# Patient Record
Sex: Male | Born: 1935 | Race: White | Hispanic: No | Marital: Married | Smoking: Never smoker
Health system: Southern US, Community
[De-identification: ages and names within clinical notes are randomized; demographics above are authoritative.]

## PROBLEM LIST (undated history)

## (undated) DIAGNOSIS — H53001 Unspecified amblyopia, right eye: Secondary | ICD-10-CM

## (undated) DIAGNOSIS — E785 Hyperlipidemia, unspecified: Secondary | ICD-10-CM

## (undated) DIAGNOSIS — H50111 Monocular exotropia, right eye: Secondary | ICD-10-CM

## (undated) DIAGNOSIS — D497 Neoplasm of unspecified behavior of endocrine glands and other parts of nervous system: Secondary | ICD-10-CM

## (undated) DIAGNOSIS — E039 Hypothyroidism, unspecified: Secondary | ICD-10-CM

## (undated) DIAGNOSIS — F410 Panic disorder [episodic paroxysmal anxiety] without agoraphobia: Secondary | ICD-10-CM

## (undated) DIAGNOSIS — H5010 Unspecified exotropia: Secondary | ICD-10-CM

## (undated) DIAGNOSIS — C801 Malignant (primary) neoplasm, unspecified: Secondary | ICD-10-CM

## (undated) HISTORY — PX: PROSTATECTOMY: SHX69

## (undated) HISTORY — PX: EYE SURGERY: SHX253

## (undated) HISTORY — PX: TONSILLECTOMY: SUR1361

## (undated) HISTORY — PX: COLONOSCOPY: SHX174

## (undated) HISTORY — PX: TRANSPHENOIDAL / TRANSNASAL HYPOPHYSECTOMY / RESECTION PITUITARY TUMOR: SUR1382

## (undated) HISTORY — PX: WISDOM TOOTH EXTRACTION: SHX21

---

## 1999-05-16 ENCOUNTER — Other Ambulatory Visit: Admission: RE | Admit: 1999-05-16 | Discharge: 1999-05-16 | Payer: Self-pay | Admitting: Urology

## 2002-02-11 ENCOUNTER — Encounter: Admission: RE | Admit: 2002-02-11 | Discharge: 2002-02-11 | Payer: Self-pay | Admitting: Internal Medicine

## 2002-02-11 ENCOUNTER — Encounter: Payer: Self-pay | Admitting: Internal Medicine

## 2005-11-05 ENCOUNTER — Encounter: Admission: RE | Admit: 2005-11-05 | Discharge: 2005-11-05 | Payer: Self-pay | Admitting: Otolaryngology

## 2009-04-08 ENCOUNTER — Inpatient Hospital Stay (HOSPITAL_COMMUNITY)
Admission: RE | Admit: 2009-04-08 | Discharge: 2009-04-15 | Payer: Self-pay | Admitting: Physical Medicine & Rehabilitation

## 2009-04-08 ENCOUNTER — Ambulatory Visit: Payer: Self-pay | Admitting: Physical Medicine & Rehabilitation

## 2009-04-09 ENCOUNTER — Ambulatory Visit: Payer: Self-pay | Admitting: Physical Medicine & Rehabilitation

## 2009-04-12 ENCOUNTER — Ambulatory Visit: Payer: Self-pay | Admitting: Psychology

## 2009-04-20 ENCOUNTER — Encounter
Admission: RE | Admit: 2009-04-20 | Discharge: 2009-07-19 | Payer: Self-pay | Admitting: Physical Medicine & Rehabilitation

## 2009-05-17 ENCOUNTER — Inpatient Hospital Stay (HOSPITAL_COMMUNITY): Admission: EM | Admit: 2009-05-17 | Discharge: 2009-05-20 | Payer: Self-pay | Admitting: Emergency Medicine

## 2011-02-25 LAB — BASIC METABOLIC PANEL
BUN: 12 mg/dL (ref 6–23)
CO2: 26 mEq/L (ref 19–32)
Chloride: 111 mEq/L (ref 96–112)
Glucose, Bld: 93 mg/dL (ref 70–99)
Potassium: 3.3 mEq/L — ABNORMAL LOW (ref 3.5–5.1)
Sodium: 143 mEq/L (ref 135–145)

## 2011-02-25 LAB — CBC
HCT: 37.5 % — ABNORMAL LOW (ref 39.0–52.0)
Hemoglobin: 12.9 g/dL — ABNORMAL LOW (ref 13.0–17.0)
MCHC: 34.5 g/dL (ref 30.0–36.0)
MCV: 86.8 fL (ref 78.0–100.0)
Platelets: 225 10*3/uL (ref 150–400)
RDW: 15.9 % — ABNORMAL HIGH (ref 11.5–15.5)

## 2011-02-26 LAB — BASIC METABOLIC PANEL
Calcium: 8.9 mg/dL (ref 8.4–10.5)
GFR calc Af Amer: >60 mL/min (ref >60)
GFR calc non Af Amer: 54 mL/min — ABNORMAL LOW (ref >60)
Glucose, Bld: 144 mg/dL — ABNORMAL HIGH (ref 70–99)
Sodium: 151 mEq/L — ABNORMAL HIGH (ref 135–145)

## 2011-02-26 LAB — URINALYSIS, ROUTINE W REFLEX MICROSCOPIC
Bilirubin Urine: NEGATIVE
Hgb urine dipstick: NEGATIVE
Ketones, ur: NEGATIVE mg/dL
Nitrite: NEGATIVE
Protein, ur: NEGATIVE mg/dL
Specific Gravity, Urine: 1.01 (ref 1.005–1.030)
Urobilinogen, UA: 0.2 mg/dL (ref 0.0–1.0)

## 2011-02-26 LAB — COMPREHENSIVE METABOLIC PANEL
AST: 26 U/L (ref 0–37)
Albumin: 4.5 g/dL (ref 3.5–5.2)
Alkaline Phosphatase: 94 U/L (ref 39–117)
BUN: 22 mg/dL (ref 6–23)
CO2: 26 mEq/L (ref 19–32)
Chloride: 102 mEq/L (ref 96–112)
GFR calc Af Amer: 54 mL/min — ABNORMAL LOW (ref >60)
GFR calc non Af Amer: 45 mL/min — ABNORMAL LOW (ref >60)
Potassium: 3.5 mEq/L (ref 3.5–5.1)
Total Bilirubin: 1.2 mg/dL (ref 0.3–1.2)

## 2011-02-26 LAB — CBC
HCT: 51.4 % (ref 39.0–52.0)
Hemoglobin: 15.6 g/dL (ref 13.0–17.0)
Platelets: 322 10*3/uL (ref 150–400)
RBC: 5.97 MIL/uL — ABNORMAL HIGH (ref 4.22–5.81)
RDW: 16 % — ABNORMAL HIGH (ref 11.5–15.5)
WBC: 13.4 10*3/uL — ABNORMAL HIGH (ref 4.0–10.5)

## 2011-02-26 LAB — APTT: aPTT: 35 seconds (ref 24–37)

## 2011-02-26 LAB — LIPID PANEL
Cholesterol: 146 mg/dL (ref 0–200)
LDL Cholesterol: 71 mg/dL (ref 0–99)
VLDL: 14 mg/dL (ref 0–40)

## 2011-02-26 LAB — URINE CULTURE

## 2011-02-26 LAB — DIFFERENTIAL
Basophils Absolute: 0.1 10*3/uL (ref 0.0–0.1)
Basophils Relative: 1 % (ref 0–1)
Eosinophils Relative: 0 % (ref 0–5)
Monocytes Absolute: 0.4 10*3/uL (ref 0.1–1.0)

## 2011-02-26 LAB — PROTIME-INR: INR: 1.3 (ref 0.00–1.49)

## 2011-02-26 LAB — HEMOCCULT GUIAC POC 1CARD (OFFICE): Fecal Occult Bld: POSITIVE

## 2011-02-26 LAB — CLOSTRIDIUM DIFFICILE EIA

## 2011-02-26 LAB — STOOL CULTURE

## 2011-02-27 LAB — COMPREHENSIVE METABOLIC PANEL
ALT: 249 U/L — ABNORMAL HIGH (ref 0–53)
AST: 115 U/L — ABNORMAL HIGH (ref 0–37)
CO2: 26 mEq/L (ref 19–32)
Chloride: 111 mEq/L (ref 96–112)
GFR calc Af Amer: >60 mL/min (ref >60)
GFR calc non Af Amer: 58 mL/min — ABNORMAL LOW (ref >60)
Potassium: 4.5 mEq/L (ref 3.5–5.1)
Sodium: 140 mEq/L (ref 135–145)
Total Bilirubin: 1.6 mg/dL — ABNORMAL HIGH (ref 0.3–1.2)

## 2011-02-27 LAB — BASIC METABOLIC PANEL
BUN: 15 mg/dL (ref 6–23)
GFR calc non Af Amer: >60 mL/min (ref >60)
Glucose, Bld: 78 mg/dL (ref 70–99)
Potassium: 4.1 mEq/L (ref 3.5–5.1)

## 2011-02-27 LAB — CBC
MCV: 85.3 fL (ref 78.0–100.0)
RBC: 5.35 MIL/uL (ref 4.22–5.81)
WBC: 9.7 10*3/uL (ref 4.0–10.5)

## 2011-02-27 LAB — DIFFERENTIAL
Basophils Absolute: 0 10*3/uL (ref 0.0–0.1)
Eosinophils Absolute: 0.2 10*3/uL (ref 0.0–0.7)
Eosinophils Relative: 2 % (ref 0–5)
Lymphs Abs: 1.2 10*3/uL (ref 0.7–4.0)

## 2011-02-27 LAB — SODIUM, URINE, RANDOM: Sodium, Ur: 50 mEq/L

## 2011-04-03 NOTE — Discharge Summary (Signed)
NAMEDEO, MEHRINGER NO.:  [id number]   MEDICAL RECORD NO.:  [id number]          PATIENT TYPE:  INP   LOCATION:  6525                         FACILITY:  [facility]   PHYSICIAN:  Candyce Churn, M.D.DATE OF BIRTH:  1930/04/15   DATE OF ADMISSION:  05/17/2009  DATE OF DISCHARGE:  05/20/2009                               DISCHARGE SUMMARY   DISCHARGE DIAGNOSES:  1. Clostridium difficile colitis.  2. Dehydration secondary to Clostridium difficile colitis.  3. Acute renal failure, resolved.  4. Hypotension, resolved.  5. Hypopituitarism, status post pituitary adenoma resection x2.  6. History of hypertension.  7. History of hyperlipidemia.  8. History of diabetes insipidus.  9. Glaucoma.  10.Question of mild alcohol withdrawal.   DISCHARGE MEDICATIONS:  1. Alphagan 0.2% ophthalmic solution 1 drop at each eye b.i.d.  2. Aspirin 81 mg daily.  3. Ativan 0.5 mg p.o. b.i.d. anxiety.  4. Synthroid 100 mcg daily.  5. Crestor 10 mg daily.  6. Diovan 80 mg daily, hold for now.  7. Depo-Testosterone 200 mg IM q.2 weeks.  8. Hydrocortisone 20 mg in the morning and 10 mg in the evening.  9. DDAVP 0.01% nasal spray 1 spray per nostril b.i.d.  10.Flagyl 500 mg p.o. q.8 h. x7 days.  11.Protonix 40 mg daily for 2 weeks, then discontinue.   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  He had a very complicated postoperative course, required an  ICU stay and mechanical ventilation.  Apparently had some possible  seizure activity following surgery, initially was transferred to [facility] for rehab and then home.  For about 2 weeks, he has been  having diarrhea, which he had not reported to his physicians and on the  day prior to admission, developed nausea and vomiting and chills,  and  presented to the emergency room at [facility] where he was  found to be dehydrated and was treated with intravenous fluids.  He was  found to have a C. diff toxin, positive diarrhea, and treated with IV  fluids and IV Flagyl with resolution of diarrhea while hospitalized  during this admission.  He did have some confusion and agitation, which  was felt to possibly be secondary to alcohol withdrawal.  He admits 2  ounces of alcohol daily for many years.  I have never noted to be  extremely heavy drinker in terms of my associate with him as his  physician over the years, but definitely a regular drinker.  He is now  doing well off the Ativan protocol, and is oriented x3, the vital signs  are stable, and he is eating well and well hydrated.   He is tolerating p.o. Flagyl well.   DISCHARGE LABORATORIES:  Labs from May 20, 2009, revealed sodium 143,  potassium 3.3, chloride 111, bicarb 26, glucose 93, BUN 12, and  creatinine 1.2.  White count 10,900, hemoglobin 12.9, and platelet count  of 25,000.  White count on admission was 13,400, hemoglobin 17.5, and  platelet count 322,000.  C. diff toxin on stool from May 17, 2009, was  positive.  Other laboratories include negative stool for occult blood on  May 18, 2009.  Urine culture with no growth on May 17, 2009.  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  Stool culture so far negative for enteric pathogens.  Lipid profile from May 18, 2009, revealed cholesterol 146,  triglycerides 68, HDL 61, LDL 71, and total cholesterol ratio 2.4.   Again, the patient was admitted, hydrated, and treated with intravenous  Flagyl with excellent resolution of diarrhea.  He is eating and drinking  well at discharge.  Blood pressure is slightly low at the time of  discharge and we will keep him off blood pressure medication in the form  of Diovan and continue to encourage p.o. fluids.  We  will plan to see  him back in followup in the office in 4-5 days.      Candyce Churn, M.D.  Electronically Signed     Candyce Churn, M.D.  Electronically Signed    RNG/MEDQ  D:  05/20/2009  T:  05/20/2009  Job:  [id number]

## 2011-04-03 NOTE — Discharge Summary (Signed)
Alexander Yoder, Alexander Yoder NO.:  [id number]   MEDICAL RECORD NO.:  [id number]          PATIENT TYPE:  IPS   LOCATION:  4030                         FACILITY:  [facility]   PHYSICIAN:  Erick Colace, M.D.DATE OF BIRTH:  04-29-1930   DATE OF ADMISSION:  04/08/2009  DATE OF DISCHARGE:                               DISCHARGE SUMMARY   DISCHARGE DIAGNOSES:  1. Recurrent pituitary adenoma with resection, status post      transsphenoidal resection, Mar 28, 2009.  2. Postoperative delirium, resolving.  3. Diabetes insipidus.  4. Empiric intravenous vancomycin and meropenem for suspect pneumonia.  5. Drug rash secondary to Ancef, resolved.  6. Hypothyroidism.  7. Hyperlipidemia.  8. Hypertension.  9. Glaucoma.   This is a 77 year old white male with history of prostate cancer with  resection in 2002 as well as pituitary adenoma with resection in 1997  who presented with radiographic evidence of progression in size of  pituitary tumor to 2.0-2.8 cm extending into the cavernous sinus with  deviation to the right.  Seen by Dr. Zachery Conch at [facility]  Underwent elective transnasal transsphenoidal resection of pituitary  tumor on Mar 28, 2009 at [facility].  Pathology report  consistent with pituitary adenoma.  Developed mild rash postoperatively,  felt to be secondary to Ancef and discontinued.  Noted serial serum  sodium indicated that he was in diabetes insipidus crisis with Endocrine  consulted, placed on hydrocortisone.  Bouts of restlessness, agitation  with VDRF and intubated on Apr 01, 2009.  Placed on broad-spectrum  antibiotics with blood cultures, urine, cerebrospinal fluid culture with  no growth.  He was extubated on Apr 03, 2009.  Infectious Disease  consulted, advised empiric vancomycin and meropenem through Apr 15, 2009.  Slow taper of hydrocortisone.  Venous Doppler studies of lower  extremity on Apr 01, 2009 was negative.  He was  ambulating handheld  assistance with loss of balance.  He was seen by Inpatient Rehab  Services, felt to be a good candidate for comprehensive rehab program.   PAST MEDICAL HISTORY:  See discharge diagnoses.  Remote tobacco.  Occasional alcohol.   ALLERGIES:  ANCEF and PENICILLIN.   SOCIAL HISTORY:  He is married, lives in [place].  He is employed as a  Clinical research associate.  Three-level home, bedroom downstairs, six steps to entry.  Wife  can assist as needed.  He has a daughter in [place].   Functional history prior to admission was independent.  Functional  status upon admission to rehab services was contact, guard, sit to  stand, sit to supine supervision, ambulating 300 feet with handheld  assistance with scissoring gait.   Medications prior to admission were eye drops, Crestor 10 mg daily,  aspirin 81 mg daily, vitamin B12 daily, Depo-Testosterone 200 mg  intramuscularly every 2 weeks, Synthroid 100 mcg daily, vitamin D daily.   PHYSICAL EXAMINATION:  VITAL SIGNS:  Blood pressure 164/75, pulse 90,  temperature 98.8, respirations 20.  GENERAL:  This was an alert male in no acute distress.  Needed some cues  for year and date.  He  did have fair awareness of his situation, but  needed some cues.  Deep tendon reflexes were 2+.  He moves all  extremities.  LUNGS:  Clear to auscultation.  CARDIAC: Regular rate and rhythm.  ABDOMEN:  Soft, nontender.  Good bowel sounds.   REHABILITATION HOSPITAL COURSE:  The patient was admitted to Inpatient  Rehab Services with therapies initiated on a 3-hour daily basis  consisting of physical therapy, occupational therapy, speech therapy,  and rehabilitation nursing.  The following issues were addressed during  the patient's rehabilitation stay.  Pertaining to Mr. Visconti recurrent  pituitary adenoma with resection on Mar 28, 2009, he continued to  improve nicely.  He would follow up with Dr. Zachery Conch at [facility].  Postoperative delirium  had greatly improved, although he still  was needing supervision.  His awareness again had greatly improved with  minimal cues for complex problem solving.  He was advised no driving.  He remained on empiric intravenous antibiotics until Apr 15, 2009.  These were discontinued with PICC line also discontinued.  He remained  afebrile.  No chest pain or shortness of breath throughout his rehab  course.  He continued on hormone supplement for hypothyroidism without  issue.  Crestor were for his hyperlipidemia.  His blood pressure was  well controlled on Diovan.  He continued on his eye drops for glaucoma.  Noted diabetes insipidus as he was weaned from his hydrocortisone.  He  would follow up with Dr. Uvaldo Bristle at [facility] at [phone].  He remained on his hydrocortisone at 20 mg every a.m. and 10 mg every  p.m.  He would be tapered as per Dr. Uvaldo Bristle.  The patient received weekly  collaborative interdisciplinary team conferences to discuss estimated  length of stay, family teaching, and any barriers to discharge.  He was  supervision for bathing, modified independent upper body dressing,  supervision, lower body dressing, modified independent toilet transfers,  modified independent for overall transfers, ambulating greater than 1000  feet with Freida Busman assistive device, needing some cues for safety  considerations, modified independent for steps, intermittent cues for  thought and organization, minimal cues for mild complex problem solving.  He did not meet Dr. Eula Flax during his rehabilitation stay of  Neuropsychology who felt that post he had some mild postoperative  cognitive compromise, felt to be somewhat to due to delirium which had  greatly improved, demonstrated problems with some distractibility.  The  patient's insight and judgment, and reasoning abilities were impaired  secondary to disruption of attention and memory.  He will continue to be  followed as an  outpatient.   Latest labs showed a sodium 146, potassium 4.1, BUN 15, creatinine 1.14.  Hemoglobin 15.8, hematocrit 45.6, platelet 315, WBC of 9.7.  The patient  was discharged to home.   DISCHARGE MEDICATIONS:  1. Hydrocortisone 20 mg a.m. and 10 mg in the p.m.  2. Synthroid 100 mcg daily.  3. Multivitamin daily.  4. Alphagan ophthalmic solution one drop both eyes twice daily.  5. Crestor 10 mg at bedtime.  6. Xalatan ophthalmic solution one drop both eyes at bedtime.  7. Diovan as prior to admission.  8. Depo-Testosterone 200 mg injection every 2 weeks.   The patient's intravenous vancomycin and meropenem were discontinued Apr 15, 2009 as he completed them.  Coverage of PICC line was discontinued.  Diet was regular.   SPECIAL INSTRUCTIONS:  No driving.  No smoking.  Follow up with Dr.  Zachery Conch  at [facility], [phone]; Dr. Uvaldo Bristle, [phone];  Endocrine Services for tapering of hydrocortisone; Dr. Johnella Moloney,  medical management.      Mariam Dollar, P.A.      Erick Colace, M.D.  Electronically Signed    DA/MEDQ  D:  04/14/2009  T:  04/14/2009  Job:  [id number]   cc:   Lowella Curb, M.D.  Jennelle Human, M.D.

## 2011-04-03 NOTE — H&P (Signed)
NAMEHIRAN, LEARD               ACCOUNT NO.:  [id number]   MEDICAL RECORD NO.:  [id number]          PATIENT TYPE:  INP   LOCATION:  1823                         FACILITY:  [facility]   PHYSICIAN:  Michiel Cowboy, MDDATE OF BIRTH:  [birth year]   DATE OF ADMISSION:  05/17/2009  DATE OF DISCHARGE:                              HISTORY & PHYSICAL   PRIMARY CARE [facility]:  Dr. Marden Noble.   CHIEF COMPLAINT:  Nausea, vomiting and diarrhea.   The patient is a 77 year old gentleman with a history of prostate cancer  and a pituitary mass for which he was hospitalized in [place], status post  removal.  There, he had a very complicated course.  He had an allergy to  penicillin which made him extremely sick.  He needed to be in phenobarb  coma for possible seizure like activity, actually not sure if this was  related to penicillin, they thought that perhaps it was.  After his  discharge, he went to the rehab facility here, but has been having  diarrhea for about 2 weeks now.  Although he had doing more or less well  for the past few days, though he also had nausea and vomiting and  overall could hardly keep anything down, some chills, but no fevers.  The diarrhea has persisted at which point Dr. Marden Noble was contacted  and given the patient was not doing any better, recommended for him to  come to emergency department which he did.  In the ED, the patient was  found to be dehydrated, given IV fluids and [facility] was called  for admission.   Otherwise on review of systems there is no chest pain, no shortness of  breath, no bright red blood per rectum.  Otherwise, just is nausea,  vomiting and diarrhea as stated above.  No particular fevers.  The  patient reports losing some weight since his admission.   PAST MEDICAL HISTORY:  1. Prostate cancer.  2. Hypothyroidism.  3. Hypertension.  4. Hyperlipidemia.  5. Status post pituitary tumor removal.  6. History of diabetes  insipidus.  7. Glaucoma.   SOCIAL HISTORY:  The patient does not smoke.  He drinks only occasional  alcohol, but does not abuse alcohol.  He does not abuse drugs.   FAMILY HISTORY:  Noncontributory.   ALLERGIES:  1. PENICILLIN.  2. CEPHALOSPORINS.   MEDICATIONS:  The patient does not remember his meds, but per his  discharge summary from the end of May he is supposed to be taking;  1. Synthroid 100 mcg daily.  2. Multivitamin.  3. Alphagan opthalmic solution 1 drop to both eyes twice daily.  4. Crestor 10 mg p.o. daily.  5. Xalatan opthalmic eye drops to both eyes at bedtime.  6. Diovan, dose unknown.  7. Depo-Testosterone 200 mg injection every 2 weeks.  8  Hydrocortisone 20 in the morning and 10 mg at night.  I am not sure  if he still taking that actually and he cannot quite remember his  medicines now.   PHYSICAL EXAMINATION:  VITAL SIGNS:  Temperature 97.6, blood pressure  98/75, pulse 136, but not down to on 106, respirations 20.  Saturating  100% on room air.  GENERAL:  The patient appears to be in no acute distress.  HEENT:  Head nontraumatic.  Moist mucous membranes.  LUNGS:  Clear to auscultation bilaterally.  HEART:  Regular rate and rhythm.  No murmurs, rubs or gallops.  ABDOMEN:  Soft, nontender, nondistended.  LOWER EXTREMITIES:  Without clubbing, cyanosis or edema.  NEUROLOGIC:  The patient appears to be intact.  SKIN:  Somewhat dry.  Dry mucous membranes.  Otherwise unremarkable.   LABORATORY DATA:  White blood cell count 13.4, hemoglobin 17.5.  Sodium  139, potassium 3.5, creatinine 1.52, lipase 48.  UA unremarkable.  CT scan of the abdomen showing cholelithiasis and  lymphadenopathy, but otherwise unremarkable.  KUB unremarkable, except  for lots of liquid stool in his colon.  Chest x-ray unremarkable.   EKG shows sinus tachycardia, but no evidence of ischemial infarction.  There is some small Q-waves in lead V5-V6, significance of which is  unclear, as  well as lead II, III and aVF.   ASSESSMENT/PLAN:  This is a 77 year old gentleman with past medical  history significant for pituitary adenoma, status post removal and  diabetes insipidus, who presents with nausea, vomiting and diarrhea.   1. Diarrhea, worrisome for Clostridium difficile given hospital      exposure.  We will cover for right now with empiric Flagyl and send      for Clostridium difficile and stool studies.  2. Low blood pressure.  We will hold blood pressure medications.      Given that the patient possibly was supposed to on hydrocortisone      at home, we will give IV stress dose steroids.  3. Hypothyroidism.  Check TSH and continue Synthroid at 100.  4. Nausea and vomiting, etiology not quite clear, but for right now we      will treat symptomatically with Zofran.  Possibly related to      gastroenteritis.  5. Dehydration.  Give IV fluids.  Given that the patient is slightly      hypotensive and tachycardiac, we will put on      step-down.  6. Renal insufficiency/failure, likely secondary to dehydration.  Give      IV fluids and follow.  7. Prophylaxis.  Protonix plus sequential compression devices.      Michiel Cowboy, MD  Electronically Signed     AVD/MEDQ  D:  05/17/2009  T:  05/17/2009  Job:  [id number]   cc:   Candyce Churn, M.D.

## 2011-04-03 NOTE — H&P (Signed)
NAMESUHAN, PACI NO.:  [id number]   MEDICAL RECORD NO.:  [id number]          PATIENT TYPE:  IPS   LOCATION:  4030                         FACILITY:  [facility]   PHYSICIAN:  Ranelle Oyster, M.D.DATE OF BIRTH:  [birth year]   DATE OF ADMISSION:  04/08/2009  DATE OF DISCHARGE:                              HISTORY & PHYSICAL   CHIEF COMPLAINT:  Decreased balance.   PRIMARY CARE PHYSICIAN:  Alexander Churn, MD   NEUROSURGEON:  Dr. Zachery Yoder at [facility].   ENDOCRINOLOGIST:  Dr. Derek Yoder at [facility].   HISTORY OF PRESENT ILLNESS:  This is a 77 year old white male with  history of prostate cancer resection in 2002, as well as pituitary  adenoma and transnasal-transsphenoidal pituitary resection in 1997.  He  presented with radiographic evidence of progression in size of the  pituitary tumor to 2 x 2.8 cm extending into the cavernous sinus with  deviation to the right.  The patient was seen by Dr. Zachery Yoder and  underwent elective transnasal-transsphenoidal resection of the pituitary  tumor on Mar 28, 2009, at [facility].  Path report consistent with  pituitary adenoma.  The patient developed mild rash postoperatively  secondary to Ancef, which was stopped.   Serial sodium labs indicated that the patient was in diabetes insipidus.  Endocrine was consulted placed the patient on hydrocortisone and DDAVP.  The patient had bouts of restlessness and agitation with respiratory  failure developing.  He was intubated on Apr 01, 2009, and placed on  broad-spectrum antibiotics.  Blood cultures, urine cultures, and CSF  cultures were all negative.  He was extubated on Apr 03, 2009.  ID was  consulted and advised empiric coverage with vancomycin and meropenem  through Apr 15, 2009.  He has had slow wean of his hydrocortisone and  serum sodium have been monitored.  DDAVP was stopped.  Dopplers were  negative for DVT.  We were consulted for potential rehab  admission and  after reviewing his information, we felt that he could be a potential  candidate and he was brought here today.   REVIEW OF SYSTEMS:  Notable for decreased hearing, incontinence,  weakness, anxiety, urinary frequency.  Pertinent positives are listed  above and full reviews in the written H&P.   PAST MEDICAL HISTORY:  Positive for:  1. Prostate cancer with radical prostatectomy in 2002.  2. Pituitary tumor with resection in 1997.  3. Hypothyroidism.  4. Glaucoma.  5. Sleep apnea.  6. Decreased hearing.  7. Basal cell skin cancer in 2010.  8. Remote tobacco use, 2-3 ounces of alcohol daily also noted.   FAMILY HISTORY:  Positive for diabetes and prostate cancer in father and  brother.   SOCIAL HISTORY:  The patient is married, lives in [place], and employed  as a Clinical research associate.  He has 3-level home with bedroom downstairs and six steps  to enter.  Wife can assist and daughter lives in [place].   ALLERGIES:  ANCEF and PENICILLIN.   HOME MEDICATIONS:  Alphagan drops, Crestor, Xalatan, aspirin, vitamin  B12, Depo-Testosterone, Synthroid, and vitamin D.   LABORATORY  DATA:  Hemoglobin 16.5, white count 13,000, platelets 338.  Sodium 138, potassium 3.2, BUN 15, creatinine 1.1.   PHYSICAL EXAMINATION:  VITAL SIGNS:  Blood pressure 164/75, pulse is 90,  respiratory rate 20, temperature 98.8.  GENERAL:  The patient is pleasant, alert and oriented x3.  HEENT:  Pupils equal, round, and reactive to light.  Ear, nose, and  throat exam is essentially unremarkable.  NECK:  Supple without JVD or lymphadenopathy.  CHEST: Clear to auscultation bilaterally without wheezes, rales, or  rhonchi.  HEART:  Regular rate and rhythm without murmur, rubs, or gallop.  ABDOMEN:  Soft, nontender.  Bowel sounds are positive.  SKIN:  Mild-to-moderate macular papular rash over the face particularly  at the philtrum of the lip.  NEUROLOGIC:  Cranial nerves II through XII are grossly intact.   Reflexes  are 1+ and sensation is grossly normal in all 4 limbs.  Judgment was  fair.  He had some difficulties with processing and attention, some of  this was hearing, of course.  His orientation was excellent.  Language  skills were intact, although it appeared at times he had some difficulty  with word retrieval.  Memory essentially was fair to poor.  Mood was a  bit anxious.  Strength is near 5/5 in all 4 extremities with decreased  fine motor movement, however.  I did not test the gait today during this  examination as the patient was in bed.   POSTADMISSION PHYSICIAN EVALUATION:  1. Functional deficits secondary to recurrent pituitary adenoma status      post resection, postoperative day #11.  2. The patient is admitted to receive collaborative interdisciplinary      care between physiatrist, rehab nursing staff, and therapy team.  3. The patient's level of medical complexity and substantial therapy      needs in context of that medical necessity cannot be provided at a      lesser intensity of care.  4. The patient has experienced substantial functional loss from his      baseline.  Upon functional assessment within the last 24 hours, the      patient has been contact guard assist for sit to stand, transfers      has been supervision for sit to supine, and gait has been 3 feet      with handheld assistance, scissoring gait.  Cognition has been      supervision to min assist at times.  The patient was independent      prior to arrival.  Judging by the patient's diagnosis, physical      exam, and functional history, he has the potential for long-term      progress, which will result in measurable gains while in inpatient      rehab.  These gains will be of substantial and practical use upon      discharge to home in facilitating mobility and self-care.  5. Physiatrist will provide 24-hour management of medical needs as      well as oversight of the therapy plan/treatment and  provide      guidance as appropriate regarding interaction of the two.  Medical      problem list and plan are listed below.  6. 24-hour rehab nursing will assist in management of the patient's      bowel and bladder function, as well as safety awareness, nutrition,      skin care, and medication administration as well as integration of  therapy concepts and techniques.  7. PT will assess and treat for balance, lower extremity      strengthening, neuromuscular reeducation, cognitive perceptual      training, adaptive equipment, and techniques with goals supervision      to modified independent.  8. OT will assess and treat for upper extremity use and ADLs as well      as adaptive equipment, neuromuscular reeducation, and cognitive      perceptual training with family education, safety awareness, also      major targets.  Goals are supervision to modified independent.  9. Speech language pathology will assess and treat for cognitive      deficits with goal of modified independent.  10.Case management and social worker will assess and treat for      psychosocial issues and discharge planning.  11.Team conferences will be held weekly to assess progress towards      goals and to determine barriers of discharge.  12.The patient has demonstrated sufficient medical stability and      exercise capacity to tolerate at least 3 hours of therapy per day      at least 5 days per week.  13.Estimated length of stay is 5-7 days.  Prognosis is good.   MEDICAL PROBLEM LIST:   1. Diabetes insipidus:  Sodium is normalizing with last reading at      138.  We will continue weaning hydrocortisone as able.  2. ID:  IV vancomycin and meropenem until Apr 15, 2009.  All cultures      have been negative.  No active signs of infection on exam today.      We will follow serially.  3. Facial rash:  This is resolving after discontinuing Ancef.  4. Hypothyroidism:  Synthroid.  5. Lipids:  Crestor.  6. Blood  pressure:  Maintain Diovan 100 mg daily.  Blood pressure is      borderline today.  We will follow on a serial basis and adjust      medication as appropriate.  7. Glaucoma:  Continue Xalatan and Alphagan eyedrops.  8. Hypokalemia:  We will check admission labs on Monday.  9. The bladder:  We will discontinue Foley catheter and begin voiding      trial.  The patient may have substantial urine output due to his      diabetes insipidus, however, the diabetes insipidus does appear to      be improving from a laboratory standpoint.      Ranelle Oyster, M.D.  Electronically Signed     ZTS/MEDQ  D:  04/08/2009  T:  04/09/2009  Job:  [id number]   cc:   Alexander Yoder, M.D.  Velna Ochs

## 2011-11-29 DIAGNOSIS — J3489 Other specified disorders of nose and nasal sinuses: Secondary | ICD-10-CM | POA: Diagnosis not present

## 2011-11-29 DIAGNOSIS — J069 Acute upper respiratory infection, unspecified: Secondary | ICD-10-CM | POA: Diagnosis not present

## 2011-12-02 ENCOUNTER — Emergency Department (HOSPITAL_COMMUNITY)
Admission: EM | Admit: 2011-12-02 | Discharge: 2011-12-03 | Disposition: A | Discharge status: Home / Self Care | Payer: Medicare Other | Attending: Emergency Medicine | Admitting: Emergency Medicine

## 2011-12-02 ENCOUNTER — Encounter (HOSPITAL_COMMUNITY): Payer: Self-pay | Admitting: *Deleted

## 2011-12-02 DIAGNOSIS — R112 Nausea with vomiting, unspecified: Secondary | ICD-10-CM | POA: Insufficient documentation

## 2011-12-02 DIAGNOSIS — R42 Dizziness and giddiness: Secondary | ICD-10-CM | POA: Diagnosis not present

## 2011-12-02 DIAGNOSIS — K047 Periapical abscess without sinus: Secondary | ICD-10-CM

## 2011-12-02 DIAGNOSIS — R404 Transient alteration of awareness: Secondary | ICD-10-CM | POA: Diagnosis not present

## 2011-12-02 DIAGNOSIS — K044 Acute apical periodontitis of pulpal origin: Secondary | ICD-10-CM | POA: Diagnosis not present

## 2011-12-02 HISTORY — DX: Neoplasm of unspecified behavior of endocrine glands and other parts of nervous system: D49.7

## 2011-12-02 HISTORY — DX: Panic disorder (episodic paroxysmal anxiety): F41.0

## 2011-12-02 NOTE — ED Notes (Signed)
XBJ:[id number]<NF> Expected date:<BR> Expected time:10:31 PM<BR> Means of arrival:Ambulance<BR> Comments:<BR> N/V, dehydration

## 2011-12-02 NOTE — ED Notes (Signed)
Pt. Brought in by EMS from home. Pt. CO left-sided dental pain x4-5 days. Reports seeing PCP on Thursday because the pt. Thought it was a "sinus infection"; patient was told it was not a sinus infection.

## 2011-12-02 NOTE — ED Notes (Signed)
Pt. Currently denies pain at this time. Reports left-sided dental pain, pta x 4-5 days. Now complains of nausea.

## 2011-12-03 DIAGNOSIS — K044 Acute apical periodontitis of pulpal origin: Secondary | ICD-10-CM | POA: Diagnosis not present

## 2011-12-03 DIAGNOSIS — R112 Nausea with vomiting, unspecified: Secondary | ICD-10-CM | POA: Diagnosis not present

## 2011-12-03 MED ORDER — HYDROCODONE-ACETAMINOPHEN 5-500 MG PO TABS: 1.0000 | ORAL_TABLET | Freq: Four times a day (QID) | ORAL | Status: AC | PRN

## 2011-12-03 MED ORDER — HYDROCODONE-ACETAMINOPHEN 5-325 MG PO TABS
2.0000 | ORAL_TABLET | Freq: Once | ORAL | Status: AC
  Administered 2011-12-03: 2 via ORAL
  Filled 2011-12-03: qty 2

## 2011-12-03 MED ORDER — ONDANSETRON 4 MG PO TBDP: 4.0000 mg | ORAL_TABLET | Freq: Three times a day (TID) | ORAL | Status: AC | PRN

## 2011-12-03 MED ORDER — ONDANSETRON 8 MG PO TBDP
8.0000 mg | ORAL_TABLET | Freq: Once | ORAL | Status: AC
  Administered 2011-12-03: 8 mg via ORAL
  Filled 2011-12-03: qty 1

## 2011-12-03 MED ORDER — CLINDAMYCIN HCL 150 MG PO CAPS: 300.0000 mg | ORAL_CAPSULE | Freq: Three times a day (TID) | ORAL | Status: AC

## 2011-12-03 NOTE — ED Provider Notes (Signed)
History     CSN: [id number]  Arrival date & time 12/02/11  2232   First MD Initiated Contact with Patient 12/02/11 2351      Chief Complaint  Patient presents with  . Dental Pain    (Consider location/radiation/quality/duration/timing/severity/associated sxs/prior treatment) HPI Comments: Patient presents with worsening lower jaw pain. He was recently seen by his primary doctor and given Afrin for upper respiratory allergy symptoms with nasal congestion and drainage. Over the last 24 hours he has developed gradual onset, gradually worsening left lower jaw pain. This is associated with mild swelling of the jaw and nausea. It is worse with palpation. He has taken over-the-counter medications without relief prior to arrival. His spouse gave him a dose of Klonopin because he was appearing anxious and this made him nauseated, dizzy and the patient family reports that he had a possible syncopal episode while sitting down. This was brief, he is returned to his baseline and denies chest pain, shortness of breath, palpitations. He does have a dentist that he can followup with in the morning  Patient is a 78 y.o. male presenting with tooth pain. The history is provided by the patient and a relative.  Dental Pain   Past Medical History  Diagnosis Date  . Anxiety attack   . Glaucoma   . Pituitary tumor     History reviewed. No pertinent past surgical history.  History reviewed. No pertinent family history.  History  Substance Use Topics  . Smoking status: Never Smoker   . Smokeless tobacco: Not on file  . Alcohol Use: No      Review of Systems  All other systems reviewed and are negative.    Allergies  Penicillins  Home Medications   Current Outpatient Rx  Name Route Sig Dispense Refill  . ACETAMINOPHEN 500 MG PO TABS Oral Take 500 mg by mouth every 6 (six) hours as needed.    Marland Kitchen BIMATOPROST 0.01 % OP SOLN  1 drop at bedtime.    Marland Kitchen BRIMONIDINE TARTRATE 0.15 % OP SOLN  1 drop  3 (three) times daily.    Marland Kitchen CETIRIZINE HCL 10 MG PO TABS Oral Take 10 mg by mouth daily.    Marland Kitchen CLONAZEPAM 1 MG PO TABS Oral Take 1 mg by mouth at bedtime as needed. Anxiety    . DESMOPRESSIN ACETATE 0.2 MG PO TABS Oral Take 0.2 mg by mouth 2 (two) times daily.    Marland Kitchen FLUTICASONE PROPIONATE 50 MCG/ACT NA SUSP Nasal Place 2 sprays into the nose daily.    Marland Kitchen HYDROCORTISONE 10 MG PO TABS Oral Take 10 mg by mouth 2 (two) times daily.    Marland Kitchen ROSUVASTATIN CALCIUM 5 MG PO TABS Oral Take 5 mg by mouth daily.    Marland Kitchen CLINDAMYCIN HCL 150 MG PO CAPS Oral Take 2 capsules (300 mg total) by mouth 3 (three) times daily. May dispense as 150mg  capsules 60 capsule 0  . HYDROCODONE-ACETAMINOPHEN 5-500 MG PO TABS Oral Take 1 tablet by mouth every 6 (six) hours as needed for pain. 15 tablet 0  . ONDANSETRON 4 MG PO TBDP Oral Take 1 tablet (4 mg total) by mouth every 8 (eight) hours as needed for nausea. 10 tablet 0    BP 105/75  Pulse 79  Temp(Src) 98.7 F (37.1 C) (Oral)  Resp 18  SpO2 95%  Physical Exam  Nursing note and vitals reviewed. Constitutional: He appears well-developed and well-nourished. No distress.  HENT:  Head: Normocephalic and atraumatic.  Mouth/Throat: No oropharyngeal  exudate.       No nasal discharge or turbinate swelling, oropharynx is clear, tender to percussion over the left second molar, there is gingival tenderness as well but no fluctuance, induration or swelling.  Eyes: Conjunctivae and EOM are normal. Pupils are equal, round, and reactive to light. Right eye exhibits no discharge. Left eye exhibits no discharge. No scleral icterus.  Neck: Normal range of motion. Neck supple. No JVD present. No thyromegaly present.  Cardiovascular: Normal rate, regular rhythm, normal heart sounds and intact distal pulses.  Exam reveals no gallop and no friction rub.   No murmur heard. Pulmonary/Chest: Effort normal and breath sounds normal. No respiratory distress. He has no wheezes. He has no rales.    Abdominal: Soft. Bowel sounds are normal. He exhibits no distension and no mass. There is no tenderness.  Musculoskeletal: Normal range of motion. He exhibits no edema and no tenderness.  Lymphadenopathy:    He has no cervical adenopathy.  Neurological: He is alert. Coordination normal.  Skin: Skin is warm and dry. No rash noted. No erythema.  Psychiatric: He has a normal mood and affect. His behavior is normal.    ED Course  Procedures (including critical care time)  Labs Reviewed - No data to display No results found.   1. Dental infection   2. Nausea and vomiting       MDM  Overall patient appears well, has vital signs that are reassuring and normal and no longer feels dizzy, lightheaded. Will give Zofran ODT, hydrocodone for pain, I have offered him intravenous clindamycin to get him started but he has declined and wants to fill his prescription at the pharmacy. His family members are with him and will make sure that he follows up in the morning with dentistry. I have given him indications for return and he has expressed his understanding.        Vida Roller, MD 12/03/11 0040

## 2011-12-18 DIAGNOSIS — R279 Unspecified lack of coordination: Secondary | ICD-10-CM | POA: Diagnosis not present

## 2011-12-18 DIAGNOSIS — R32 Unspecified urinary incontinence: Secondary | ICD-10-CM | POA: Diagnosis not present

## 2011-12-18 DIAGNOSIS — M6281 Muscle weakness (generalized): Secondary | ICD-10-CM | POA: Diagnosis not present

## 2011-12-19 DIAGNOSIS — Z79899 Other long term (current) drug therapy: Secondary | ICD-10-CM | POA: Diagnosis not present

## 2011-12-19 DIAGNOSIS — E291 Testicular hypofunction: Secondary | ICD-10-CM | POA: Diagnosis not present

## 2011-12-19 DIAGNOSIS — E782 Mixed hyperlipidemia: Secondary | ICD-10-CM | POA: Diagnosis not present

## 2011-12-19 DIAGNOSIS — I10 Essential (primary) hypertension: Secondary | ICD-10-CM | POA: Diagnosis not present

## 2011-12-19 DIAGNOSIS — E559 Vitamin D deficiency, unspecified: Secondary | ICD-10-CM | POA: Diagnosis not present

## 2011-12-19 DIAGNOSIS — E538 Deficiency of other specified B group vitamins: Secondary | ICD-10-CM | POA: Diagnosis not present

## 2011-12-19 DIAGNOSIS — E232 Diabetes insipidus: Secondary | ICD-10-CM | POA: Diagnosis not present

## 2011-12-19 DIAGNOSIS — E039 Hypothyroidism, unspecified: Secondary | ICD-10-CM | POA: Diagnosis not present

## 2011-12-26 DIAGNOSIS — R279 Unspecified lack of coordination: Secondary | ICD-10-CM | POA: Diagnosis not present

## 2011-12-26 DIAGNOSIS — C61 Malignant neoplasm of prostate: Secondary | ICD-10-CM | POA: Diagnosis not present

## 2011-12-26 DIAGNOSIS — R32 Unspecified urinary incontinence: Secondary | ICD-10-CM | POA: Diagnosis not present

## 2011-12-26 DIAGNOSIS — M6281 Muscle weakness (generalized): Secondary | ICD-10-CM | POA: Diagnosis not present

## 2012-01-09 DIAGNOSIS — A088 Other specified intestinal infections: Secondary | ICD-10-CM | POA: Diagnosis not present

## 2012-01-11 DIAGNOSIS — J209 Acute bronchitis, unspecified: Secondary | ICD-10-CM | POA: Diagnosis not present

## 2012-02-20 DIAGNOSIS — R32 Unspecified urinary incontinence: Secondary | ICD-10-CM | POA: Diagnosis not present

## 2012-02-20 DIAGNOSIS — C61 Malignant neoplasm of prostate: Secondary | ICD-10-CM | POA: Diagnosis not present

## 2012-02-20 DIAGNOSIS — R279 Unspecified lack of coordination: Secondary | ICD-10-CM | POA: Diagnosis not present

## 2012-02-20 DIAGNOSIS — M6281 Muscle weakness (generalized): Secondary | ICD-10-CM | POA: Diagnosis not present

## 2012-03-13 DIAGNOSIS — J329 Chronic sinusitis, unspecified: Secondary | ICD-10-CM | POA: Diagnosis not present

## 2012-03-13 DIAGNOSIS — J309 Allergic rhinitis, unspecified: Secondary | ICD-10-CM | POA: Diagnosis not present

## 2012-03-24 DIAGNOSIS — R32 Unspecified urinary incontinence: Secondary | ICD-10-CM | POA: Diagnosis not present

## 2012-03-24 DIAGNOSIS — C61 Malignant neoplasm of prostate: Secondary | ICD-10-CM | POA: Diagnosis not present

## 2012-03-24 DIAGNOSIS — M6281 Muscle weakness (generalized): Secondary | ICD-10-CM | POA: Diagnosis not present

## 2012-03-24 DIAGNOSIS — R279 Unspecified lack of coordination: Secondary | ICD-10-CM | POA: Diagnosis not present

## 2012-03-25 DIAGNOSIS — J309 Allergic rhinitis, unspecified: Secondary | ICD-10-CM | POA: Diagnosis not present

## 2012-04-02 DIAGNOSIS — N529 Male erectile dysfunction, unspecified: Secondary | ICD-10-CM | POA: Diagnosis not present

## 2012-04-02 DIAGNOSIS — E236 Other disorders of pituitary gland: Secondary | ICD-10-CM | POA: Diagnosis not present

## 2012-04-02 DIAGNOSIS — C61 Malignant neoplasm of prostate: Secondary | ICD-10-CM | POA: Diagnosis not present

## 2012-04-02 DIAGNOSIS — R32 Unspecified urinary incontinence: Secondary | ICD-10-CM | POA: Diagnosis not present

## 2012-04-28 DIAGNOSIS — M6281 Muscle weakness (generalized): Secondary | ICD-10-CM | POA: Diagnosis not present

## 2012-04-28 DIAGNOSIS — R32 Unspecified urinary incontinence: Secondary | ICD-10-CM | POA: Diagnosis not present

## 2012-04-28 DIAGNOSIS — R279 Unspecified lack of coordination: Secondary | ICD-10-CM | POA: Diagnosis not present

## 2012-04-28 DIAGNOSIS — C61 Malignant neoplasm of prostate: Secondary | ICD-10-CM | POA: Diagnosis not present

## 2012-06-16 DIAGNOSIS — H409 Unspecified glaucoma: Secondary | ICD-10-CM | POA: Diagnosis not present

## 2012-06-16 DIAGNOSIS — H4011X0 Primary open-angle glaucoma, stage unspecified: Secondary | ICD-10-CM | POA: Diagnosis not present

## 2012-06-23 DIAGNOSIS — R32 Unspecified urinary incontinence: Secondary | ICD-10-CM | POA: Diagnosis not present

## 2012-06-23 DIAGNOSIS — M6281 Muscle weakness (generalized): Secondary | ICD-10-CM | POA: Diagnosis not present

## 2012-06-23 DIAGNOSIS — R279 Unspecified lack of coordination: Secondary | ICD-10-CM | POA: Diagnosis not present

## 2012-07-02 DIAGNOSIS — E039 Hypothyroidism, unspecified: Secondary | ICD-10-CM | POA: Diagnosis not present

## 2012-07-02 DIAGNOSIS — E782 Mixed hyperlipidemia: Secondary | ICD-10-CM | POA: Diagnosis not present

## 2012-07-02 DIAGNOSIS — Z1331 Encounter for screening for depression: Secondary | ICD-10-CM | POA: Diagnosis not present

## 2012-07-02 DIAGNOSIS — E291 Testicular hypofunction: Secondary | ICD-10-CM | POA: Diagnosis not present

## 2012-07-02 DIAGNOSIS — Z0000 Encounter for general adult medical examination without abnormal findings: Secondary | ICD-10-CM | POA: Diagnosis not present

## 2012-07-02 DIAGNOSIS — I10 Essential (primary) hypertension: Secondary | ICD-10-CM | POA: Diagnosis not present

## 2012-07-02 DIAGNOSIS — E559 Vitamin D deficiency, unspecified: Secondary | ICD-10-CM | POA: Diagnosis not present

## 2012-07-02 DIAGNOSIS — J309 Allergic rhinitis, unspecified: Secondary | ICD-10-CM | POA: Diagnosis not present

## 2012-08-18 DIAGNOSIS — R109 Unspecified abdominal pain: Secondary | ICD-10-CM | POA: Diagnosis not present

## 2012-09-19 DIAGNOSIS — Z23 Encounter for immunization: Secondary | ICD-10-CM | POA: Diagnosis not present

## 2012-09-22 DIAGNOSIS — L570 Actinic keratosis: Secondary | ICD-10-CM | POA: Diagnosis not present

## 2012-09-22 DIAGNOSIS — Z808 Family history of malignant neoplasm of other organs or systems: Secondary | ICD-10-CM | POA: Diagnosis not present

## 2012-09-22 DIAGNOSIS — L919 Hypertrophic disorder of the skin, unspecified: Secondary | ICD-10-CM | POA: Diagnosis not present

## 2012-09-22 DIAGNOSIS — L820 Inflamed seborrheic keratosis: Secondary | ICD-10-CM | POA: Diagnosis not present

## 2012-09-29 DIAGNOSIS — H409 Unspecified glaucoma: Secondary | ICD-10-CM | POA: Diagnosis not present

## 2012-09-29 DIAGNOSIS — H4011X0 Primary open-angle glaucoma, stage unspecified: Secondary | ICD-10-CM | POA: Diagnosis not present

## 2012-10-01 DIAGNOSIS — C61 Malignant neoplasm of prostate: Secondary | ICD-10-CM | POA: Diagnosis not present

## 2012-10-08 DIAGNOSIS — C61 Malignant neoplasm of prostate: Secondary | ICD-10-CM | POA: Diagnosis not present

## 2012-10-08 DIAGNOSIS — N529 Male erectile dysfunction, unspecified: Secondary | ICD-10-CM | POA: Diagnosis not present

## 2012-10-08 DIAGNOSIS — R32 Unspecified urinary incontinence: Secondary | ICD-10-CM | POA: Diagnosis not present

## 2013-02-02 DIAGNOSIS — H409 Unspecified glaucoma: Secondary | ICD-10-CM | POA: Diagnosis not present

## 2013-02-02 DIAGNOSIS — H4011X0 Primary open-angle glaucoma, stage unspecified: Secondary | ICD-10-CM | POA: Diagnosis not present

## 2013-06-08 DIAGNOSIS — H4011X0 Primary open-angle glaucoma, stage unspecified: Secondary | ICD-10-CM | POA: Diagnosis not present

## 2013-06-08 DIAGNOSIS — H409 Unspecified glaucoma: Secondary | ICD-10-CM | POA: Diagnosis not present

## 2013-07-14 DIAGNOSIS — D352 Benign neoplasm of pituitary gland: Secondary | ICD-10-CM | POA: Diagnosis not present

## 2013-07-14 DIAGNOSIS — G9389 Other specified disorders of brain: Secondary | ICD-10-CM | POA: Diagnosis not present

## 2013-07-14 DIAGNOSIS — G479 Sleep disorder, unspecified: Secondary | ICD-10-CM | POA: Diagnosis not present

## 2013-07-22 DIAGNOSIS — H905 Unspecified sensorineural hearing loss: Secondary | ICD-10-CM | POA: Diagnosis not present

## 2013-07-27 DIAGNOSIS — E232 Diabetes insipidus: Secondary | ICD-10-CM | POA: Diagnosis not present

## 2013-07-27 DIAGNOSIS — I10 Essential (primary) hypertension: Secondary | ICD-10-CM | POA: Diagnosis not present

## 2013-07-27 DIAGNOSIS — E291 Testicular hypofunction: Secondary | ICD-10-CM | POA: Diagnosis not present

## 2013-07-27 DIAGNOSIS — H4050X0 Glaucoma secondary to other eye disorders, unspecified eye, stage unspecified: Secondary | ICD-10-CM | POA: Diagnosis not present

## 2013-07-27 DIAGNOSIS — G4701 Insomnia due to medical condition: Secondary | ICD-10-CM | POA: Diagnosis not present

## 2013-07-27 DIAGNOSIS — H9190 Unspecified hearing loss, unspecified ear: Secondary | ICD-10-CM | POA: Diagnosis not present

## 2013-07-27 DIAGNOSIS — E230 Hypopituitarism: Secondary | ICD-10-CM | POA: Diagnosis not present

## 2013-09-19 DIAGNOSIS — Z23 Encounter for immunization: Secondary | ICD-10-CM | POA: Diagnosis not present

## 2013-10-05 DIAGNOSIS — L259 Unspecified contact dermatitis, unspecified cause: Secondary | ICD-10-CM | POA: Diagnosis not present

## 2013-10-05 DIAGNOSIS — C44621 Squamous cell carcinoma of skin of unspecified upper limb, including shoulder: Secondary | ICD-10-CM | POA: Diagnosis not present

## 2013-10-05 DIAGNOSIS — B351 Tinea unguium: Secondary | ICD-10-CM | POA: Diagnosis not present

## 2013-10-05 DIAGNOSIS — D0460 Carcinoma in situ of skin of unspecified upper limb, including shoulder: Secondary | ICD-10-CM | POA: Diagnosis not present

## 2013-10-05 DIAGNOSIS — D1801 Hemangioma of skin and subcutaneous tissue: Secondary | ICD-10-CM | POA: Diagnosis not present

## 2013-10-07 DIAGNOSIS — Z0000 Encounter for general adult medical examination without abnormal findings: Secondary | ICD-10-CM | POA: Diagnosis not present

## 2013-10-12 DIAGNOSIS — C61 Malignant neoplasm of prostate: Secondary | ICD-10-CM | POA: Diagnosis not present

## 2013-10-12 DIAGNOSIS — H409 Unspecified glaucoma: Secondary | ICD-10-CM | POA: Diagnosis not present

## 2013-10-12 DIAGNOSIS — H4011X0 Primary open-angle glaucoma, stage unspecified: Secondary | ICD-10-CM | POA: Diagnosis not present

## 2013-10-14 DIAGNOSIS — I10 Essential (primary) hypertension: Secondary | ICD-10-CM | POA: Diagnosis not present

## 2013-10-14 DIAGNOSIS — D352 Benign neoplasm of pituitary gland: Secondary | ICD-10-CM | POA: Diagnosis not present

## 2013-10-14 DIAGNOSIS — Z0000 Encounter for general adult medical examination without abnormal findings: Secondary | ICD-10-CM | POA: Diagnosis not present

## 2013-10-14 DIAGNOSIS — H9190 Unspecified hearing loss, unspecified ear: Secondary | ICD-10-CM | POA: Diagnosis not present

## 2013-10-14 DIAGNOSIS — G4701 Insomnia due to medical condition: Secondary | ICD-10-CM | POA: Diagnosis not present

## 2013-10-14 DIAGNOSIS — Z79899 Other long term (current) drug therapy: Secondary | ICD-10-CM | POA: Diagnosis not present

## 2013-10-14 DIAGNOSIS — Z1331 Encounter for screening for depression: Secondary | ICD-10-CM | POA: Diagnosis not present

## 2013-10-14 DIAGNOSIS — E559 Vitamin D deficiency, unspecified: Secondary | ICD-10-CM | POA: Diagnosis not present

## 2013-10-14 DIAGNOSIS — E538 Deficiency of other specified B group vitamins: Secondary | ICD-10-CM | POA: Diagnosis not present

## 2013-10-14 DIAGNOSIS — E232 Diabetes insipidus: Secondary | ICD-10-CM | POA: Diagnosis not present

## 2013-10-14 DIAGNOSIS — E230 Hypopituitarism: Secondary | ICD-10-CM | POA: Diagnosis not present

## 2013-10-24 DIAGNOSIS — K521 Toxic gastroenteritis and colitis: Secondary | ICD-10-CM | POA: Diagnosis not present

## 2013-10-24 DIAGNOSIS — R112 Nausea with vomiting, unspecified: Secondary | ICD-10-CM | POA: Diagnosis not present

## 2013-10-24 DIAGNOSIS — J209 Acute bronchitis, unspecified: Secondary | ICD-10-CM | POA: Diagnosis not present

## 2013-10-26 DIAGNOSIS — J069 Acute upper respiratory infection, unspecified: Secondary | ICD-10-CM | POA: Diagnosis not present

## 2013-11-16 DIAGNOSIS — E230 Hypopituitarism: Secondary | ICD-10-CM | POA: Diagnosis not present

## 2013-11-21 DIAGNOSIS — J069 Acute upper respiratory infection, unspecified: Secondary | ICD-10-CM | POA: Diagnosis not present

## 2013-11-21 DIAGNOSIS — R110 Nausea: Secondary | ICD-10-CM | POA: Diagnosis not present

## 2013-11-25 DIAGNOSIS — J069 Acute upper respiratory infection, unspecified: Secondary | ICD-10-CM | POA: Diagnosis not present

## 2013-11-25 DIAGNOSIS — R059 Cough, unspecified: Secondary | ICD-10-CM | POA: Diagnosis not present

## 2013-11-25 DIAGNOSIS — R05 Cough: Secondary | ICD-10-CM | POA: Diagnosis not present

## 2013-12-09 DIAGNOSIS — N529 Male erectile dysfunction, unspecified: Secondary | ICD-10-CM | POA: Diagnosis not present

## 2013-12-09 DIAGNOSIS — C61 Malignant neoplasm of prostate: Secondary | ICD-10-CM | POA: Diagnosis not present

## 2013-12-09 DIAGNOSIS — R32 Unspecified urinary incontinence: Secondary | ICD-10-CM | POA: Diagnosis not present

## 2013-12-21 DIAGNOSIS — E232 Diabetes insipidus: Secondary | ICD-10-CM | POA: Diagnosis not present

## 2013-12-21 DIAGNOSIS — E230 Hypopituitarism: Secondary | ICD-10-CM | POA: Diagnosis not present

## 2013-12-21 DIAGNOSIS — D352 Benign neoplasm of pituitary gland: Secondary | ICD-10-CM | POA: Diagnosis not present

## 2013-12-21 DIAGNOSIS — D353 Benign neoplasm of craniopharyngeal duct: Secondary | ICD-10-CM | POA: Diagnosis not present

## 2014-02-15 DIAGNOSIS — H4011X0 Primary open-angle glaucoma, stage unspecified: Secondary | ICD-10-CM | POA: Diagnosis not present

## 2014-02-15 DIAGNOSIS — H409 Unspecified glaucoma: Secondary | ICD-10-CM | POA: Diagnosis not present

## 2014-04-19 DIAGNOSIS — D352 Benign neoplasm of pituitary gland: Secondary | ICD-10-CM | POA: Diagnosis not present

## 2014-04-19 DIAGNOSIS — E230 Hypopituitarism: Secondary | ICD-10-CM | POA: Diagnosis not present

## 2014-04-19 DIAGNOSIS — D353 Benign neoplasm of craniopharyngeal duct: Secondary | ICD-10-CM | POA: Diagnosis not present

## 2014-04-19 DIAGNOSIS — E162 Hypoglycemia, unspecified: Secondary | ICD-10-CM | POA: Diagnosis not present

## 2014-04-19 DIAGNOSIS — E232 Diabetes insipidus: Secondary | ICD-10-CM | POA: Diagnosis not present

## 2014-04-19 DIAGNOSIS — D751 Secondary polycythemia: Secondary | ICD-10-CM | POA: Diagnosis not present

## 2014-04-26 DIAGNOSIS — J029 Acute pharyngitis, unspecified: Secondary | ICD-10-CM | POA: Diagnosis not present

## 2014-04-26 DIAGNOSIS — R509 Fever, unspecified: Secondary | ICD-10-CM | POA: Diagnosis not present

## 2014-04-26 DIAGNOSIS — R110 Nausea: Secondary | ICD-10-CM | POA: Diagnosis not present

## 2014-06-09 DIAGNOSIS — E232 Diabetes insipidus: Secondary | ICD-10-CM | POA: Diagnosis not present

## 2014-06-09 DIAGNOSIS — E230 Hypopituitarism: Secondary | ICD-10-CM | POA: Diagnosis not present

## 2014-06-21 DIAGNOSIS — H409 Unspecified glaucoma: Secondary | ICD-10-CM | POA: Diagnosis not present

## 2014-06-21 DIAGNOSIS — H4011X0 Primary open-angle glaucoma, stage unspecified: Secondary | ICD-10-CM | POA: Diagnosis not present

## 2014-07-28 DIAGNOSIS — E039 Hypothyroidism, unspecified: Secondary | ICD-10-CM | POA: Diagnosis not present

## 2014-07-28 DIAGNOSIS — E291 Testicular hypofunction: Secondary | ICD-10-CM | POA: Diagnosis not present

## 2014-08-24 DIAGNOSIS — E230 Hypopituitarism: Secondary | ICD-10-CM | POA: Diagnosis not present

## 2014-08-24 DIAGNOSIS — D751 Secondary polycythemia: Secondary | ICD-10-CM | POA: Diagnosis not present

## 2014-08-24 DIAGNOSIS — Z23 Encounter for immunization: Secondary | ICD-10-CM | POA: Diagnosis not present

## 2014-08-24 DIAGNOSIS — M545 Low back pain: Secondary | ICD-10-CM | POA: Diagnosis not present

## 2014-08-24 DIAGNOSIS — E232 Diabetes insipidus: Secondary | ICD-10-CM | POA: Diagnosis not present

## 2014-08-25 ENCOUNTER — Other Ambulatory Visit: Payer: Self-pay | Admitting: Internal Medicine

## 2014-08-25 ENCOUNTER — Ambulatory Visit
Admission: RE | Admit: 2014-08-25 | Discharge: 2014-08-25 | Disposition: A | Discharge status: Home / Self Care | Payer: Medicare Other | Source: Ambulatory Visit | Attending: Internal Medicine | Admitting: Internal Medicine

## 2014-08-25 DIAGNOSIS — M545 Low back pain: Secondary | ICD-10-CM

## 2014-08-25 DIAGNOSIS — S298XXA Other specified injuries of thorax, initial encounter: Secondary | ICD-10-CM | POA: Diagnosis not present

## 2014-08-26 DIAGNOSIS — S335XXA Sprain of ligaments of lumbar spine, initial encounter: Secondary | ICD-10-CM | POA: Diagnosis not present

## 2014-10-25 DIAGNOSIS — H4011X3 Primary open-angle glaucoma, severe stage: Secondary | ICD-10-CM | POA: Diagnosis not present

## 2014-12-07 DIAGNOSIS — D352 Benign neoplasm of pituitary gland: Secondary | ICD-10-CM | POA: Diagnosis not present

## 2014-12-14 DIAGNOSIS — D352 Benign neoplasm of pituitary gland: Secondary | ICD-10-CM | POA: Diagnosis not present

## 2014-12-15 DIAGNOSIS — F411 Generalized anxiety disorder: Secondary | ICD-10-CM | POA: Diagnosis not present

## 2014-12-15 DIAGNOSIS — I10 Essential (primary) hypertension: Secondary | ICD-10-CM | POA: Diagnosis not present

## 2014-12-15 DIAGNOSIS — F430 Acute stress reaction: Secondary | ICD-10-CM | POA: Diagnosis not present

## 2015-01-11 DIAGNOSIS — H9190 Unspecified hearing loss, unspecified ear: Secondary | ICD-10-CM | POA: Diagnosis not present

## 2015-01-11 DIAGNOSIS — F439 Reaction to severe stress, unspecified: Secondary | ICD-10-CM | POA: Diagnosis not present

## 2015-02-03 DIAGNOSIS — D225 Melanocytic nevi of trunk: Secondary | ICD-10-CM | POA: Diagnosis not present

## 2015-02-03 DIAGNOSIS — Q828 Other specified congenital malformations of skin: Secondary | ICD-10-CM | POA: Diagnosis not present

## 2015-02-03 DIAGNOSIS — D1801 Hemangioma of skin and subcutaneous tissue: Secondary | ICD-10-CM | POA: Diagnosis not present

## 2015-02-03 DIAGNOSIS — L570 Actinic keratosis: Secondary | ICD-10-CM | POA: Diagnosis not present

## 2015-02-03 DIAGNOSIS — L565 Disseminated superficial actinic porokeratosis (DSAP): Secondary | ICD-10-CM | POA: Diagnosis not present

## 2015-02-03 DIAGNOSIS — L821 Other seborrheic keratosis: Secondary | ICD-10-CM | POA: Diagnosis not present

## 2015-02-03 DIAGNOSIS — D485 Neoplasm of uncertain behavior of skin: Secondary | ICD-10-CM | POA: Diagnosis not present

## 2015-03-01 DIAGNOSIS — E230 Hypopituitarism: Secondary | ICD-10-CM | POA: Diagnosis not present

## 2015-03-01 DIAGNOSIS — D751 Secondary polycythemia: Secondary | ICD-10-CM | POA: Diagnosis not present

## 2015-03-01 DIAGNOSIS — E232 Diabetes insipidus: Secondary | ICD-10-CM | POA: Diagnosis not present

## 2015-05-02 DIAGNOSIS — H4011X3 Primary open-angle glaucoma, severe stage: Secondary | ICD-10-CM | POA: Diagnosis not present

## 2015-05-10 DIAGNOSIS — L565 Disseminated superficial actinic porokeratosis (DSAP): Secondary | ICD-10-CM | POA: Diagnosis not present

## 2015-05-11 DIAGNOSIS — Z1389 Encounter for screening for other disorder: Secondary | ICD-10-CM | POA: Diagnosis not present

## 2015-05-11 DIAGNOSIS — F439 Reaction to severe stress, unspecified: Secondary | ICD-10-CM | POA: Diagnosis not present

## 2015-05-11 DIAGNOSIS — E232 Diabetes insipidus: Secondary | ICD-10-CM | POA: Diagnosis not present

## 2015-05-11 DIAGNOSIS — E230 Hypopituitarism: Secondary | ICD-10-CM | POA: Diagnosis not present

## 2015-05-11 DIAGNOSIS — H9190 Unspecified hearing loss, unspecified ear: Secondary | ICD-10-CM | POA: Diagnosis not present

## 2015-05-11 DIAGNOSIS — F411 Generalized anxiety disorder: Secondary | ICD-10-CM | POA: Diagnosis not present

## 2015-05-11 DIAGNOSIS — Z0001 Encounter for general adult medical examination with abnormal findings: Secondary | ICD-10-CM | POA: Diagnosis not present

## 2015-05-11 DIAGNOSIS — E559 Vitamin D deficiency, unspecified: Secondary | ICD-10-CM | POA: Diagnosis not present

## 2015-05-11 DIAGNOSIS — S335XXA Sprain of ligaments of lumbar spine, initial encounter: Secondary | ICD-10-CM | POA: Diagnosis not present

## 2015-05-11 DIAGNOSIS — I739 Peripheral vascular disease, unspecified: Secondary | ICD-10-CM | POA: Diagnosis not present

## 2015-05-11 DIAGNOSIS — E780 Pure hypercholesterolemia: Secondary | ICD-10-CM | POA: Diagnosis not present

## 2015-08-29 DIAGNOSIS — H401133 Primary open-angle glaucoma, bilateral, severe stage: Secondary | ICD-10-CM | POA: Diagnosis not present

## 2015-08-29 DIAGNOSIS — H01002 Unspecified blepharitis right lower eyelid: Secondary | ICD-10-CM | POA: Diagnosis not present

## 2015-08-29 DIAGNOSIS — H01001 Unspecified blepharitis right upper eyelid: Secondary | ICD-10-CM | POA: Diagnosis not present

## 2015-09-06 DIAGNOSIS — D751 Secondary polycythemia: Secondary | ICD-10-CM | POA: Diagnosis not present

## 2015-09-06 DIAGNOSIS — Z23 Encounter for immunization: Secondary | ICD-10-CM | POA: Diagnosis not present

## 2015-09-06 DIAGNOSIS — E232 Diabetes insipidus: Secondary | ICD-10-CM | POA: Diagnosis not present

## 2015-09-06 DIAGNOSIS — E230 Hypopituitarism: Secondary | ICD-10-CM | POA: Diagnosis not present

## 2015-09-14 DIAGNOSIS — E232 Diabetes insipidus: Secondary | ICD-10-CM | POA: Diagnosis not present

## 2015-09-14 DIAGNOSIS — D751 Secondary polycythemia: Secondary | ICD-10-CM | POA: Diagnosis not present

## 2015-09-14 DIAGNOSIS — E230 Hypopituitarism: Secondary | ICD-10-CM | POA: Diagnosis not present

## 2015-10-09 DIAGNOSIS — B9789 Other viral agents as the cause of diseases classified elsewhere: Secondary | ICD-10-CM | POA: Diagnosis not present

## 2016-01-02 DIAGNOSIS — H401133 Primary open-angle glaucoma, bilateral, severe stage: Secondary | ICD-10-CM | POA: Diagnosis not present

## 2016-01-02 DIAGNOSIS — H01002 Unspecified blepharitis right lower eyelid: Secondary | ICD-10-CM | POA: Diagnosis not present

## 2016-01-02 DIAGNOSIS — H01001 Unspecified blepharitis right upper eyelid: Secondary | ICD-10-CM | POA: Diagnosis not present

## 2016-01-30 DIAGNOSIS — H01001 Unspecified blepharitis right upper eyelid: Secondary | ICD-10-CM | POA: Diagnosis not present

## 2016-01-30 DIAGNOSIS — H01002 Unspecified blepharitis right lower eyelid: Secondary | ICD-10-CM | POA: Diagnosis not present

## 2016-01-30 DIAGNOSIS — H401133 Primary open-angle glaucoma, bilateral, severe stage: Secondary | ICD-10-CM | POA: Diagnosis not present

## 2016-02-01 DIAGNOSIS — D485 Neoplasm of uncertain behavior of skin: Secondary | ICD-10-CM | POA: Diagnosis not present

## 2016-02-01 DIAGNOSIS — L565 Disseminated superficial actinic porokeratosis (DSAP): Secondary | ICD-10-CM | POA: Diagnosis not present

## 2016-02-01 DIAGNOSIS — L821 Other seborrheic keratosis: Secondary | ICD-10-CM | POA: Diagnosis not present

## 2016-02-01 DIAGNOSIS — C44619 Basal cell carcinoma of skin of left upper limb, including shoulder: Secondary | ICD-10-CM | POA: Diagnosis not present

## 2016-02-01 DIAGNOSIS — L570 Actinic keratosis: Secondary | ICD-10-CM | POA: Diagnosis not present

## 2016-02-01 DIAGNOSIS — D1801 Hemangioma of skin and subcutaneous tissue: Secondary | ICD-10-CM | POA: Diagnosis not present

## 2016-03-12 DIAGNOSIS — E232 Diabetes insipidus: Secondary | ICD-10-CM | POA: Diagnosis not present

## 2016-03-12 DIAGNOSIS — Z125 Encounter for screening for malignant neoplasm of prostate: Secondary | ICD-10-CM | POA: Diagnosis not present

## 2016-03-12 DIAGNOSIS — Z8546 Personal history of malignant neoplasm of prostate: Secondary | ICD-10-CM | POA: Diagnosis not present

## 2016-03-12 DIAGNOSIS — E230 Hypopituitarism: Secondary | ICD-10-CM | POA: Diagnosis not present

## 2016-03-12 DIAGNOSIS — D751 Secondary polycythemia: Secondary | ICD-10-CM | POA: Diagnosis not present

## 2016-03-12 DIAGNOSIS — D497 Neoplasm of unspecified behavior of endocrine glands and other parts of nervous system: Secondary | ICD-10-CM | POA: Diagnosis not present

## 2016-03-12 DIAGNOSIS — E785 Hyperlipidemia, unspecified: Secondary | ICD-10-CM | POA: Diagnosis not present

## 2016-03-16 ENCOUNTER — Other Ambulatory Visit: Payer: Self-pay | Admitting: Internal Medicine

## 2016-03-16 DIAGNOSIS — D497 Neoplasm of unspecified behavior of endocrine glands and other parts of nervous system: Secondary | ICD-10-CM

## 2016-04-09 DIAGNOSIS — J019 Acute sinusitis, unspecified: Secondary | ICD-10-CM | POA: Diagnosis not present

## 2016-04-10 ENCOUNTER — Other Ambulatory Visit: Payer: Medicare Other

## 2016-04-17 ENCOUNTER — Ambulatory Visit
Admission: RE | Admit: 2016-04-17 | Discharge: 2016-04-17 | Disposition: A | Discharge status: Home / Self Care | Payer: Medicare Other | Source: Ambulatory Visit | Attending: Internal Medicine | Admitting: Internal Medicine

## 2016-04-17 DIAGNOSIS — C751 Malignant neoplasm of pituitary gland: Secondary | ICD-10-CM | POA: Diagnosis not present

## 2016-04-17 DIAGNOSIS — D497 Neoplasm of unspecified behavior of endocrine glands and other parts of nervous system: Secondary | ICD-10-CM

## 2016-04-17 MED ORDER — GADOBENATE DIMEGLUMINE 529 MG/ML IV SOLN
9.0000 mL | Freq: Once | INTRAVENOUS | Status: AC | PRN
  Administered 2016-04-17: 9 mL via INTRAVENOUS

## 2016-04-18 ENCOUNTER — Other Ambulatory Visit: Payer: Medicare Other

## 2016-04-25 DIAGNOSIS — E230 Hypopituitarism: Secondary | ICD-10-CM | POA: Diagnosis not present

## 2016-04-25 DIAGNOSIS — D751 Secondary polycythemia: Secondary | ICD-10-CM | POA: Diagnosis not present

## 2016-05-09 DIAGNOSIS — D751 Secondary polycythemia: Secondary | ICD-10-CM | POA: Diagnosis not present

## 2016-05-09 DIAGNOSIS — E230 Hypopituitarism: Secondary | ICD-10-CM | POA: Diagnosis not present

## 2016-05-14 DIAGNOSIS — Z23 Encounter for immunization: Secondary | ICD-10-CM | POA: Diagnosis not present

## 2016-05-14 DIAGNOSIS — Z1389 Encounter for screening for other disorder: Secondary | ICD-10-CM | POA: Diagnosis not present

## 2016-05-14 DIAGNOSIS — Z0000 Encounter for general adult medical examination without abnormal findings: Secondary | ICD-10-CM | POA: Diagnosis not present

## 2016-05-14 DIAGNOSIS — H9190 Unspecified hearing loss, unspecified ear: Secondary | ICD-10-CM | POA: Diagnosis not present

## 2016-05-14 DIAGNOSIS — E230 Hypopituitarism: Secondary | ICD-10-CM | POA: Diagnosis not present

## 2016-05-14 DIAGNOSIS — Z6828 Body mass index (BMI) 28.0-28.9, adult: Secondary | ICD-10-CM | POA: Diagnosis not present

## 2016-05-14 DIAGNOSIS — Z79899 Other long term (current) drug therapy: Secondary | ICD-10-CM | POA: Diagnosis not present

## 2016-05-14 DIAGNOSIS — D81818 Other biotin-dependent carboxylase deficiency: Secondary | ICD-10-CM | POA: Diagnosis not present

## 2016-05-14 DIAGNOSIS — E232 Diabetes insipidus: Secondary | ICD-10-CM | POA: Diagnosis not present

## 2016-05-14 DIAGNOSIS — E559 Vitamin D deficiency, unspecified: Secondary | ICD-10-CM | POA: Diagnosis not present

## 2016-05-14 DIAGNOSIS — E785 Hyperlipidemia, unspecified: Secondary | ICD-10-CM | POA: Diagnosis not present

## 2016-05-14 DIAGNOSIS — I739 Peripheral vascular disease, unspecified: Secondary | ICD-10-CM | POA: Diagnosis not present

## 2016-05-14 DIAGNOSIS — Z125 Encounter for screening for malignant neoplasm of prostate: Secondary | ICD-10-CM | POA: Diagnosis not present

## 2016-05-14 DIAGNOSIS — D751 Secondary polycythemia: Secondary | ICD-10-CM | POA: Diagnosis not present

## 2016-05-14 DIAGNOSIS — S335XXA Sprain of ligaments of lumbar spine, initial encounter: Secondary | ICD-10-CM | POA: Diagnosis not present

## 2016-05-14 DIAGNOSIS — F411 Generalized anxiety disorder: Secondary | ICD-10-CM | POA: Diagnosis not present

## 2016-05-15 DIAGNOSIS — D352 Benign neoplasm of pituitary gland: Secondary | ICD-10-CM | POA: Diagnosis not present

## 2016-05-29 ENCOUNTER — Other Ambulatory Visit: Payer: Self-pay | Admitting: Nurse Practitioner

## 2016-05-29 ENCOUNTER — Ambulatory Visit
Admission: RE | Admit: 2016-05-29 | Discharge: 2016-05-29 | Disposition: A | Discharge status: Home / Self Care | Payer: Medicare Other | Source: Ambulatory Visit | Attending: Nurse Practitioner | Admitting: Nurse Practitioner

## 2016-05-29 DIAGNOSIS — M545 Low back pain: Secondary | ICD-10-CM | POA: Diagnosis not present

## 2016-05-29 DIAGNOSIS — M79605 Pain in left leg: Secondary | ICD-10-CM

## 2016-05-29 DIAGNOSIS — M79604 Pain in right leg: Secondary | ICD-10-CM

## 2016-06-11 DIAGNOSIS — D751 Secondary polycythemia: Secondary | ICD-10-CM | POA: Diagnosis not present

## 2016-06-11 DIAGNOSIS — Z79899 Other long term (current) drug therapy: Secondary | ICD-10-CM | POA: Diagnosis not present

## 2016-06-11 DIAGNOSIS — E230 Hypopituitarism: Secondary | ICD-10-CM | POA: Diagnosis not present

## 2016-06-14 DIAGNOSIS — H01002 Unspecified blepharitis right lower eyelid: Secondary | ICD-10-CM | POA: Diagnosis not present

## 2016-06-14 DIAGNOSIS — H401133 Primary open-angle glaucoma, bilateral, severe stage: Secondary | ICD-10-CM | POA: Diagnosis not present

## 2016-06-14 DIAGNOSIS — H01001 Unspecified blepharitis right upper eyelid: Secondary | ICD-10-CM | POA: Diagnosis not present

## 2016-06-14 DIAGNOSIS — H524 Presbyopia: Secondary | ICD-10-CM | POA: Diagnosis not present

## 2016-07-18 DIAGNOSIS — D751 Secondary polycythemia: Secondary | ICD-10-CM | POA: Diagnosis not present

## 2016-07-18 DIAGNOSIS — E230 Hypopituitarism: Secondary | ICD-10-CM | POA: Diagnosis not present

## 2016-08-13 DIAGNOSIS — S39012D Strain of muscle, fascia and tendon of lower back, subsequent encounter: Secondary | ICD-10-CM | POA: Diagnosis not present

## 2016-08-13 DIAGNOSIS — Z23 Encounter for immunization: Secondary | ICD-10-CM | POA: Diagnosis not present

## 2016-09-18 DIAGNOSIS — D751 Secondary polycythemia: Secondary | ICD-10-CM | POA: Diagnosis not present

## 2016-09-18 DIAGNOSIS — E785 Hyperlipidemia, unspecified: Secondary | ICD-10-CM | POA: Diagnosis not present

## 2016-09-18 DIAGNOSIS — D497 Neoplasm of unspecified behavior of endocrine glands and other parts of nervous system: Secondary | ICD-10-CM | POA: Diagnosis not present

## 2016-09-18 DIAGNOSIS — E232 Diabetes insipidus: Secondary | ICD-10-CM | POA: Diagnosis not present

## 2016-09-18 DIAGNOSIS — E230 Hypopituitarism: Secondary | ICD-10-CM | POA: Diagnosis not present

## 2016-09-18 DIAGNOSIS — Z8546 Personal history of malignant neoplasm of prostate: Secondary | ICD-10-CM | POA: Diagnosis not present

## 2017-01-10 DIAGNOSIS — J029 Acute pharyngitis, unspecified: Secondary | ICD-10-CM | POA: Diagnosis not present

## 2017-01-16 DIAGNOSIS — H01001 Unspecified blepharitis right upper eyelid: Secondary | ICD-10-CM | POA: Diagnosis not present

## 2017-01-16 DIAGNOSIS — H524 Presbyopia: Secondary | ICD-10-CM | POA: Diagnosis not present

## 2017-01-16 DIAGNOSIS — H01002 Unspecified blepharitis right lower eyelid: Secondary | ICD-10-CM | POA: Diagnosis not present

## 2017-01-16 DIAGNOSIS — H401133 Primary open-angle glaucoma, bilateral, severe stage: Secondary | ICD-10-CM | POA: Diagnosis not present

## 2017-01-30 DIAGNOSIS — L570 Actinic keratosis: Secondary | ICD-10-CM | POA: Diagnosis not present

## 2017-01-30 DIAGNOSIS — D1801 Hemangioma of skin and subcutaneous tissue: Secondary | ICD-10-CM | POA: Diagnosis not present

## 2017-01-30 DIAGNOSIS — L821 Other seborrheic keratosis: Secondary | ICD-10-CM | POA: Diagnosis not present

## 2017-01-30 DIAGNOSIS — L565 Disseminated superficial actinic porokeratosis (DSAP): Secondary | ICD-10-CM | POA: Diagnosis not present

## 2017-02-25 DIAGNOSIS — Z87891 Personal history of nicotine dependence: Secondary | ICD-10-CM | POA: Diagnosis not present

## 2017-02-25 DIAGNOSIS — J209 Acute bronchitis, unspecified: Secondary | ICD-10-CM | POA: Diagnosis not present

## 2017-02-25 DIAGNOSIS — J0100 Acute maxillary sinusitis, unspecified: Secondary | ICD-10-CM | POA: Diagnosis not present

## 2017-03-25 DIAGNOSIS — E230 Hypopituitarism: Secondary | ICD-10-CM | POA: Diagnosis not present

## 2017-03-25 DIAGNOSIS — D497 Neoplasm of unspecified behavior of endocrine glands and other parts of nervous system: Secondary | ICD-10-CM | POA: Diagnosis not present

## 2017-03-25 DIAGNOSIS — Z5181 Encounter for therapeutic drug level monitoring: Secondary | ICD-10-CM | POA: Diagnosis not present

## 2017-03-25 DIAGNOSIS — Z8546 Personal history of malignant neoplasm of prostate: Secondary | ICD-10-CM | POA: Diagnosis not present

## 2017-03-25 DIAGNOSIS — E232 Diabetes insipidus: Secondary | ICD-10-CM | POA: Diagnosis not present

## 2017-03-25 DIAGNOSIS — D751 Secondary polycythemia: Secondary | ICD-10-CM | POA: Diagnosis not present

## 2017-03-25 DIAGNOSIS — E785 Hyperlipidemia, unspecified: Secondary | ICD-10-CM | POA: Diagnosis not present

## 2017-08-08 DIAGNOSIS — H01001 Unspecified blepharitis right upper eyelid: Secondary | ICD-10-CM | POA: Diagnosis not present

## 2017-08-08 DIAGNOSIS — H01002 Unspecified blepharitis right lower eyelid: Secondary | ICD-10-CM | POA: Diagnosis not present

## 2017-08-08 DIAGNOSIS — H401133 Primary open-angle glaucoma, bilateral, severe stage: Secondary | ICD-10-CM | POA: Diagnosis not present

## 2017-08-08 DIAGNOSIS — H524 Presbyopia: Secondary | ICD-10-CM | POA: Diagnosis not present

## 2017-09-04 ENCOUNTER — Other Ambulatory Visit: Payer: Self-pay | Admitting: Internal Medicine

## 2017-09-04 ENCOUNTER — Other Ambulatory Visit (HOSPITAL_COMMUNITY): Payer: Self-pay | Admitting: Internal Medicine

## 2017-09-04 DIAGNOSIS — G459 Transient cerebral ischemic attack, unspecified: Secondary | ICD-10-CM | POA: Diagnosis not present

## 2017-09-04 DIAGNOSIS — E785 Hyperlipidemia, unspecified: Secondary | ICD-10-CM

## 2017-09-04 DIAGNOSIS — Z23 Encounter for immunization: Secondary | ICD-10-CM | POA: Diagnosis not present

## 2017-09-04 DIAGNOSIS — H539 Unspecified visual disturbance: Secondary | ICD-10-CM | POA: Diagnosis not present

## 2017-09-04 DIAGNOSIS — R471 Dysarthria and anarthria: Secondary | ICD-10-CM | POA: Diagnosis not present

## 2017-09-05 ENCOUNTER — Ambulatory Visit
Admission: RE | Admit: 2017-09-05 | Discharge: 2017-09-05 | Disposition: A | Discharge status: Home / Self Care | Payer: Medicare Other | Source: Ambulatory Visit | Attending: Internal Medicine | Admitting: Internal Medicine

## 2017-09-05 DIAGNOSIS — G459 Transient cerebral ischemic attack, unspecified: Secondary | ICD-10-CM

## 2017-09-05 DIAGNOSIS — I639 Cerebral infarction, unspecified: Secondary | ICD-10-CM | POA: Diagnosis not present

## 2017-09-09 ENCOUNTER — Ambulatory Visit
Admission: RE | Admit: 2017-09-09 | Discharge: 2017-09-09 | Disposition: A | Discharge status: Home / Self Care | Payer: Medicare Other | Source: Ambulatory Visit | Attending: Internal Medicine | Admitting: Internal Medicine

## 2017-09-09 DIAGNOSIS — I6523 Occlusion and stenosis of bilateral carotid arteries: Secondary | ICD-10-CM | POA: Diagnosis not present

## 2017-09-09 DIAGNOSIS — E785 Hyperlipidemia, unspecified: Secondary | ICD-10-CM

## 2017-09-10 ENCOUNTER — Ambulatory Visit (HOSPITAL_COMMUNITY)
Admission: RE | Admit: 2017-09-10 | Discharge: 2017-09-10 | Disposition: A | Discharge status: Home / Self Care | Payer: Medicare Other | Source: Ambulatory Visit | Attending: Internal Medicine | Admitting: Internal Medicine

## 2017-09-10 DIAGNOSIS — E785 Hyperlipidemia, unspecified: Secondary | ICD-10-CM | POA: Insufficient documentation

## 2017-09-10 DIAGNOSIS — G459 Transient cerebral ischemic attack, unspecified: Secondary | ICD-10-CM | POA: Diagnosis not present

## 2017-09-10 DIAGNOSIS — I081 Rheumatic disorders of both mitral and tricuspid valves: Secondary | ICD-10-CM | POA: Diagnosis not present

## 2017-09-10 NOTE — Progress Notes (Signed)
  Echocardiogram 2D Echocardiogram has been performed.  Emillee Talsma L Androw 09/10/2017, 1:43 PM

## 2017-09-18 DIAGNOSIS — E785 Hyperlipidemia, unspecified: Secondary | ICD-10-CM | POA: Diagnosis not present

## 2017-09-18 DIAGNOSIS — G459 Transient cerebral ischemic attack, unspecified: Secondary | ICD-10-CM | POA: Diagnosis not present

## 2017-09-18 DIAGNOSIS — Z23 Encounter for immunization: Secondary | ICD-10-CM | POA: Diagnosis not present

## 2017-10-01 DIAGNOSIS — Z5181 Encounter for therapeutic drug level monitoring: Secondary | ICD-10-CM | POA: Diagnosis not present

## 2017-10-01 DIAGNOSIS — Z8546 Personal history of malignant neoplasm of prostate: Secondary | ICD-10-CM | POA: Diagnosis not present

## 2017-10-01 DIAGNOSIS — E232 Diabetes insipidus: Secondary | ICD-10-CM | POA: Diagnosis not present

## 2017-10-01 DIAGNOSIS — D497 Neoplasm of unspecified behavior of endocrine glands and other parts of nervous system: Secondary | ICD-10-CM | POA: Diagnosis not present

## 2017-10-01 DIAGNOSIS — E785 Hyperlipidemia, unspecified: Secondary | ICD-10-CM | POA: Diagnosis not present

## 2017-10-01 DIAGNOSIS — D751 Secondary polycythemia: Secondary | ICD-10-CM | POA: Diagnosis not present

## 2017-10-01 DIAGNOSIS — E230 Hypopituitarism: Secondary | ICD-10-CM | POA: Diagnosis not present

## 2017-11-03 ENCOUNTER — Other Ambulatory Visit: Payer: Self-pay

## 2017-11-03 ENCOUNTER — Encounter (HOSPITAL_COMMUNITY): Payer: Self-pay | Admitting: *Deleted

## 2017-11-03 ENCOUNTER — Ambulatory Visit (HOSPITAL_COMMUNITY)
Admission: EM | Admit: 2017-11-03 | Discharge: 2017-11-03 | Disposition: A | Discharge status: Home / Self Care | Payer: Medicare Other | Attending: Internal Medicine | Admitting: Internal Medicine

## 2017-11-03 DIAGNOSIS — L03317 Cellulitis of buttock: Secondary | ICD-10-CM | POA: Diagnosis not present

## 2017-11-03 HISTORY — DX: Malignant (primary) neoplasm, unspecified: C80.1

## 2017-11-03 MED ORDER — ONDANSETRON 4 MG PO TBDP
ORAL_TABLET | ORAL | Status: AC
  Filled 2017-11-03: qty 1

## 2017-11-03 MED ORDER — HYDROCODONE-ACETAMINOPHEN 5-325 MG PO TABS: 0.5000 | ORAL_TABLET | ORAL | 0 refills | Status: DC | PRN

## 2017-11-03 MED ORDER — ONDANSETRON 4 MG PO TBDP
4.0000 mg | ORAL_TABLET | Freq: Once | ORAL | Status: AC
  Administered 2017-11-03: 4 mg via ORAL

## 2017-11-03 MED ORDER — CLINDAMYCIN HCL 150 MG PO CAPS: 300.0000 mg | ORAL_CAPSULE | Freq: Three times a day (TID) | ORAL | 0 refills | Status: AC

## 2017-11-03 NOTE — ED Triage Notes (Signed)
Pt states has had a "?keloid" to upper buttock x 2-3 years that had started as a "pimple" that dermatologist treated.  Started 1-2 days ago with warmth, significant pain to area.  Unsure if fevers.

## 2017-11-03 NOTE — ED Provider Notes (Signed)
[facility]    CSN: [id number] Arrival date & time: 11/03/17  1842     History   Chief Complaint Chief Complaint  Patient presents with  . Wound Check    HPI Alexander Yoder is a 83 y.o. male.   83 year old male, presenting today for wound check.  Patient states that he has had pain and redness to the right side of his gluteal cleft for the past 2-3 days.  Patient states that he has a keloid scar to the affected area.  Believes that he may have scratched it in the shower a few days ago.  Since that time, he has had pain and redness to the affected area.  He denies any fever or chills.  Denies any drainage from the area.   The history is provided by the patient.  Wound Check  This is a new problem. The current episode started 2 days ago. The problem occurs constantly. The problem has not changed since onset.Pertinent negatives include no chest pain, no abdominal pain, no headaches and no shortness of breath. Nothing aggravates the symptoms. Nothing relieves the symptoms. He has tried nothing for the symptoms. The treatment provided no relief.    Past Medical History:  Diagnosis Date  . Anxiety attack   . Cancer [facility])    prostate  . Glaucoma   . Pituitary tumor     There are no active problems to display for this patient.   Past Surgical History:  Procedure Laterality Date  . PROSTATECTOMY    . TRANSPHENOIDAL / TRANSNASAL HYPOPHYSECTOMY / RESECTION PITUITARY TUMOR     x2       Home Medications    Prior to Admission medications   Medication Sig Start Date End Date Taking? Authorizing Provider  bimatoprost (LUMIGAN) 0.01 % SOLN 1 drop at bedtime.   Yes [provider]  brimonidine (ALPHAGAN) 0.15 % ophthalmic solution 1 drop 3 (three) times daily.   Yes [provider]  clonazePAM (KLONOPIN) 1 MG tablet Take 1 mg by mouth at bedtime as needed. Anxiety   Yes [provider]  desmopressin (DDAVP) 0.2 MG tablet Take 0.2 mg by  mouth 2 (two) times daily.   Yes [provider]  hydrocortisone (CORTEF) 10 MG tablet Take 10 mg by mouth 2 (two) times daily.   Yes [provider]  rosuvastatin (CRESTOR) 5 MG tablet Take 5 mg by mouth daily.   Yes [provider]  acetaminophen (TYLENOL) 500 MG tablet Take 500 mg by mouth every 6 (six) hours as needed.    [provider]  cetirizine (ZYRTEC) 10 MG tablet Take 10 mg by mouth daily.    [provider]  clindamycin (CLEOCIN) 150 MG capsule Take 2 capsules (300 mg total) by mouth 3 (three) times daily for 10 days. 11/03/17 11/13/17  Blue, Olivia C, PA-C  fluticasone (FLONASE) 50 MCG/ACT nasal spray Place 2 sprays into the nose daily.    [provider]  HYDROcodone-acetaminophen (NORCO/VICODIN) 5-325 MG tablet Take 0.5-1 tablets by mouth every 4 (four) hours as needed. 11/03/17   Blue, Belenda Cruise, PA-C    Family History No family history on file.  Social History Social History   Tobacco Use  . Smoking status: Never Smoker  Substance Use Topics  . Alcohol use: No  . Drug use: Not on file     Allergies   Peanut-containing drug products and Penicillins   Review of Systems Review of Systems  Constitutional: Negative for  chills and fever.  HENT: Negative for ear pain and sore throat.   Eyes: Negative for pain and visual disturbance.  Respiratory: Negative for cough and shortness of breath.   Cardiovascular: Negative for chest pain and palpitations.  Gastrointestinal: Negative for abdominal pain and vomiting.  Genitourinary: Negative for dysuria and hematuria.  Musculoskeletal: Negative for arthralgias and back pain.  Skin: Positive for wound (right sided gluteal cleft). Negative for color change and rash.  Neurological: Negative for seizures, syncope and headaches.  All other systems reviewed and are negative.    Physical Exam Triage Vital Signs ED Triage Vitals  Enc Vitals Group     BP 11/03/17 1852  136/81     Pulse Rate 11/03/17 1852 89     Resp 11/03/17 1852 18     Temp 11/03/17 1852 98 F (36.7 C)     Temp src --      SpO2 11/03/17 1852 99 %     Weight --      Height --      Head Circumference --      Peak Flow --      Pain Score 11/03/17 1853 9     Pain Loc --      Pain Edu? --      Excl. in [place]? --    No data found.  Updated Vital Signs BP 136/81   Pulse 89   Temp 98 F (36.7 C)   Resp 18   SpO2 99%   Visual Acuity Right Eye Distance:   Left Eye Distance:   Bilateral Distance:    Right Eye Near:   Left Eye Near:    Bilateral Near:     Physical Exam  Constitutional: He appears well-developed and well-nourished.  HENT:  Head: Normocephalic and atraumatic.  Eyes: Conjunctivae are normal.  Neck: Neck supple.  Cardiovascular: Normal rate and regular rhythm.  No murmur heard. Pulmonary/Chest: Effort normal and breath sounds normal. No respiratory distress.  Abdominal: Soft. There is no tenderness.  Musculoskeletal: He exhibits no edema.  Neurological: He is alert.  Skin: Skin is warm and dry.     Erythema noted to the right side of the gluteal cleft with surrounding induration.  No area of central fluctuance.  No drainage or red streaking noted.  Psychiatric: He has a normal mood and affect.  Nursing note and vitals reviewed.    UC Treatments / Results  Labs (all labs ordered are listed, but only abnormal results are displayed) Labs Reviewed - No data to display  EKG  EKG Interpretation None       Radiology No results found.  Procedures Procedures (including critical care time)  Medications Ordered in UC Medications  ondansetron (ZOFRAN-ODT) disintegrating tablet 4 mg (not administered)     Initial Impression / Assessment and Plan / UC Course  I have reviewed the triage vital signs and the nursing notes.  Pertinent labs & imaging results that were available during my care of the patient were reviewed by me and considered in my  medical decision making (see chart for details).     Cellulitis without evidence of abscess - will treat with clinda and warm compresses recommended follow-up in 2-3 days for recheck  Final Clinical Impressions(s) / UC Diagnoses   Final diagnoses:  Cellulitis of right buttock    ED Discharge Orders        Ordered    clindamycin (CLEOCIN) 150 MG capsule  3 times daily     11/03/17  1907    HYDROcodone-acetaminophen (NORCO/VICODIN) 5-325 MG tablet  Every 4 hours PRN     11/03/17 1907       Controlled Substance Prescriptions [place] Controlled Substance Registry consulted? Yes, I have consulted the  Controlled Substances Registry for this patient, and feel the risk/benefit ratio today is favorable for proceeding with this prescription for a controlled substance.   Phebe Colla, [place] 11/03/17 1911

## 2017-11-05 DIAGNOSIS — L0232 Furuncle of buttock: Secondary | ICD-10-CM | POA: Diagnosis not present

## 2017-11-06 DIAGNOSIS — Z79899 Other long term (current) drug therapy: Secondary | ICD-10-CM | POA: Diagnosis not present

## 2017-11-06 DIAGNOSIS — D751 Secondary polycythemia: Secondary | ICD-10-CM | POA: Diagnosis not present

## 2017-11-06 DIAGNOSIS — E230 Hypopituitarism: Secondary | ICD-10-CM | POA: Diagnosis not present

## 2017-12-24 DIAGNOSIS — Z8546 Personal history of malignant neoplasm of prostate: Secondary | ICD-10-CM | POA: Diagnosis not present

## 2017-12-24 DIAGNOSIS — E230 Hypopituitarism: Secondary | ICD-10-CM | POA: Diagnosis not present

## 2017-12-24 DIAGNOSIS — R471 Dysarthria and anarthria: Secondary | ICD-10-CM | POA: Diagnosis not present

## 2017-12-24 DIAGNOSIS — I10 Essential (primary) hypertension: Secondary | ICD-10-CM | POA: Diagnosis not present

## 2017-12-24 DIAGNOSIS — R32 Unspecified urinary incontinence: Secondary | ICD-10-CM | POA: Diagnosis not present

## 2017-12-24 DIAGNOSIS — D751 Secondary polycythemia: Secondary | ICD-10-CM | POA: Diagnosis not present

## 2017-12-24 DIAGNOSIS — E7800 Pure hypercholesterolemia, unspecified: Secondary | ICD-10-CM | POA: Diagnosis not present

## 2017-12-24 DIAGNOSIS — G459 Transient cerebral ischemic attack, unspecified: Secondary | ICD-10-CM | POA: Diagnosis not present

## 2017-12-24 DIAGNOSIS — E559 Vitamin D deficiency, unspecified: Secondary | ICD-10-CM | POA: Diagnosis not present

## 2017-12-24 DIAGNOSIS — Z79899 Other long term (current) drug therapy: Secondary | ICD-10-CM | POA: Diagnosis not present

## 2017-12-24 DIAGNOSIS — Z1389 Encounter for screening for other disorder: Secondary | ICD-10-CM | POA: Diagnosis not present

## 2017-12-24 DIAGNOSIS — H9193 Unspecified hearing loss, bilateral: Secondary | ICD-10-CM | POA: Diagnosis not present

## 2018-02-03 DIAGNOSIS — D1801 Hemangioma of skin and subcutaneous tissue: Secondary | ICD-10-CM | POA: Diagnosis not present

## 2018-02-03 DIAGNOSIS — L565 Disseminated superficial actinic porokeratosis (DSAP): Secondary | ICD-10-CM | POA: Diagnosis not present

## 2018-02-03 DIAGNOSIS — L400 Psoriasis vulgaris: Secondary | ICD-10-CM | POA: Diagnosis not present

## 2018-02-03 DIAGNOSIS — D225 Melanocytic nevi of trunk: Secondary | ICD-10-CM | POA: Diagnosis not present

## 2018-02-03 DIAGNOSIS — D692 Other nonthrombocytopenic purpura: Secondary | ICD-10-CM | POA: Diagnosis not present

## 2018-02-03 DIAGNOSIS — L570 Actinic keratosis: Secondary | ICD-10-CM | POA: Diagnosis not present

## 2018-02-03 DIAGNOSIS — L821 Other seborrheic keratosis: Secondary | ICD-10-CM | POA: Diagnosis not present

## 2018-02-13 DIAGNOSIS — H401133 Primary open-angle glaucoma, bilateral, severe stage: Secondary | ICD-10-CM | POA: Diagnosis not present

## 2018-02-13 DIAGNOSIS — H01002 Unspecified blepharitis right lower eyelid: Secondary | ICD-10-CM | POA: Diagnosis not present

## 2018-02-13 DIAGNOSIS — H524 Presbyopia: Secondary | ICD-10-CM | POA: Diagnosis not present

## 2018-02-13 DIAGNOSIS — H10413 Chronic giant papillary conjunctivitis, bilateral: Secondary | ICD-10-CM | POA: Diagnosis not present

## 2018-02-13 DIAGNOSIS — H01001 Unspecified blepharitis right upper eyelid: Secondary | ICD-10-CM | POA: Diagnosis not present

## 2018-04-08 DIAGNOSIS — E232 Diabetes insipidus: Secondary | ICD-10-CM | POA: Diagnosis not present

## 2018-04-08 DIAGNOSIS — E785 Hyperlipidemia, unspecified: Secondary | ICD-10-CM | POA: Diagnosis not present

## 2018-04-08 DIAGNOSIS — D497 Neoplasm of unspecified behavior of endocrine glands and other parts of nervous system: Secondary | ICD-10-CM | POA: Diagnosis not present

## 2018-04-08 DIAGNOSIS — Z8546 Personal history of malignant neoplasm of prostate: Secondary | ICD-10-CM | POA: Diagnosis not present

## 2018-04-08 DIAGNOSIS — D751 Secondary polycythemia: Secondary | ICD-10-CM | POA: Diagnosis not present

## 2018-04-08 DIAGNOSIS — E230 Hypopituitarism: Secondary | ICD-10-CM | POA: Diagnosis not present

## 2018-04-08 DIAGNOSIS — Z5181 Encounter for therapeutic drug level monitoring: Secondary | ICD-10-CM | POA: Diagnosis not present

## 2018-04-10 ENCOUNTER — Other Ambulatory Visit: Payer: Self-pay | Admitting: Internal Medicine

## 2018-04-10 DIAGNOSIS — D497 Neoplasm of unspecified behavior of endocrine glands and other parts of nervous system: Secondary | ICD-10-CM

## 2018-04-21 ENCOUNTER — Other Ambulatory Visit: Payer: Medicare Other

## 2018-04-22 ENCOUNTER — Ambulatory Visit
Admission: RE | Admit: 2018-04-22 | Discharge: 2018-04-22 | Disposition: A | Discharge status: Home / Self Care | Payer: Medicare Other | Source: Ambulatory Visit | Attending: Internal Medicine | Admitting: Internal Medicine

## 2018-04-22 DIAGNOSIS — E893 Postprocedural hypopituitarism: Secondary | ICD-10-CM | POA: Diagnosis not present

## 2018-04-22 DIAGNOSIS — D497 Neoplasm of unspecified behavior of endocrine glands and other parts of nervous system: Secondary | ICD-10-CM

## 2018-04-22 MED ORDER — GADOBENATE DIMEGLUMINE 529 MG/ML IV SOLN
9.0000 mL | Freq: Once | INTRAVENOUS | Status: AC | PRN
  Administered 2018-04-22: 9 mL via INTRAVENOUS

## 2018-08-12 DIAGNOSIS — E785 Hyperlipidemia, unspecified: Secondary | ICD-10-CM | POA: Diagnosis not present

## 2018-08-12 DIAGNOSIS — Z79899 Other long term (current) drug therapy: Secondary | ICD-10-CM | POA: Diagnosis not present

## 2018-08-12 DIAGNOSIS — G459 Transient cerebral ischemic attack, unspecified: Secondary | ICD-10-CM | POA: Diagnosis not present

## 2018-08-12 DIAGNOSIS — Z23 Encounter for immunization: Secondary | ICD-10-CM | POA: Diagnosis not present

## 2018-08-12 DIAGNOSIS — D751 Secondary polycythemia: Secondary | ICD-10-CM | POA: Diagnosis not present

## 2018-08-12 DIAGNOSIS — R32 Unspecified urinary incontinence: Secondary | ICD-10-CM | POA: Diagnosis not present

## 2018-08-12 DIAGNOSIS — Z0000 Encounter for general adult medical examination without abnormal findings: Secondary | ICD-10-CM | POA: Diagnosis not present

## 2018-08-12 DIAGNOSIS — E559 Vitamin D deficiency, unspecified: Secondary | ICD-10-CM | POA: Diagnosis not present

## 2018-08-12 DIAGNOSIS — D497 Neoplasm of unspecified behavior of endocrine glands and other parts of nervous system: Secondary | ICD-10-CM | POA: Diagnosis not present

## 2018-08-12 DIAGNOSIS — Z8546 Personal history of malignant neoplasm of prostate: Secondary | ICD-10-CM | POA: Diagnosis not present

## 2018-08-12 DIAGNOSIS — Z1389 Encounter for screening for other disorder: Secondary | ICD-10-CM | POA: Diagnosis not present

## 2018-08-12 DIAGNOSIS — H9193 Unspecified hearing loss, bilateral: Secondary | ICD-10-CM | POA: Diagnosis not present

## 2018-08-12 DIAGNOSIS — I10 Essential (primary) hypertension: Secondary | ICD-10-CM | POA: Diagnosis not present

## 2018-08-12 DIAGNOSIS — E232 Diabetes insipidus: Secondary | ICD-10-CM | POA: Diagnosis not present

## 2018-08-12 DIAGNOSIS — E230 Hypopituitarism: Secondary | ICD-10-CM | POA: Diagnosis not present

## 2018-08-13 DIAGNOSIS — Z23 Encounter for immunization: Secondary | ICD-10-CM | POA: Diagnosis not present

## 2018-08-20 DIAGNOSIS — Z8546 Personal history of malignant neoplasm of prostate: Secondary | ICD-10-CM | POA: Diagnosis not present

## 2018-08-20 DIAGNOSIS — E230 Hypopituitarism: Secondary | ICD-10-CM | POA: Diagnosis not present

## 2018-08-20 DIAGNOSIS — E559 Vitamin D deficiency, unspecified: Secondary | ICD-10-CM | POA: Diagnosis not present

## 2018-08-20 DIAGNOSIS — E7800 Pure hypercholesterolemia, unspecified: Secondary | ICD-10-CM | POA: Diagnosis not present

## 2018-09-04 DIAGNOSIS — H10413 Chronic giant papillary conjunctivitis, bilateral: Secondary | ICD-10-CM | POA: Diagnosis not present

## 2018-09-04 DIAGNOSIS — H01001 Unspecified blepharitis right upper eyelid: Secondary | ICD-10-CM | POA: Diagnosis not present

## 2018-09-04 DIAGNOSIS — H01002 Unspecified blepharitis right lower eyelid: Secondary | ICD-10-CM | POA: Diagnosis not present

## 2018-09-04 DIAGNOSIS — H401133 Primary open-angle glaucoma, bilateral, severe stage: Secondary | ICD-10-CM | POA: Diagnosis not present

## 2018-09-24 DIAGNOSIS — J0100 Acute maxillary sinusitis, unspecified: Secondary | ICD-10-CM | POA: Diagnosis not present

## 2018-09-24 DIAGNOSIS — J029 Acute pharyngitis, unspecified: Secondary | ICD-10-CM | POA: Diagnosis not present

## 2018-10-14 DIAGNOSIS — Z8546 Personal history of malignant neoplasm of prostate: Secondary | ICD-10-CM | POA: Diagnosis not present

## 2018-10-14 DIAGNOSIS — E230 Hypopituitarism: Secondary | ICD-10-CM | POA: Diagnosis not present

## 2018-10-14 DIAGNOSIS — Z5181 Encounter for therapeutic drug level monitoring: Secondary | ICD-10-CM | POA: Diagnosis not present

## 2018-10-14 DIAGNOSIS — D497 Neoplasm of unspecified behavior of endocrine glands and other parts of nervous system: Secondary | ICD-10-CM | POA: Diagnosis not present

## 2018-10-14 DIAGNOSIS — E785 Hyperlipidemia, unspecified: Secondary | ICD-10-CM | POA: Diagnosis not present

## 2018-10-14 DIAGNOSIS — D751 Secondary polycythemia: Secondary | ICD-10-CM | POA: Diagnosis not present

## 2018-10-14 DIAGNOSIS — E232 Diabetes insipidus: Secondary | ICD-10-CM | POA: Diagnosis not present

## 2018-12-01 DIAGNOSIS — D1801 Hemangioma of skin and subcutaneous tissue: Secondary | ICD-10-CM | POA: Diagnosis not present

## 2018-12-01 DIAGNOSIS — L281 Prurigo nodularis: Secondary | ICD-10-CM | POA: Diagnosis not present

## 2018-12-01 DIAGNOSIS — L821 Other seborrheic keratosis: Secondary | ICD-10-CM | POA: Diagnosis not present

## 2019-05-28 DIAGNOSIS — H01001 Unspecified blepharitis right upper eyelid: Secondary | ICD-10-CM | POA: Diagnosis not present

## 2019-05-28 DIAGNOSIS — H401133 Primary open-angle glaucoma, bilateral, severe stage: Secondary | ICD-10-CM | POA: Diagnosis not present

## 2019-05-28 DIAGNOSIS — H01002 Unspecified blepharitis right lower eyelid: Secondary | ICD-10-CM | POA: Diagnosis not present

## 2019-07-30 DIAGNOSIS — H5010 Unspecified exotropia: Secondary | ICD-10-CM | POA: Diagnosis not present

## 2019-07-30 DIAGNOSIS — H01002 Unspecified blepharitis right lower eyelid: Secondary | ICD-10-CM | POA: Diagnosis not present

## 2019-07-30 DIAGNOSIS — H524 Presbyopia: Secondary | ICD-10-CM | POA: Diagnosis not present

## 2019-07-30 DIAGNOSIS — H401133 Primary open-angle glaucoma, bilateral, severe stage: Secondary | ICD-10-CM | POA: Diagnosis not present

## 2019-07-30 DIAGNOSIS — H01001 Unspecified blepharitis right upper eyelid: Secondary | ICD-10-CM | POA: Diagnosis not present

## 2019-08-07 DIAGNOSIS — Z23 Encounter for immunization: Secondary | ICD-10-CM | POA: Diagnosis not present

## 2019-09-01 DIAGNOSIS — Z8546 Personal history of malignant neoplasm of prostate: Secondary | ICD-10-CM | POA: Diagnosis not present

## 2019-09-01 DIAGNOSIS — D751 Secondary polycythemia: Secondary | ICD-10-CM | POA: Diagnosis not present

## 2019-09-01 DIAGNOSIS — E785 Hyperlipidemia, unspecified: Secondary | ICD-10-CM | POA: Diagnosis not present

## 2019-09-01 DIAGNOSIS — G459 Transient cerebral ischemic attack, unspecified: Secondary | ICD-10-CM | POA: Diagnosis not present

## 2019-09-01 DIAGNOSIS — E559 Vitamin D deficiency, unspecified: Secondary | ICD-10-CM | POA: Diagnosis not present

## 2019-09-01 DIAGNOSIS — Z1389 Encounter for screening for other disorder: Secondary | ICD-10-CM | POA: Diagnosis not present

## 2019-09-01 DIAGNOSIS — D497 Neoplasm of unspecified behavior of endocrine glands and other parts of nervous system: Secondary | ICD-10-CM | POA: Diagnosis not present

## 2019-09-01 DIAGNOSIS — E230 Hypopituitarism: Secondary | ICD-10-CM | POA: Diagnosis not present

## 2019-09-01 DIAGNOSIS — R32 Unspecified urinary incontinence: Secondary | ICD-10-CM | POA: Diagnosis not present

## 2019-09-01 DIAGNOSIS — E232 Diabetes insipidus: Secondary | ICD-10-CM | POA: Diagnosis not present

## 2019-09-01 DIAGNOSIS — H9193 Unspecified hearing loss, bilateral: Secondary | ICD-10-CM | POA: Diagnosis not present

## 2019-09-01 DIAGNOSIS — I10 Essential (primary) hypertension: Secondary | ICD-10-CM | POA: Diagnosis not present

## 2019-09-01 DIAGNOSIS — Z23 Encounter for immunization: Secondary | ICD-10-CM | POA: Diagnosis not present

## 2019-09-01 DIAGNOSIS — Z79899 Other long term (current) drug therapy: Secondary | ICD-10-CM | POA: Diagnosis not present

## 2019-09-01 DIAGNOSIS — Z0000 Encounter for general adult medical examination without abnormal findings: Secondary | ICD-10-CM | POA: Diagnosis not present

## 2019-09-09 DIAGNOSIS — H50111 Monocular exotropia, right eye: Secondary | ICD-10-CM | POA: Diagnosis not present

## 2019-09-09 DIAGNOSIS — H4742 Disorders of optic chiasm in (due to) neoplasm: Secondary | ICD-10-CM | POA: Diagnosis not present

## 2019-09-09 DIAGNOSIS — H47233 Glaucomatous optic atrophy, bilateral: Secondary | ICD-10-CM | POA: Diagnosis not present

## 2019-09-09 DIAGNOSIS — Z961 Presence of intraocular lens: Secondary | ICD-10-CM | POA: Diagnosis not present

## 2019-09-16 DIAGNOSIS — H4742 Disorders of optic chiasm in (due to) neoplasm: Secondary | ICD-10-CM | POA: Diagnosis not present

## 2019-09-16 DIAGNOSIS — Z961 Presence of intraocular lens: Secondary | ICD-10-CM | POA: Diagnosis not present

## 2019-09-16 DIAGNOSIS — H47233 Glaucomatous optic atrophy, bilateral: Secondary | ICD-10-CM | POA: Diagnosis not present

## 2019-09-16 DIAGNOSIS — H50111 Monocular exotropia, right eye: Secondary | ICD-10-CM | POA: Diagnosis not present

## 2019-09-21 ENCOUNTER — Other Ambulatory Visit (HOSPITAL_COMMUNITY)
Admission: RE | Admit: 2019-09-21 | Discharge: 2019-09-21 | Disposition: A | Discharge status: Home / Self Care | Payer: Medicare Other | Source: Ambulatory Visit | Attending: Ophthalmology | Admitting: Ophthalmology

## 2019-09-21 DIAGNOSIS — Z01812 Encounter for preprocedural laboratory examination: Secondary | ICD-10-CM | POA: Diagnosis not present

## 2019-09-21 DIAGNOSIS — Z20828 Contact with and (suspected) exposure to other viral communicable diseases: Secondary | ICD-10-CM | POA: Insufficient documentation

## 2019-09-22 ENCOUNTER — Ambulatory Visit: Payer: Self-pay | Admitting: Ophthalmology

## 2019-09-22 LAB — NOVEL CORONAVIRUS, NAA (HOSP ORDER, SEND-OUT TO REF LAB; TAT 18-24 HRS): SARS-CoV-2, NAA: NOT DETECTED

## 2019-09-22 NOTE — H&P (Deleted)
  The note originally documented on this encounter has been moved the the encounter in which it belongs.  

## 2019-09-22 NOTE — H&P (Signed)
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 Anxiety attack   . Cancer [facility])    prostate  . Glaucoma   . Pituitary tumor     Pertinent ocular history:  Cataract surgery OU; pituitary tumor with prior treatment and significant visual field defects as well as visual deficits to right eye  Pertinent family history: No family history on file.  General:  Healthy appearing patient in no distress.    Eyes:    Acuity OD 20/100  OS 20/25  cc   External: Within normal limits     Anterior segment: PP OU, otherwise WNL  Motility:   55pd RXT  Impression:85yo male with right exotropia desiring repair to prevent visual confusion and possibly help with peripheral fusion  Plan: RMRs, RLRc  M. Lenox Ahr, MD

## 2019-09-23 ENCOUNTER — Encounter (HOSPITAL_COMMUNITY): Payer: Self-pay | Admitting: *Deleted

## 2019-09-23 ENCOUNTER — Other Ambulatory Visit: Payer: Self-pay

## 2019-09-23 NOTE — Anesthesia Preprocedure Evaluation (Addendum)
Anesthesia Evaluation  Patient identified by MRN, date of birth, ID band Patient awake    Reviewed: Allergy & Precautions, NPO status , Patient's Chart, lab work & pertinent test results  Airway Mallampati: I       Dental no notable dental hx. (+) Teeth Intact   Pulmonary neg pulmonary ROS,    Pulmonary exam normal breath sounds clear to auscultation       Cardiovascular negative cardio ROS Normal cardiovascular exam Rhythm:Regular Rate:Normal     Neuro/Psych Anxiety negative neurological ROS     GI/Hepatic negative GI ROS, Neg liver ROS,   Endo/Other  Hypothyroidism   Renal/GU negative Renal ROS  negative genitourinary   Musculoskeletal negative musculoskeletal ROS (+)   Abdominal Normal abdominal exam  (+)   Peds  Hematology negative hematology ROS (+)   Anesthesia Other Findings   Reproductive/Obstetrics                            Anesthesia Physical Anesthesia Plan  ASA: II  Anesthesia Plan: General   Post-op Pain Management:    Induction: Intravenous  PONV Risk Score and Plan: 2 and Ondansetron  Airway Management Planned: Oral ETT and LMA  Additional Equipment:   Intra-op Plan:   Post-operative Plan: Extubation in OR  Informed Consent: I have reviewed the patients History and Physical, chart, labs and discussed the procedure including the risks, benefits and alternatives for the proposed anesthesia with the patient or authorized representative who has indicated his/her understanding and acceptance.     Dental advisory given  Plan Discussed with: CRNA  Anesthesia Plan Comments:        Anesthesia Quick Evaluation

## 2019-09-23 NOTE — Progress Notes (Signed)
Patient denies shortness of breath, fever, cough and chest pain.  PCP - Dr Marcellus Scott  Cardiologist - denies  Chest x-ray - denies EKG - denies Stress Test -  ECHO - 09/10/17 Cardiac Cath - denies  Aspirin Instructions: Follow your surgeon's instructions on when to stop aspirin prior to surgery,  If no instructions were given by your surgeon then you will need to call the office for those instructions.  Anesthesia review: No  STOP now  taking any Aspirin (unless otherwise instructed by your surgeon), Aleve, Naproxen, Ibuprofen, Motrin, Advil, Goody's, BC's, all herbal medications, fish oil, and all vitamins.   Coronavirus Screening Covid test on 09/22/19 was negative.  Patient verbalized understanding of instructions that were given via phone.

## 2019-09-24 ENCOUNTER — Other Ambulatory Visit: Payer: Self-pay

## 2019-09-24 ENCOUNTER — Encounter (HOSPITAL_COMMUNITY): Payer: Self-pay

## 2019-09-24 ENCOUNTER — Ambulatory Visit (HOSPITAL_COMMUNITY): Payer: Medicare Other | Admitting: Certified Registered"

## 2019-09-24 ENCOUNTER — Encounter (HOSPITAL_COMMUNITY)
Admission: RE | Disposition: A | Discharge status: Home / Self Care | Payer: Self-pay | Source: Home / Self Care | Attending: Ophthalmology

## 2019-09-24 ENCOUNTER — Ambulatory Visit (HOSPITAL_COMMUNITY)
Admission: RE | Admit: 2019-09-24 | Discharge: 2019-09-24 | Disposition: A | Discharge status: Home / Self Care | Payer: Medicare Other | Attending: Ophthalmology | Admitting: Ophthalmology

## 2019-09-24 DIAGNOSIS — H5010 Unspecified exotropia: Secondary | ICD-10-CM | POA: Insufficient documentation

## 2019-09-24 DIAGNOSIS — H53001 Unspecified amblyopia, right eye: Secondary | ICD-10-CM | POA: Diagnosis not present

## 2019-09-24 DIAGNOSIS — H5340 Unspecified visual field defects: Secondary | ICD-10-CM | POA: Diagnosis not present

## 2019-09-24 DIAGNOSIS — Z880 Allergy status to penicillin: Secondary | ICD-10-CM | POA: Insufficient documentation

## 2019-09-24 DIAGNOSIS — H409 Unspecified glaucoma: Secondary | ICD-10-CM | POA: Insufficient documentation

## 2019-09-24 DIAGNOSIS — H524 Presbyopia: Secondary | ICD-10-CM | POA: Diagnosis not present

## 2019-09-24 DIAGNOSIS — H50111 Monocular exotropia, right eye: Secondary | ICD-10-CM | POA: Diagnosis not present

## 2019-09-24 DIAGNOSIS — E039 Hypothyroidism, unspecified: Secondary | ICD-10-CM | POA: Insufficient documentation

## 2019-09-24 DIAGNOSIS — H4742 Disorders of optic chiasm in (due to) neoplasm: Secondary | ICD-10-CM | POA: Diagnosis not present

## 2019-09-24 DIAGNOSIS — Z961 Presence of intraocular lens: Secondary | ICD-10-CM | POA: Diagnosis not present

## 2019-09-24 DIAGNOSIS — H47233 Glaucomatous optic atrophy, bilateral: Secondary | ICD-10-CM | POA: Diagnosis not present

## 2019-09-24 HISTORY — DX: Unspecified amblyopia, right eye: H53.001

## 2019-09-24 HISTORY — DX: Hyperlipidemia, unspecified: E78.5

## 2019-09-24 HISTORY — PX: STRABISMUS SURGERY: SHX218

## 2019-09-24 HISTORY — DX: Hypothyroidism, unspecified: E03.9

## 2019-09-24 HISTORY — DX: Unspecified exotropia: H50.10

## 2019-09-24 HISTORY — DX: Monocular exotropia, right eye: H50.111

## 2019-09-24 LAB — CBC
HCT: 49.7 % (ref 39.0–52.0)
Hemoglobin: 16.3 g/dL (ref 13.0–17.0)
MCH: 28 pg (ref 26.0–34.0)
MCHC: 32.8 g/dL (ref 30.0–36.0)
MCV: 85.4 fL (ref 80.0–100.0)
Platelets: 247 10*3/uL (ref 150–400)
RBC: 5.82 MIL/uL — ABNORMAL HIGH (ref 4.22–5.81)
RDW: 14.1 % (ref 11.5–15.5)
WBC: 6.6 10*3/uL (ref 4.0–10.5)
nRBC: 0 % (ref 0.0–0.2)

## 2019-09-24 SURGERY — REPAIR STRABISMUS
Anesthesia: General | Site: Eye | Laterality: Right

## 2019-09-24 MED ORDER — PHENYLEPHRINE HCL 2.5 % OP SOLN
1.0000 [drp] | Freq: Once | OPHTHALMIC | Status: AC
  Administered 2019-09-24: 07:00:00 1 [drp] via OPHTHALMIC

## 2019-09-24 MED ORDER — LIDOCAINE 2% (20 MG/ML) 5 ML SYRINGE
INTRAMUSCULAR | Status: AC
  Filled 2019-09-24: qty 5

## 2019-09-24 MED ORDER — DEXAMETHASONE SODIUM PHOSPHATE 10 MG/ML IJ SOLN
INTRAMUSCULAR | Status: AC
  Filled 2019-09-24: qty 1

## 2019-09-24 MED ORDER — BSS IO SOLN
INTRAOCULAR | Status: DC | PRN
  Administered 2019-09-24: 15 mL

## 2019-09-24 MED ORDER — NEOMYCIN-POLYMYXIN-DEXAMETH 0.1 % OP OINT: 1.0000 "application " | TOPICAL_OINTMENT | Freq: Four times a day (QID) | OPHTHALMIC | 0 refills | Status: DC

## 2019-09-24 MED ORDER — PROPOFOL 10 MG/ML IV BOLUS
INTRAVENOUS | Status: DC | PRN
  Administered 2019-09-24: 150 mg via INTRAVENOUS

## 2019-09-24 MED ORDER — PHENYLEPHRINE HCL 2.5 % OP SOLN
OPHTHALMIC | Status: AC
  Filled 2019-09-24: qty 2

## 2019-09-24 MED ORDER — PHENYLEPHRINE HCL 2.5 % OP SOLN
OPHTHALMIC | Status: AC
  Administered 2019-09-24: 07:00:00 1 [drp] via OPHTHALMIC
  Filled 2019-09-24: qty 2

## 2019-09-24 MED ORDER — NEOMYCIN-POLYMYXIN-DEXAMETH 3.5-10000-0.1 OP OINT
TOPICAL_OINTMENT | OPHTHALMIC | Status: DC | PRN
  Administered 2019-09-24: 1 via OPHTHALMIC

## 2019-09-24 MED ORDER — PROPOFOL 10 MG/ML IV BOLUS
INTRAVENOUS | Status: AC
  Filled 2019-09-24: qty 40

## 2019-09-24 MED ORDER — LACTATED RINGERS IV SOLN
INTRAVENOUS | Status: DC | PRN
  Administered 2019-09-24 (×2): via INTRAVENOUS

## 2019-09-24 MED ORDER — FENTANYL CITRATE (PF) 100 MCG/2ML IJ SOLN
INTRAMUSCULAR | Status: AC
  Filled 2019-09-24: qty 2

## 2019-09-24 MED ORDER — BSS IO SOLN
INTRAOCULAR | Status: AC
  Filled 2019-09-24: qty 15

## 2019-09-24 MED ORDER — ONDANSETRON HCL 4 MG/2ML IJ SOLN
INTRAMUSCULAR | Status: AC
  Filled 2019-09-24: qty 2

## 2019-09-24 MED ORDER — 0.9 % SODIUM CHLORIDE (POUR BTL) OPTIME
TOPICAL | Status: DC | PRN
  Administered 2019-09-24: 08:00:00 1000 mL

## 2019-09-24 MED ORDER — STERILE WATER FOR IRRIGATION IR SOLN
Status: DC | PRN
  Administered 2019-09-24: 1000 mL

## 2019-09-24 MED ORDER — DEXAMETHASONE SODIUM PHOSPHATE 10 MG/ML IJ SOLN
INTRAMUSCULAR | Status: DC | PRN
  Administered 2019-09-24: 10 mg via INTRAVENOUS

## 2019-09-24 MED ORDER — LIDOCAINE 2% (20 MG/ML) 5 ML SYRINGE
INTRAMUSCULAR | Status: DC | PRN
  Administered 2019-09-24: 100 mg via INTRAVENOUS

## 2019-09-24 MED ORDER — NEOMYCIN-POLYMYXIN-DEXAMETH 3.5-10000-0.1 OP OINT
TOPICAL_OINTMENT | OPHTHALMIC | Status: AC
  Filled 2019-09-24: qty 3.5

## 2019-09-24 MED ORDER — ONDANSETRON HCL 4 MG/2ML IJ SOLN
INTRAMUSCULAR | Status: DC | PRN
  Administered 2019-09-24: 4 mg via INTRAVENOUS

## 2019-09-24 MED ORDER — FENTANYL CITRATE (PF) 250 MCG/5ML IJ SOLN
INTRAMUSCULAR | Status: DC | PRN
  Administered 2019-09-24 (×4): 25 ug via INTRAVENOUS

## 2019-09-24 MED ORDER — BUPIVACAINE HCL (PF) 0.5 % IJ SOLN
INTRAMUSCULAR | Status: DC | PRN
  Administered 2019-09-24: 2.5 mL

## 2019-09-24 MED ORDER — BUPIVACAINE HCL (PF) 0.5 % IJ SOLN
INTRAMUSCULAR | Status: AC
  Filled 2019-09-24: qty 30

## 2019-09-24 SURGICAL SUPPLY — 24 items
APPLICATOR COTTON TIP 6 STRL (MISCELLANEOUS) ×2 IMPLANT
APPLICATOR COTTON TIP 6IN STRL (MISCELLANEOUS) ×6
APPLICATOR DR MATTHEWS STRL (MISCELLANEOUS) ×3 IMPLANT
BNDG EYE OVAL (GAUZE/BANDAGES/DRESSINGS) ×2 IMPLANT
CLOSURE WOUND 1/4X4 (GAUZE/BANDAGES/DRESSINGS)
CORD BIPOLAR FORCEPS 12FT (ELECTRODE) ×3 IMPLANT
COVER BACK TABLE 60X90IN (DRAPES) ×3 IMPLANT
COVER MAYO STAND STRL (DRAPES) ×3 IMPLANT
COVER SURGICAL LIGHT HANDLE (MISCELLANEOUS) ×2 IMPLANT
DRAPE EENT ADH APERT 15X15 STR (DRAPES) ×3 IMPLANT
DRAPE ORTHO SPLIT 77X108 STRL (DRAPES) ×2
DRAPE SURG ORHT 6 SPLT 77X108 (DRAPES) ×1 IMPLANT
GLOVE BIO SURGEON STRL SZ7 (GLOVE) ×3 IMPLANT
GLOVE SURG SS PI 6.0 STRL IVOR (GLOVE) ×2 IMPLANT
GOWN STRL REUS W/ TWL LRG LVL3 (GOWN DISPOSABLE) ×2 IMPLANT
GOWN STRL REUS W/TWL LRG LVL3 (GOWN DISPOSABLE) ×4
NS IRRIG 1000ML POUR BTL (IV SOLUTION) ×3 IMPLANT
SPEAR EYE SURG WECK-CEL (MISCELLANEOUS) ×3 IMPLANT
STRIP CLOSURE SKIN 1/4X4 (GAUZE/BANDAGES/DRESSINGS) IMPLANT
SUT CHROMIC 7 0 TG140 8 (SUTURE) ×3 IMPLANT
SUT VICRYL 6 0 S 28 (SUTURE) ×4 IMPLANT
SYR 10ML LL (SYRINGE) ×3 IMPLANT
TOWEL GREEN STERILE FF (TOWEL DISPOSABLE) ×3 IMPLANT
TOWEL OR NON WOVEN STRL DISP B (DISPOSABLE) ×3 IMPLANT

## 2019-09-24 NOTE — Anesthesia Postprocedure Evaluation (Signed)
Anesthesia Post Note  Patient: Alexander Yoder  Procedure(s) Performed: REPAIR STRABISMUS RIGHT EYE (Right Eye)     Patient location during evaluation: Phase II Anesthesia Type: General Level of consciousness: awake Pain management: pain level controlled Vital Signs Assessment: post-procedure vital signs reviewed and stable Respiratory status: spontaneous breathing Cardiovascular status: stable Postop Assessment: no apparent nausea or vomiting Anesthetic complications: no    Last Vitals:  Vitals:   09/24/19 0845 09/24/19 0858  BP: 127/68 122/69  Pulse:  60  Resp:  10  Temp: 36.7 C   SpO2:  98%    Last Pain:  Vitals:   09/24/19 0845  TempSrc:   PainSc: Asleep   Pain Goal: Patients Stated Pain Goal: 4 (09/24/19 0621)                 Huston Foley

## 2019-09-24 NOTE — Transfer of Care (Signed)
Immediate Anesthesia Transfer of Care Note  Patient: Alexander Yoder  Procedure(s) Performed: REPAIR STRABISMUS RIGHT EYE (Right Eye)  Patient Location: PACU  Anesthesia Type:General  Level of Consciousness: drowsy  Airway & Oxygen Therapy: Patient Spontanous Breathing and Patient connected to face mask oxygen  Post-op Assessment: Report given to RN and Post -op Vital signs reviewed and stable  Post vital signs: Reviewed and stable  Last Vitals:  Vitals Value Taken Time  BP 127/68 09/24/19 0839  Temp    Pulse 64 09/24/19 0838  Resp 12 09/24/19 0838  SpO2 99 % 09/24/19 0838  Vitals shown include unvalidated device data.  Last Pain:  Vitals:   09/24/19 0621  TempSrc:   PainSc: 0-No pain      Patients Stated Pain Goal: 4 ([id number])  Complications: No apparent anesthesia complications

## 2019-09-24 NOTE — H&P (Signed)
Interval History and Physical Examination:  Alexander Yoder  09/24/2019  Date of Initial H&P: 09/16/19   The patient has been reexamined and the H&P has been reviewed. The patient has no new complaints. The indications for today's procedure remain valid.  There is no change in the plan of care. There are no medical contraindications for proceeding with today's surgery and we will go forward as planned.  Thomes Cake, MD

## 2019-09-24 NOTE — Op Note (Signed)
09/24/2019  8:34 AM  PATIENT:  Alexander Yoder  85 y.o. 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   ANESTHESIA: General LMA and local subTenons bupivicaine  COMPLICATIONS: None immediate  DESCRIPTION OF PROCEDURE: The patient was taken to the operating room where He was identified by me. General anesthesia was induced without difficulty after placement of appropriate monitors. The patient was prepped and draped in the usual sterile ophthalmic fashion. Maxitrol ointment was placed on the eye for corneal protection.  A lid speculum was placed in the right eye. Forced ductions were unremarkable. Through an incision immediately inferior to the lateral rectus through conjunctiva and Tenon's fascia, the right lateral rectus muscle was engaged on a series of muscle hooks and cleared of its fascial attachments. The tendon was secured with a double-armed 6-0 Vicryl suture with a double locking bite at each border of the muscle, 1 mm from the insertion. The muscle was disinserted, and was reattached to sclera at a measured distance of 10 millimeters posterior to the original insertion, using direct scleral passes in crossed swords fashion.  The suture ends were tied securely after the position of the muscle had been checked and found to be accurate.   Through an incision just inferior to the medial rectus, through conjunctiva and Tenon's fascia, scar tissue was cleared and the medial rectus muscle was found at its expected location 5.69mm posterior to the medial limbus. The muscle was engaged on a series of muscle hooks and cleared of scar and  fascial attachments. The muscle was secured with a double-armed 6-0 Vicryl suture with a double locking bite at each border of the muscle 90mm posterior to the insertion; sutures were brought forward and passed superior to the muscle, through the lateral portion of insertion and exiting the medial portion of the insertion, and back through the medial aspect of the muscle 35mm posterior to the insertion with special care taken to prevent severing of the posterior suture loop. Gentle traction was placed on the sutures with concurrent posterior motion to the muscle, resulting in a muscle loop posterior to the suture and an 70mm shortening of the medial rectus muscle. The suture ends were securely tied and cut.  1.87mL of bupivacaine 0.5% was diffused into the sub-Tenons space inferotemporally for perioperative anesthesia. Conjunctiva was closed with buried interrupted 7-0 Chromic sutures. Maxitrol ointment was placed on the eye again. The patient was awakened without difficulty and taken to the recovery room in stable condition, having suffered no intraoperative or immediate postoperative complications.  Maryelizabeth Kaufmann.D.

## 2019-09-24 NOTE — Anesthesia Procedure Notes (Addendum)
Procedure Name: LMA Insertion Date/Time: 09/24/2019 7:37 AM Performed by: Cleda Daub, CRNA Pre-anesthesia Checklist: Patient identified, Suction available, Emergency Drugs available and Patient being monitored Patient Re-evaluated:Patient Re-evaluated prior to induction Oxygen Delivery Method: Circle system utilized Preoxygenation: Pre-oxygenation with 100% oxygen Induction Type: IV induction Ventilation: Mask ventilation without difficulty LMA: LMA inserted LMA Size: 5.0 Placement Confirmation: breath sounds checked- equal and bilateral and positive ETCO2 Tube secured with: Tape Dental Injury: Teeth and Oropharynx as per pre-operative assessment

## 2019-09-24 NOTE — Progress Notes (Signed)
Both eyes blodshot with r>l / minimal discomfort   / alert /oriented x 4  / ready for d/c / tol po flds w/ out issue

## 2019-09-24 NOTE — Discharge Instructions (Signed)
Diet: Clear liquids, advance to soft foods then regular diet as tolerated.  Pain control: Use these medicines only if they are not already part of your medication regimen prescribed by your doctor.  1)  Ibuprofen 600 mg by mouth every 6-8 hours as needed for pain  2)  Acetaminophen 325 one or two by mouth every 4-6 hours as needed for pain that is not resolved by ibuprofen; may alternate with ibuprofen every 2-3 hours for best results  This regimen has been clinically studied and proven as effective against pain as morphine.  Eye medications:  Antibiotic eye drops or ointment, one drop or application in the operated eye(s) 4 times a day for 10 days.    Activity: No swimming for 1 week. It is OK to let water run over the face and eyes while showering or taking a bath, even during the first week.  No other restriction on exercise or activity.  Eye movement: The eyes may look very slightly crossed in or turned out, and you may experience temporary double vision. This is not unusual postoperatively and may happen up to two months after surgery while the muscles are healing. The eyes may be tired during the first few weeks after surgery; reading can be uncomfortable during the healing process but will not hurt the eyes.  Call Dr. Serita Grit office [phone] with any problems or concerns.

## 2019-09-25 ENCOUNTER — Encounter (HOSPITAL_COMMUNITY): Payer: Self-pay | Admitting: Ophthalmology

## 2019-11-26 DIAGNOSIS — H401133 Primary open-angle glaucoma, bilateral, severe stage: Secondary | ICD-10-CM | POA: Diagnosis not present

## 2019-11-26 DIAGNOSIS — H04123 Dry eye syndrome of bilateral lacrimal glands: Secondary | ICD-10-CM | POA: Diagnosis not present

## 2019-11-26 DIAGNOSIS — H5010 Unspecified exotropia: Secondary | ICD-10-CM | POA: Diagnosis not present

## 2019-11-26 DIAGNOSIS — H01001 Unspecified blepharitis right upper eyelid: Secondary | ICD-10-CM | POA: Diagnosis not present

## 2019-12-02 DIAGNOSIS — L821 Other seborrheic keratosis: Secondary | ICD-10-CM | POA: Diagnosis not present

## 2019-12-02 DIAGNOSIS — C44319 Basal cell carcinoma of skin of other parts of face: Secondary | ICD-10-CM | POA: Diagnosis not present

## 2019-12-02 DIAGNOSIS — D485 Neoplasm of uncertain behavior of skin: Secondary | ICD-10-CM | POA: Diagnosis not present

## 2019-12-02 DIAGNOSIS — L570 Actinic keratosis: Secondary | ICD-10-CM | POA: Diagnosis not present

## 2019-12-02 DIAGNOSIS — D1801 Hemangioma of skin and subcutaneous tissue: Secondary | ICD-10-CM | POA: Diagnosis not present

## 2020-02-13 DIAGNOSIS — Z880 Allergy status to penicillin: Secondary | ICD-10-CM | POA: Diagnosis not present

## 2020-02-13 DIAGNOSIS — I700 Atherosclerosis of aorta: Secondary | ICD-10-CM | POA: Diagnosis not present

## 2020-02-13 DIAGNOSIS — R197 Diarrhea, unspecified: Secondary | ICD-10-CM | POA: Diagnosis present

## 2020-02-13 DIAGNOSIS — E860 Dehydration: Secondary | ICD-10-CM | POA: Diagnosis not present

## 2020-02-13 DIAGNOSIS — R6510 Systemic inflammatory response syndrome (SIRS) of non-infectious origin without acute organ dysfunction: Secondary | ICD-10-CM | POA: Diagnosis not present

## 2020-02-13 DIAGNOSIS — R1111 Vomiting without nausea: Secondary | ICD-10-CM | POA: Diagnosis not present

## 2020-02-13 DIAGNOSIS — E236 Other disorders of pituitary gland: Secondary | ICD-10-CM | POA: Diagnosis present

## 2020-02-13 DIAGNOSIS — H409 Unspecified glaucoma: Secondary | ICD-10-CM | POA: Diagnosis present

## 2020-02-13 DIAGNOSIS — Z7982 Long term (current) use of aspirin: Secondary | ICD-10-CM | POA: Diagnosis not present

## 2020-02-13 DIAGNOSIS — R0902 Hypoxemia: Secondary | ICD-10-CM | POA: Diagnosis not present

## 2020-02-13 DIAGNOSIS — Z79899 Other long term (current) drug therapy: Secondary | ICD-10-CM | POA: Diagnosis not present

## 2020-02-13 DIAGNOSIS — R110 Nausea: Secondary | ICD-10-CM | POA: Diagnosis not present

## 2020-02-13 DIAGNOSIS — E039 Hypothyroidism, unspecified: Secondary | ICD-10-CM | POA: Diagnosis present

## 2020-02-13 DIAGNOSIS — E785 Hyperlipidemia, unspecified: Secondary | ICD-10-CM | POA: Diagnosis present

## 2020-02-13 DIAGNOSIS — K5790 Diverticulosis of intestine, part unspecified, without perforation or abscess without bleeding: Secondary | ICD-10-CM | POA: Diagnosis present

## 2020-02-13 DIAGNOSIS — K8020 Calculus of gallbladder without cholecystitis without obstruction: Secondary | ICD-10-CM | POA: Diagnosis present

## 2020-02-13 DIAGNOSIS — R748 Abnormal levels of other serum enzymes: Secondary | ICD-10-CM | POA: Diagnosis present

## 2020-02-13 DIAGNOSIS — R112 Nausea with vomiting, unspecified: Secondary | ICD-10-CM | POA: Diagnosis not present

## 2020-02-13 DIAGNOSIS — R1110 Vomiting, unspecified: Secondary | ICD-10-CM | POA: Diagnosis not present

## 2020-02-13 DIAGNOSIS — T59891A Toxic effect of other specified gases, fumes and vapors, accidental (unintentional), initial encounter: Secondary | ICD-10-CM | POA: Diagnosis present

## 2020-02-13 DIAGNOSIS — R509 Fever, unspecified: Secondary | ICD-10-CM | POA: Diagnosis not present

## 2020-02-13 DIAGNOSIS — R17 Unspecified jaundice: Secondary | ICD-10-CM | POA: Diagnosis present

## 2020-02-14 DIAGNOSIS — R748 Abnormal levels of other serum enzymes: Secondary | ICD-10-CM | POA: Diagnosis not present

## 2020-02-14 DIAGNOSIS — R17 Unspecified jaundice: Secondary | ICD-10-CM | POA: Diagnosis not present

## 2020-02-14 DIAGNOSIS — R509 Fever, unspecified: Secondary | ICD-10-CM | POA: Diagnosis not present

## 2020-02-14 DIAGNOSIS — E860 Dehydration: Secondary | ICD-10-CM | POA: Diagnosis not present

## 2020-02-15 DIAGNOSIS — R509 Fever, unspecified: Secondary | ICD-10-CM | POA: Diagnosis not present

## 2020-02-15 DIAGNOSIS — E860 Dehydration: Secondary | ICD-10-CM | POA: Diagnosis not present

## 2020-02-15 DIAGNOSIS — R17 Unspecified jaundice: Secondary | ICD-10-CM | POA: Diagnosis not present

## 2020-02-15 DIAGNOSIS — R748 Abnormal levels of other serum enzymes: Secondary | ICD-10-CM | POA: Diagnosis not present

## 2020-02-16 DIAGNOSIS — E860 Dehydration: Secondary | ICD-10-CM | POA: Diagnosis not present

## 2020-02-16 DIAGNOSIS — R748 Abnormal levels of other serum enzymes: Secondary | ICD-10-CM | POA: Diagnosis not present

## 2020-02-16 DIAGNOSIS — R17 Unspecified jaundice: Secondary | ICD-10-CM | POA: Diagnosis not present

## 2020-02-16 DIAGNOSIS — R509 Fever, unspecified: Secondary | ICD-10-CM | POA: Diagnosis not present

## 2020-02-29 DIAGNOSIS — K529 Noninfective gastroenteritis and colitis, unspecified: Secondary | ICD-10-CM | POA: Diagnosis not present

## 2020-03-28 DIAGNOSIS — H01001 Unspecified blepharitis right upper eyelid: Secondary | ICD-10-CM | POA: Diagnosis not present

## 2020-03-28 DIAGNOSIS — T1502XA Foreign body in cornea, left eye, initial encounter: Secondary | ICD-10-CM | POA: Diagnosis not present

## 2020-03-28 DIAGNOSIS — H401133 Primary open-angle glaucoma, bilateral, severe stage: Secondary | ICD-10-CM | POA: Diagnosis not present

## 2020-03-28 DIAGNOSIS — H04123 Dry eye syndrome of bilateral lacrimal glands: Secondary | ICD-10-CM | POA: Diagnosis not present

## 2020-04-04 DIAGNOSIS — H401133 Primary open-angle glaucoma, bilateral, severe stage: Secondary | ICD-10-CM | POA: Diagnosis not present

## 2020-04-04 DIAGNOSIS — H04123 Dry eye syndrome of bilateral lacrimal glands: Secondary | ICD-10-CM | POA: Diagnosis not present

## 2020-04-04 DIAGNOSIS — H01001 Unspecified blepharitis right upper eyelid: Secondary | ICD-10-CM | POA: Diagnosis not present

## 2020-04-22 DIAGNOSIS — R413 Other amnesia: Secondary | ICD-10-CM | POA: Diagnosis not present

## 2020-06-15 ENCOUNTER — Other Ambulatory Visit: Payer: Self-pay | Admitting: Internal Medicine

## 2020-06-15 ENCOUNTER — Ambulatory Visit
Admission: RE | Admit: 2020-06-15 | Discharge: 2020-06-15 | Disposition: A | Discharge status: Home / Self Care | Payer: Medicare Other | Source: Ambulatory Visit | Attending: Internal Medicine | Admitting: Internal Medicine

## 2020-06-15 DIAGNOSIS — M545 Low back pain, unspecified: Secondary | ICD-10-CM

## 2020-06-15 DIAGNOSIS — M5136 Other intervertebral disc degeneration, lumbar region: Secondary | ICD-10-CM | POA: Diagnosis not present

## 2020-06-15 DIAGNOSIS — I700 Atherosclerosis of aorta: Secondary | ICD-10-CM | POA: Diagnosis not present

## 2020-08-08 DIAGNOSIS — H01001 Unspecified blepharitis right upper eyelid: Secondary | ICD-10-CM | POA: Diagnosis not present

## 2020-08-08 DIAGNOSIS — H5010 Unspecified exotropia: Secondary | ICD-10-CM | POA: Diagnosis not present

## 2020-08-08 DIAGNOSIS — H401133 Primary open-angle glaucoma, bilateral, severe stage: Secondary | ICD-10-CM | POA: Diagnosis not present

## 2020-09-06 DIAGNOSIS — Z23 Encounter for immunization: Secondary | ICD-10-CM | POA: Diagnosis not present

## 2020-09-22 DIAGNOSIS — E230 Hypopituitarism: Secondary | ICD-10-CM | POA: Diagnosis not present

## 2020-09-22 DIAGNOSIS — R32 Unspecified urinary incontinence: Secondary | ICD-10-CM | POA: Diagnosis not present

## 2020-09-22 DIAGNOSIS — Z0000 Encounter for general adult medical examination without abnormal findings: Secondary | ICD-10-CM | POA: Diagnosis not present

## 2020-09-22 DIAGNOSIS — E237 Disorder of pituitary gland, unspecified: Secondary | ICD-10-CM | POA: Diagnosis not present

## 2020-09-22 DIAGNOSIS — H9193 Unspecified hearing loss, bilateral: Secondary | ICD-10-CM | POA: Diagnosis not present

## 2020-09-22 DIAGNOSIS — J309 Allergic rhinitis, unspecified: Secondary | ICD-10-CM | POA: Diagnosis not present

## 2020-09-22 DIAGNOSIS — Z1389 Encounter for screening for other disorder: Secondary | ICD-10-CM | POA: Diagnosis not present

## 2020-09-22 DIAGNOSIS — E559 Vitamin D deficiency, unspecified: Secondary | ICD-10-CM | POA: Diagnosis not present

## 2020-09-22 DIAGNOSIS — M545 Low back pain, unspecified: Secondary | ICD-10-CM | POA: Diagnosis not present

## 2020-09-22 DIAGNOSIS — J329 Chronic sinusitis, unspecified: Secondary | ICD-10-CM | POA: Diagnosis not present

## 2020-09-22 DIAGNOSIS — Z79899 Other long term (current) drug therapy: Secondary | ICD-10-CM | POA: Diagnosis not present

## 2020-09-22 DIAGNOSIS — M5416 Radiculopathy, lumbar region: Secondary | ICD-10-CM | POA: Diagnosis not present

## 2020-09-22 DIAGNOSIS — R413 Other amnesia: Secondary | ICD-10-CM | POA: Diagnosis not present

## 2020-09-22 DIAGNOSIS — I10 Essential (primary) hypertension: Secondary | ICD-10-CM | POA: Diagnosis not present

## 2020-10-07 ENCOUNTER — Other Ambulatory Visit: Payer: Self-pay | Admitting: Internal Medicine

## 2020-10-07 DIAGNOSIS — E236 Other disorders of pituitary gland: Secondary | ICD-10-CM

## 2020-10-09 ENCOUNTER — Ambulatory Visit
Admission: RE | Admit: 2020-10-09 | Discharge: 2020-10-09 | Disposition: A | Discharge status: Home / Self Care | Payer: Medicare Other | Source: Ambulatory Visit | Attending: Internal Medicine | Admitting: Internal Medicine

## 2020-10-09 DIAGNOSIS — D352 Benign neoplasm of pituitary gland: Secondary | ICD-10-CM | POA: Diagnosis not present

## 2020-10-09 DIAGNOSIS — E236 Other disorders of pituitary gland: Secondary | ICD-10-CM

## 2020-10-09 MED ORDER — GADOBENATE DIMEGLUMINE 529 MG/ML IV SOLN
9.0000 mL | Freq: Once | INTRAVENOUS | Status: AC | PRN
  Administered 2020-10-09: 9 mL via INTRAVENOUS

## 2020-10-10 ENCOUNTER — Other Ambulatory Visit: Payer: Self-pay | Admitting: Internal Medicine

## 2020-10-10 DIAGNOSIS — M5416 Radiculopathy, lumbar region: Secondary | ICD-10-CM

## 2020-10-15 ENCOUNTER — Ambulatory Visit
Admission: RE | Admit: 2020-10-15 | Discharge: 2020-10-15 | Disposition: A | Discharge status: Home / Self Care | Payer: Medicare Other | Source: Ambulatory Visit | Attending: Internal Medicine | Admitting: Internal Medicine

## 2020-10-15 ENCOUNTER — Other Ambulatory Visit: Payer: Self-pay

## 2020-10-15 DIAGNOSIS — M5416 Radiculopathy, lumbar region: Secondary | ICD-10-CM

## 2020-10-15 DIAGNOSIS — M48061 Spinal stenosis, lumbar region without neurogenic claudication: Secondary | ICD-10-CM | POA: Diagnosis not present

## 2020-10-15 DIAGNOSIS — M545 Low back pain, unspecified: Secondary | ICD-10-CM | POA: Diagnosis not present

## 2020-10-21 DIAGNOSIS — E237 Disorder of pituitary gland, unspecified: Secondary | ICD-10-CM | POA: Diagnosis not present

## 2020-10-21 DIAGNOSIS — M5416 Radiculopathy, lumbar region: Secondary | ICD-10-CM | POA: Diagnosis not present

## 2020-10-21 DIAGNOSIS — I10 Essential (primary) hypertension: Secondary | ICD-10-CM | POA: Diagnosis not present

## 2020-10-21 DIAGNOSIS — E230 Hypopituitarism: Secondary | ICD-10-CM | POA: Diagnosis not present

## 2020-11-01 DIAGNOSIS — G8929 Other chronic pain: Secondary | ICD-10-CM | POA: Diagnosis not present

## 2020-11-01 DIAGNOSIS — M545 Low back pain, unspecified: Secondary | ICD-10-CM | POA: Diagnosis not present

## 2020-11-22 DIAGNOSIS — E232 Diabetes insipidus: Secondary | ICD-10-CM | POA: Diagnosis not present

## 2020-11-22 DIAGNOSIS — E230 Hypopituitarism: Secondary | ICD-10-CM | POA: Diagnosis not present

## 2020-11-22 DIAGNOSIS — Z8546 Personal history of malignant neoplasm of prostate: Secondary | ICD-10-CM | POA: Diagnosis not present

## 2020-11-22 DIAGNOSIS — D497 Neoplasm of unspecified behavior of endocrine glands and other parts of nervous system: Secondary | ICD-10-CM | POA: Diagnosis not present

## 2020-11-29 DIAGNOSIS — E232 Diabetes insipidus: Secondary | ICD-10-CM | POA: Diagnosis not present

## 2020-11-29 DIAGNOSIS — Z79899 Other long term (current) drug therapy: Secondary | ICD-10-CM | POA: Diagnosis not present

## 2020-11-29 DIAGNOSIS — E230 Hypopituitarism: Secondary | ICD-10-CM | POA: Diagnosis not present

## 2021-01-02 DIAGNOSIS — H5010 Unspecified exotropia: Secondary | ICD-10-CM | POA: Diagnosis not present

## 2021-01-02 DIAGNOSIS — H401133 Primary open-angle glaucoma, bilateral, severe stage: Secondary | ICD-10-CM | POA: Diagnosis not present

## 2021-01-02 DIAGNOSIS — H01001 Unspecified blepharitis right upper eyelid: Secondary | ICD-10-CM | POA: Diagnosis not present

## 2021-01-02 DIAGNOSIS — H524 Presbyopia: Secondary | ICD-10-CM | POA: Diagnosis not present

## 2021-01-11 DIAGNOSIS — R531 Weakness: Secondary | ICD-10-CM | POA: Diagnosis not present

## 2021-01-11 DIAGNOSIS — M2560 Stiffness of unspecified joint, not elsewhere classified: Secondary | ICD-10-CM | POA: Diagnosis not present

## 2021-01-11 DIAGNOSIS — M545 Low back pain, unspecified: Secondary | ICD-10-CM | POA: Diagnosis not present

## 2021-01-12 DIAGNOSIS — E230 Hypopituitarism: Secondary | ICD-10-CM | POA: Diagnosis not present

## 2021-01-13 DIAGNOSIS — M2560 Stiffness of unspecified joint, not elsewhere classified: Secondary | ICD-10-CM | POA: Diagnosis not present

## 2021-01-13 DIAGNOSIS — M545 Low back pain, unspecified: Secondary | ICD-10-CM | POA: Diagnosis not present

## 2021-01-13 DIAGNOSIS — R531 Weakness: Secondary | ICD-10-CM | POA: Diagnosis not present

## 2021-01-20 DIAGNOSIS — M545 Low back pain, unspecified: Secondary | ICD-10-CM | POA: Diagnosis not present

## 2021-01-20 DIAGNOSIS — R531 Weakness: Secondary | ICD-10-CM | POA: Diagnosis not present

## 2021-01-20 DIAGNOSIS — M2560 Stiffness of unspecified joint, not elsewhere classified: Secondary | ICD-10-CM | POA: Diagnosis not present

## 2021-01-24 DIAGNOSIS — M545 Low back pain, unspecified: Secondary | ICD-10-CM | POA: Diagnosis not present

## 2021-01-24 DIAGNOSIS — R531 Weakness: Secondary | ICD-10-CM | POA: Diagnosis not present

## 2021-01-24 DIAGNOSIS — M2560 Stiffness of unspecified joint, not elsewhere classified: Secondary | ICD-10-CM | POA: Diagnosis not present

## 2021-01-27 DIAGNOSIS — M2560 Stiffness of unspecified joint, not elsewhere classified: Secondary | ICD-10-CM | POA: Diagnosis not present

## 2021-01-27 DIAGNOSIS — M545 Low back pain, unspecified: Secondary | ICD-10-CM | POA: Diagnosis not present

## 2021-01-27 DIAGNOSIS — R531 Weakness: Secondary | ICD-10-CM | POA: Diagnosis not present

## 2021-01-31 DIAGNOSIS — R531 Weakness: Secondary | ICD-10-CM | POA: Diagnosis not present

## 2021-01-31 DIAGNOSIS — M2560 Stiffness of unspecified joint, not elsewhere classified: Secondary | ICD-10-CM | POA: Diagnosis not present

## 2021-01-31 DIAGNOSIS — M545 Low back pain, unspecified: Secondary | ICD-10-CM | POA: Diagnosis not present

## 2021-02-03 DIAGNOSIS — M545 Low back pain, unspecified: Secondary | ICD-10-CM | POA: Diagnosis not present

## 2021-02-03 DIAGNOSIS — M2560 Stiffness of unspecified joint, not elsewhere classified: Secondary | ICD-10-CM | POA: Diagnosis not present

## 2021-02-03 DIAGNOSIS — R531 Weakness: Secondary | ICD-10-CM | POA: Diagnosis not present

## 2021-02-07 DIAGNOSIS — M545 Low back pain, unspecified: Secondary | ICD-10-CM | POA: Diagnosis not present

## 2021-02-07 DIAGNOSIS — R531 Weakness: Secondary | ICD-10-CM | POA: Diagnosis not present

## 2021-02-07 DIAGNOSIS — M2560 Stiffness of unspecified joint, not elsewhere classified: Secondary | ICD-10-CM | POA: Diagnosis not present

## 2021-02-10 DIAGNOSIS — R531 Weakness: Secondary | ICD-10-CM | POA: Diagnosis not present

## 2021-02-10 DIAGNOSIS — M545 Low back pain, unspecified: Secondary | ICD-10-CM | POA: Diagnosis not present

## 2021-02-10 DIAGNOSIS — M2560 Stiffness of unspecified joint, not elsewhere classified: Secondary | ICD-10-CM | POA: Diagnosis not present

## 2021-02-14 DIAGNOSIS — M2560 Stiffness of unspecified joint, not elsewhere classified: Secondary | ICD-10-CM | POA: Diagnosis not present

## 2021-02-14 DIAGNOSIS — M545 Low back pain, unspecified: Secondary | ICD-10-CM | POA: Diagnosis not present

## 2021-02-14 DIAGNOSIS — R531 Weakness: Secondary | ICD-10-CM | POA: Diagnosis not present

## 2021-02-17 DIAGNOSIS — R531 Weakness: Secondary | ICD-10-CM | POA: Diagnosis not present

## 2021-02-17 DIAGNOSIS — M2560 Stiffness of unspecified joint, not elsewhere classified: Secondary | ICD-10-CM | POA: Diagnosis not present

## 2021-02-17 DIAGNOSIS — M545 Low back pain, unspecified: Secondary | ICD-10-CM | POA: Diagnosis not present

## 2021-02-21 DIAGNOSIS — M545 Low back pain, unspecified: Secondary | ICD-10-CM | POA: Diagnosis not present

## 2021-02-21 DIAGNOSIS — R531 Weakness: Secondary | ICD-10-CM | POA: Diagnosis not present

## 2021-02-21 DIAGNOSIS — M2560 Stiffness of unspecified joint, not elsewhere classified: Secondary | ICD-10-CM | POA: Diagnosis not present

## 2021-02-24 DIAGNOSIS — R531 Weakness: Secondary | ICD-10-CM | POA: Diagnosis not present

## 2021-02-24 DIAGNOSIS — M545 Low back pain, unspecified: Secondary | ICD-10-CM | POA: Diagnosis not present

## 2021-02-24 DIAGNOSIS — M2560 Stiffness of unspecified joint, not elsewhere classified: Secondary | ICD-10-CM | POA: Diagnosis not present

## 2021-02-28 DIAGNOSIS — M545 Low back pain, unspecified: Secondary | ICD-10-CM | POA: Diagnosis not present

## 2021-02-28 DIAGNOSIS — R531 Weakness: Secondary | ICD-10-CM | POA: Diagnosis not present

## 2021-02-28 DIAGNOSIS — M2560 Stiffness of unspecified joint, not elsewhere classified: Secondary | ICD-10-CM | POA: Diagnosis not present

## 2021-03-03 DIAGNOSIS — R531 Weakness: Secondary | ICD-10-CM | POA: Diagnosis not present

## 2021-03-03 DIAGNOSIS — M2560 Stiffness of unspecified joint, not elsewhere classified: Secondary | ICD-10-CM | POA: Diagnosis not present

## 2021-03-03 DIAGNOSIS — M545 Low back pain, unspecified: Secondary | ICD-10-CM | POA: Diagnosis not present

## 2021-03-10 DIAGNOSIS — R531 Weakness: Secondary | ICD-10-CM | POA: Diagnosis not present

## 2021-03-10 DIAGNOSIS — M2560 Stiffness of unspecified joint, not elsewhere classified: Secondary | ICD-10-CM | POA: Diagnosis not present

## 2021-03-10 DIAGNOSIS — M545 Low back pain, unspecified: Secondary | ICD-10-CM | POA: Diagnosis not present

## 2021-03-28 DIAGNOSIS — Z87891 Personal history of nicotine dependence: Secondary | ICD-10-CM | POA: Diagnosis not present

## 2021-03-28 DIAGNOSIS — E785 Hyperlipidemia, unspecified: Secondary | ICD-10-CM | POA: Diagnosis not present

## 2021-03-28 DIAGNOSIS — Z79899 Other long term (current) drug therapy: Secondary | ICD-10-CM | POA: Diagnosis not present

## 2021-03-28 DIAGNOSIS — I639 Cerebral infarction, unspecified: Secondary | ICD-10-CM | POA: Diagnosis not present

## 2021-03-28 DIAGNOSIS — R410 Disorientation, unspecified: Secondary | ICD-10-CM | POA: Diagnosis not present

## 2021-03-28 DIAGNOSIS — Z86018 Personal history of other benign neoplasm: Secondary | ICD-10-CM | POA: Diagnosis not present

## 2021-03-28 DIAGNOSIS — E039 Hypothyroidism, unspecified: Secondary | ICD-10-CM | POA: Diagnosis not present

## 2021-03-28 DIAGNOSIS — Z880 Allergy status to penicillin: Secondary | ICD-10-CM | POA: Diagnosis not present

## 2021-03-28 DIAGNOSIS — M1990 Unspecified osteoarthritis, unspecified site: Secondary | ICD-10-CM | POA: Diagnosis not present

## 2021-03-28 DIAGNOSIS — Z7982 Long term (current) use of aspirin: Secondary | ICD-10-CM | POA: Diagnosis not present

## 2021-03-28 DIAGNOSIS — I674 Hypertensive encephalopathy: Secondary | ICD-10-CM | POA: Diagnosis not present

## 2021-03-28 DIAGNOSIS — I651 Occlusion and stenosis of basilar artery: Secondary | ICD-10-CM | POA: Diagnosis not present

## 2021-03-28 DIAGNOSIS — I6609 Occlusion and stenosis of unspecified middle cerebral artery: Secondary | ICD-10-CM | POA: Diagnosis not present

## 2021-03-28 DIAGNOSIS — I361 Nonrheumatic tricuspid (valve) insufficiency: Secondary | ICD-10-CM

## 2021-03-28 DIAGNOSIS — I340 Nonrheumatic mitral (valve) insufficiency: Secondary | ICD-10-CM

## 2021-03-29 DIAGNOSIS — I651 Occlusion and stenosis of basilar artery: Secondary | ICD-10-CM | POA: Diagnosis not present

## 2021-03-29 DIAGNOSIS — I639 Cerebral infarction, unspecified: Secondary | ICD-10-CM | POA: Diagnosis not present

## 2021-03-29 DIAGNOSIS — I674 Hypertensive encephalopathy: Secondary | ICD-10-CM | POA: Diagnosis not present

## 2021-03-29 DIAGNOSIS — I6523 Occlusion and stenosis of bilateral carotid arteries: Secondary | ICD-10-CM | POA: Diagnosis not present

## 2021-03-29 DIAGNOSIS — I6609 Occlusion and stenosis of unspecified middle cerebral artery: Secondary | ICD-10-CM | POA: Diagnosis not present

## 2021-04-03 DIAGNOSIS — M1990 Unspecified osteoarthritis, unspecified site: Secondary | ICD-10-CM | POA: Diagnosis not present

## 2021-04-03 DIAGNOSIS — M5136 Other intervertebral disc degeneration, lumbar region: Secondary | ICD-10-CM | POA: Diagnosis not present

## 2021-04-04 DIAGNOSIS — I6529 Occlusion and stenosis of unspecified carotid artery: Secondary | ICD-10-CM | POA: Diagnosis not present

## 2021-04-04 DIAGNOSIS — Z86018 Personal history of other benign neoplasm: Secondary | ICD-10-CM | POA: Diagnosis not present

## 2021-04-04 DIAGNOSIS — E785 Hyperlipidemia, unspecified: Secondary | ICD-10-CM | POA: Diagnosis not present

## 2021-04-04 DIAGNOSIS — G458 Other transient cerebral ischemic attacks and related syndromes: Secondary | ICD-10-CM | POA: Diagnosis not present

## 2021-04-04 DIAGNOSIS — I350 Nonrheumatic aortic (valve) stenosis: Secondary | ICD-10-CM | POA: Diagnosis not present

## 2021-04-04 DIAGNOSIS — I6609 Occlusion and stenosis of unspecified middle cerebral artery: Secondary | ICD-10-CM | POA: Diagnosis not present

## 2021-04-04 DIAGNOSIS — I651 Occlusion and stenosis of basilar artery: Secondary | ICD-10-CM | POA: Diagnosis not present

## 2021-04-18 ENCOUNTER — Encounter: Payer: Self-pay | Admitting: Vascular Surgery

## 2021-04-18 ENCOUNTER — Other Ambulatory Visit: Payer: Self-pay

## 2021-04-18 ENCOUNTER — Ambulatory Visit (INDEPENDENT_AMBULATORY_CARE_PROVIDER_SITE_OTHER): Payer: Medicare Other | Admitting: Vascular Surgery

## 2021-04-18 VITALS — BP 151/75 | HR 56 | Temp 98.2°F | Resp 20 | Ht 70.0 in | Wt 176.0 lb

## 2021-04-18 DIAGNOSIS — I708 Atherosclerosis of other arteries: Secondary | ICD-10-CM | POA: Diagnosis not present

## 2021-04-18 NOTE — Progress Notes (Signed)
[facility] [place]  ASSESSMENT / PLAN: Alexander Yoder is a 87 y.o. male with chronic, compensated left subclavian artery occlusion.  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  The patient should continue best medical therapy for atherosclerosis including: Complete cessation from all tobacco products. Blood glucose control with goal A1c < 7%. Blood pressure control with goal blood pressure < 140/90 mmHg. Lipid reduction therapy with goal LDL-C <100 mg/dL (<70 if symptomatic from carotid artery stenosis).  Aspirin 81mg  PO QD.  Atorvastatin 40-80mg  PO QD (or other "high intensity" statin therapy).  Counseled the patient extensively about the natural history of subclavian artery occlusion.  He appears very well compensated from the standpoint.  He has no symptoms referable to this occlusion.  He has a palpable pulse of his left wrist.  Intracranial arterial disease is not within my scope of practice.  A neurologist, neurosurgeon or neurointerventionalist could offer an opinion on the basilar artery and right MCA stenoses.  I recommend continued medical therapy as above.  CHIEF COMPLAINT: Recent TIA  HISTORY OF PRESENT ILLNESS: Alexander Yoder is a 87 y.o. male referred to clinic for evaluation of CTA findings in setting of recent TIA.  Patient reports he had an episode of confusion prompting him to call EMS for urgent evaluation.  Skin note from his primary care physician also suggest that he may have had left upper extremity weakness at that time.  He recovered completely from this.  A stroke work-up was initiated.  CTA performed at the time was significant for severe stenosis of the basilar artery, right proximal MCA stenosis, left proximal subclavian artery occlusion.  The patient reports he is recovered completely from this event.  He has no symptoms in his left upper extremity.  He feels the strength is equal in both  upper extremities.  He has noticed in the past that his blood pressure is lower in the left arm.  He has no symptoms of claudication, ischemic rest pain, or ischemic ulceration in the left arm.  He has no symptoms typical for vertebrobasilar insufficiency.  He reports no symptoms of dizziness or instability when using his left arm vigorously.  He reports a good exercise tolerance.  He for a form and his debilitated wife with some assistance.  Past Medical History:  Diagnosis Date  . Amblyopia of eye, right   . Anxiety attack   . Cancer [facility])    prostate  . Exotropia of right eye   . Glaucoma   . Hyperlipidemia   . Hypothyroidism   . Pituitary tumor     Past Surgical History:  Procedure Laterality Date  . COLONOSCOPY    . EYE SURGERY Bilateral    cataracts removed  . PROSTATECTOMY  2000  . STRABISMUS SURGERY Right 09/24/2019   Procedure: REPAIR STRABISMUS RIGHT EYE;  Surgeon: Lamonte Sakai, MD;  Location: [place];  Service: Ophthalmology;  Laterality: Right;  . TONSILLECTOMY    . TRANSPHENOIDAL / TRANSNASAL HYPOPHYSECTOMY / RESECTION PITUITARY TUMOR  1998, 2010   x2 -  . WISDOM TOOTH EXTRACTION      History reviewed. No pertinent family history.  Social History   Socioeconomic History  . Marital status: Married    Spouse name: Not on file  . Number of children: Not on file  . Years of education: Not on file  . Highest education level: Not on file  Occupational History  . Not on file  Tobacco Use  .  Smoking status: Never Smoker  . Smokeless tobacco: Never Used  Vaping Use  . Vaping Use: Never used  Substance and Sexual Activity  . Alcohol use: No  . Drug use: Never  . Sexual activity: Not on file  Other Topics Concern  . Not on file  Social History Narrative  . Not on file   Social Determinants of Health   Financial Resource Strain: Not on file  Food Insecurity: Not on file  Transportation Needs: Not on file  Physical Activity: Not on file  Stress: Not on file   Social Connections: Not on file  Intimate Partner Violence: Not on file    Allergies  Allergen Reactions  . Peanut-Containing Drug Products Anaphylaxis  . Penicillins Other (See Comments)    Did it involve swelling of the face/tongue/throat, SOB, or low BP? Yes Did it involve sudden or severe rash/hives, skin peeling, or any reaction on the inside of your mouth or nose? Yes Did you need to seek medical attention at a hospital or doctor's office? Yes When did it last happen?2010 If all above answers are "NO", may proceed with cephalosporin use.     Current Outpatient Medications  Medication Sig Dispense Refill  . acetaminophen (TYLENOL) 500 MG tablet Take 500 mg by mouth every 6 (six) hours as needed.    Marland Kitchen aspirin EC 81 MG tablet Take 81 mg by mouth daily.    . B Complex-C (B-COMPLEX WITH VITAMIN C) tablet Take 1 tablet by mouth daily.    . brimonidine (ALPHAGAN) 0.15 % ophthalmic solution Place 1 drop into both eyes 2 (two) times daily.     . Cholecalciferol (VITAMIN D) 50 MCG (2000 UT) CAPS Take 2,000 Units by mouth daily.    . clopidogrel (PLAVIX) 75 MG tablet Take 1 tablet by mouth daily.    Marland Kitchen desmopressin (DDAVP) 0.2 MG tablet Take 0.2-0.4 mg by mouth See admin instructions. 2 tabs in the morning, and 1 tab in the evening    . hydrocortisone (CORTEF) 10 MG tablet Take 5-10 mg by mouth See admin instructions. 10 mg in the morning, 5 mg in the evening    . latanoprost (XALATAN) 0.005 % ophthalmic solution Place 1 drop into both eyes at bedtime.    Marland Kitchen neomycin-polymyxin-dexameth (MAXITROL) 0.1 % OINT Place 1 application into the right eye 4 (four) times daily. For 1 week  0  . Omega-3 Fatty Acids (FISH OIL) 1000 MG CAPS Take 1,000 mg by mouth daily.    . rosuvastatin (CRESTOR) 20 MG tablet Take 5 mg by mouth every Monday, Wednesday, and Friday.     Marland Kitchen SYNTHROID 112 MCG tablet Take 112 mcg by mouth daily before breakfast.     . testosterone cypionate (DEPOTESTOSTERONE CYPIONATE)  200 MG/ML injection SMARTSIG:0.8 Milliliter(s) IM Every 2 Weeks     No current facility-administered medications for this visit.    REVIEW OF SYSTEMS:  [X]  denotes positive finding, [ ]  denotes negative finding Cardiac  Comments:  Chest pain or chest pressure:    Shortness of breath upon exertion:    Short of breath when lying flat:    Irregular heart rhythm:        Vascular    Pain in calf, thigh, or hip brought on by ambulation:    Pain in feet at night that wakes you up from your sleep:     Blood clot in your veins:    Leg swelling:         Pulmonary  Oxygen at home:    Productive cough:     Wheezing:         Neurologic    Sudden weakness in arms or legs:     Sudden numbness in arms or legs:     Sudden onset of difficulty speaking or slurred speech:    Temporary loss of vision in one eye:     Problems with dizziness:         Gastrointestinal    Blood in stool:     Vomited blood:         Genitourinary    Burning when urinating:     Blood in urine:        Psychiatric    Major depression:         Hematologic    Bleeding problems:    Problems with blood clotting too easily:        Skin    Rashes or ulcers:        Constitutional    Fever or chills:      PHYSICAL EXAM  Vitals:   04/18/21 1001  BP: (!) 151/75  Pulse: (!) 56  Resp: 20  Temp: 98.2 F (36.8 C)  SpO2: 95%  Weight: 176 lb (79.8 kg)  Height: 5\' 10"  (1.778 m)    Constitutional: elderly appearing. no distress. Appears well nourished.  Neurologic: CN  intact. no focal findings. no sensory loss. Psychiatric: Mood and affect symmetric and appropriate. Eyes: No icterus. No conjunctival pallor. Ears, nose, throat: mucous membranes moist. Midline trachea.  Cardiac: regular rate and rhythm.  Respiratory: unlabored. Abdominal: soft, non-tender, non-distended.  Peripheral vascular:  2+ R radial pulse  1+ L radial pulse Extremity: No edema. No cyanosis. No pallor.  Skin: No gangrene. No  ulceration.  Lymphatic: No Stemmer's sign. No palpable lymphadenopathy.  PERTINENT LABORATORY AND RADIOLOGIC DATA  Most recent CBC CBC Latest Ref Rng & Units 09/24/2019 05/20/2009 05/18/2009  WBC 4.0 - 10.5 K/uL 6.6 10.9(H) 14.3(H)  Hemoglobin 13.0 - 17.0 g/dL 16.3 12.9 DELTA CHECK NOTED(L) 15.6  Hematocrit 39.0 - 52.0 % 49.7 37.5(L) 46.5  Platelets 150 - 400 K/uL 247 225 DELTA CHECK NOTED 318     Most recent CMP CMP Latest Ref Rng & Units 05/20/2009 05/18/2009 05/17/2009  Glucose 70 - 99 mg/dL 93 144(H) 87  BUN 6 - 23 mg/dL 12 13 22   Creatinine 0.4 - 1.5 mg/dL 1.18 1.29 1.52(H)  Sodium 135 - 145 mEq/L 143 151(H) 139  Potassium 3.5 - 5.1 mEq/L 3.3(L) 2.9(L) 3.5  Chloride 96 - 112 mEq/L 111 121(H) 102  CO2 19 - 32 mEq/L 26 21 26   Calcium 8.4 - 10.5 mg/dL 7.9(L) 8.9 9.6  Total Protein 6.0 - 8.3 g/dL - - 7.9  Total Bilirubin 0.3 - 1.2 mg/dL - - 1.2  Alkaline Phos 39 - 117 U/L - - 94  AST 0 - 37 U/L - - 26  ALT 0 - 53 U/L - - 27    Renal function CrCl cannot be calculated (Patient's most recent lab result is older than the maximum 21 days allowed.).  No results found for: HGBA1C  LDL Cholesterol  Date Value Ref Range Status  05/18/2009  0 - 99 mg/dL Final   71        Total Cholesterol/HDL:CHD Risk Coronary Heart Disease Risk Table                     Men   Women  1/2 Average  Risk   3.4   3.3  Average Risk       5.0   4.4  2 X Average Risk   9.6   7.1  3 X Average Risk  23.4   11.0        Use the calculated Patient Ratio above and the CHD Risk Table to determine the patient's CHD Risk.        ATP III CLASSIFICATION (LDL):  <100     mg/dL   Optimal  100-129  mg/dL   Near or Above                    Optimal  130-159  mg/dL   Borderline  160-189  mg/dL   High  >190     mg/dL   Very High     Vascular Imaging: [facility]21 personally reviewed. No evidence of significant cervical carotid disease bilaterally Known proximal left subclavian  artery occlusion Significant stenosis of the basilar artery Right MCA stenosis Mild vertebral artery origin stenosis bilaterally  Yevonne Aline. Stanford Breed, MD [facility] Phone Number: [phone] 04/18/2021 10:37 AM

## 2021-05-02 IMAGING — MR MR LUMBAR SPINE W/O CM
4 of 5 series · 27 of 48 positions shown · non-contrast
Comparison: Radiographs 06/15/2020

CLINICAL DATA: Left flank pain and right leg pain.

EXAM:
MRI LUMBAR SPINE WITHOUT CONTRAST
TECHNIQUE: Multiplanar, multisequence MR imaging of the lumbar spine was
performed. No intravenous contrast was administered.

[Series 3: T2 · sagittal · 4.0mm · 1.09mm/px · 6 of 17 slices shown (1 of 2)]
[im 1/17]
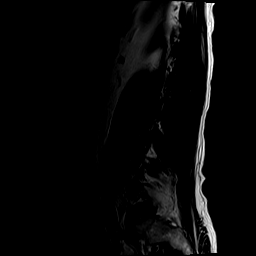
[im 4/17]
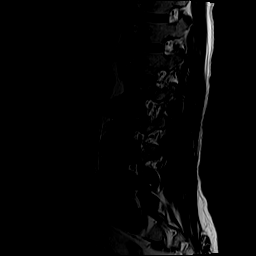
[im 7/17]
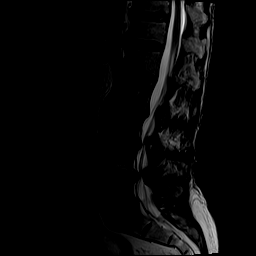
[im 10/17]
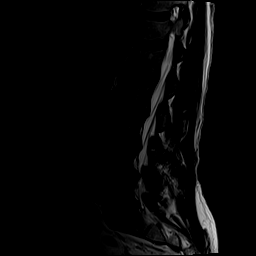
[im 13/17]
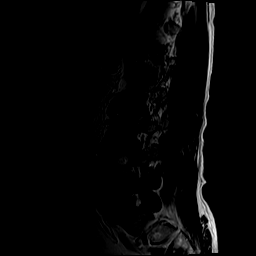
[im 17/17]
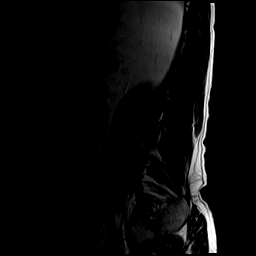

[Series 5: T1 · sagittal · 4.0mm · 1.09mm/px · 6 of 17 slices shown (1 of 2)]
[im 1/17]
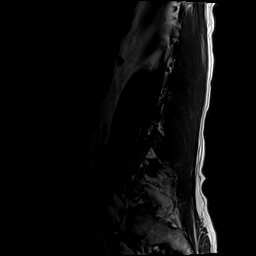
[im 4/17]
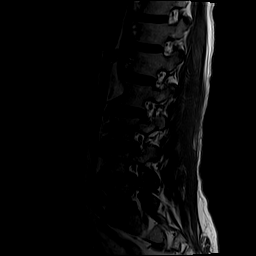
[im 7/17]
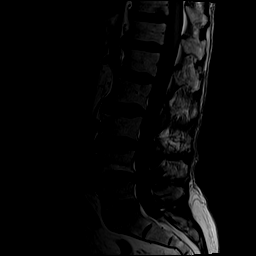
[im 10/17]
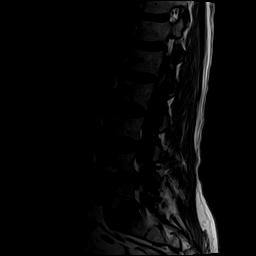
[im 13/17]
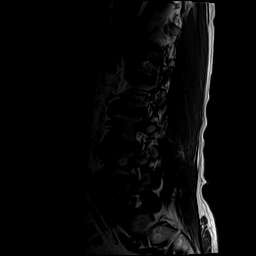
[im 17/17]
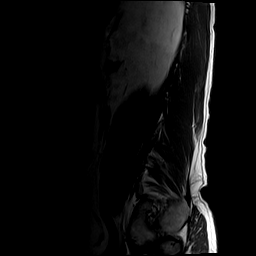

[Series 6: T2 · axial · 4.0mm · 0.39mm/px · z∈[-235,-7]mm · 9 of 46 slices shown (2 of 2)]
[im 1/46]
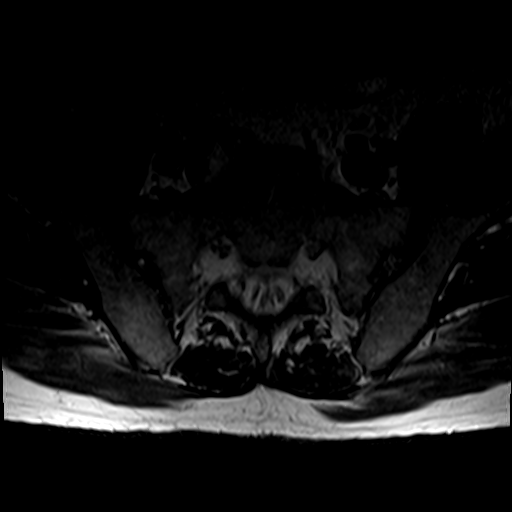
[im 7/46]
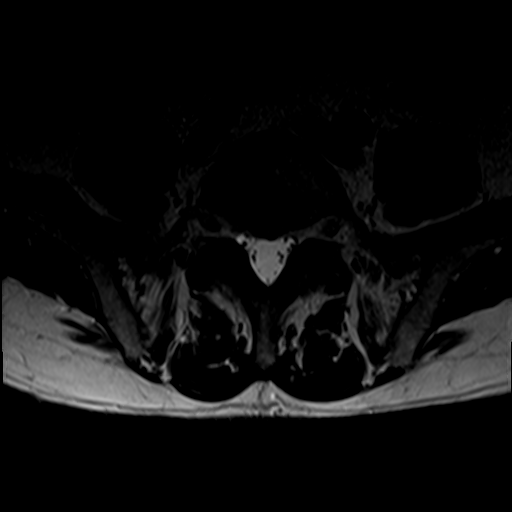
[im 13/46]
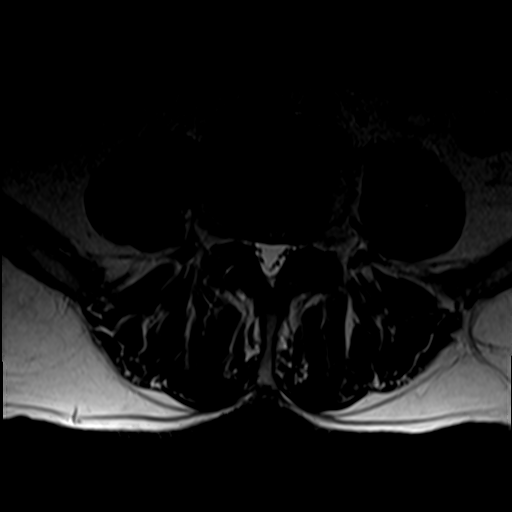
[im 20/46]
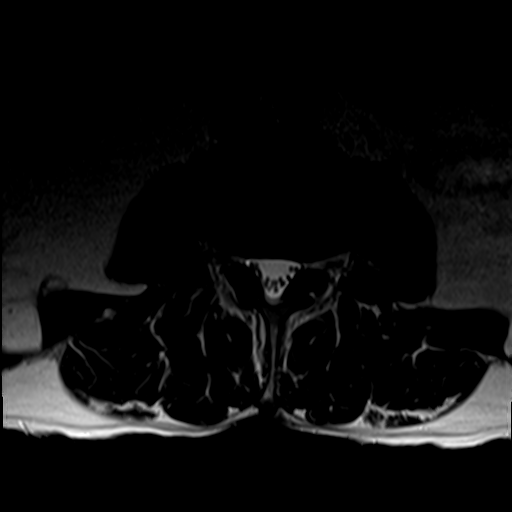
[im 23/46]
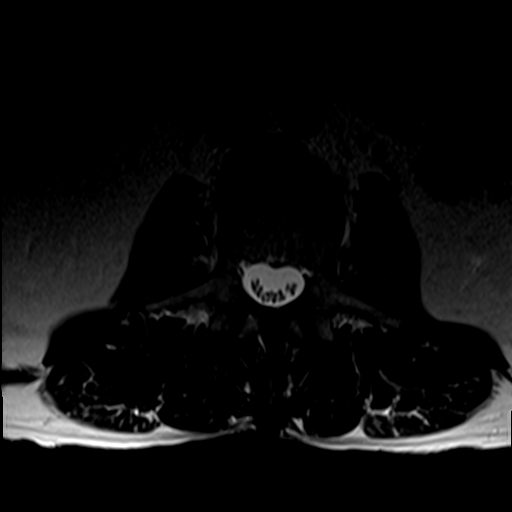
[im 26/46]
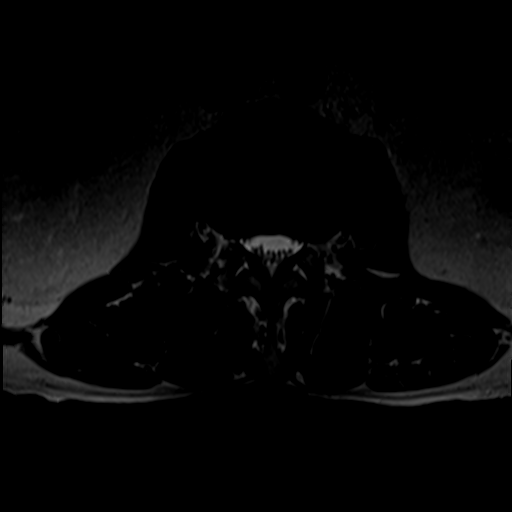
[im 33/46]
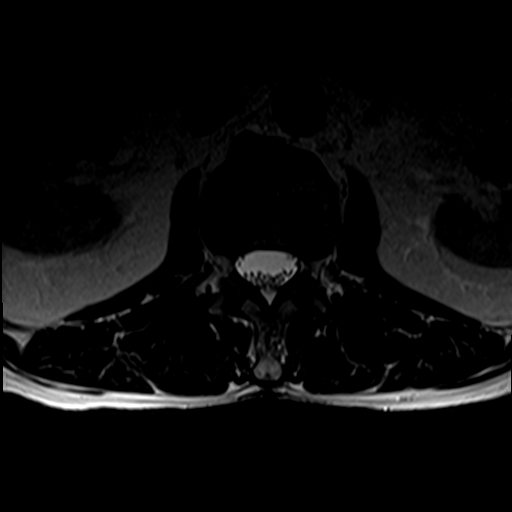
[im 39/46]
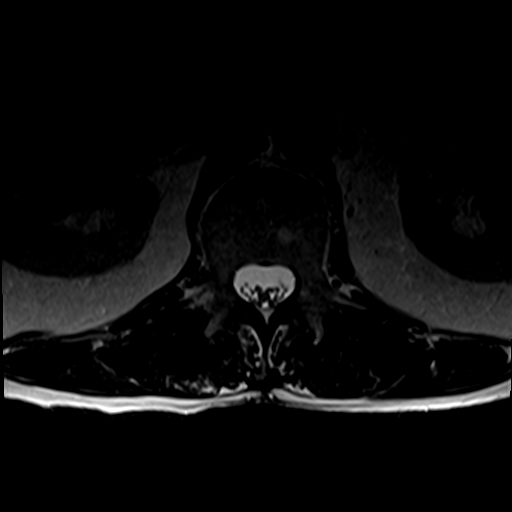
[im 46/46]
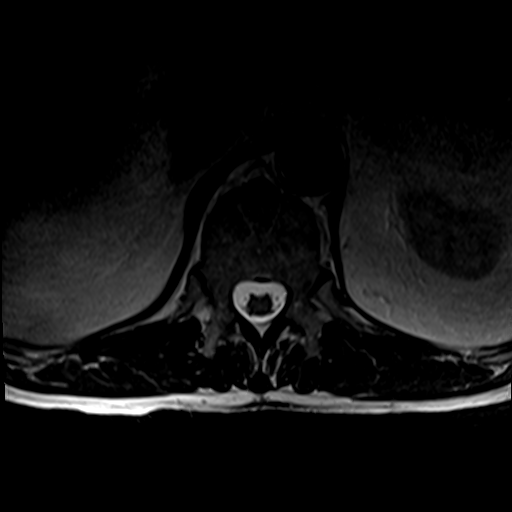

[Series 7: T1 · axial · 4.0mm · 0.39mm/px · z∈[-235,-41]mm · 6 of 46 slices shown (2 of 2)]
[im 1/46]
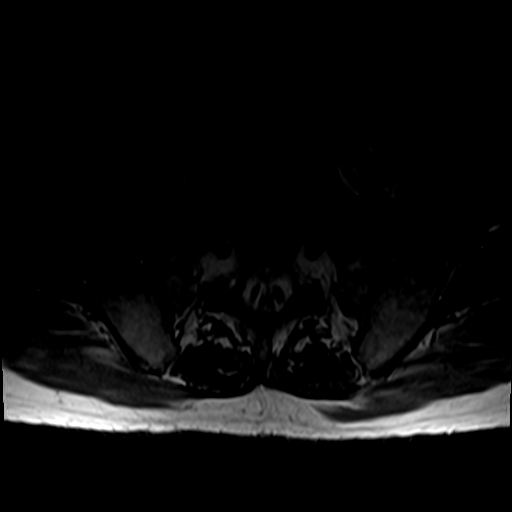
[im 7/46]
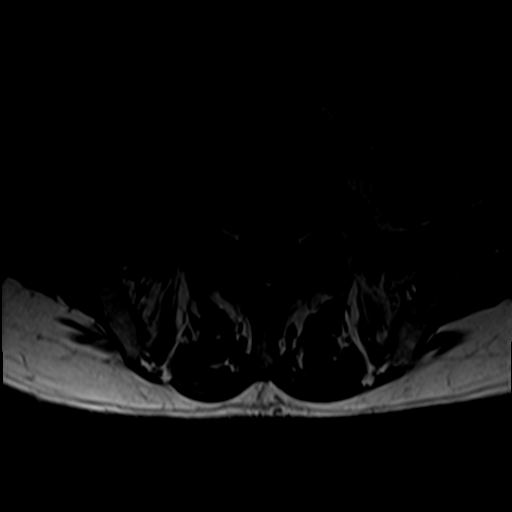
[im 13/46]
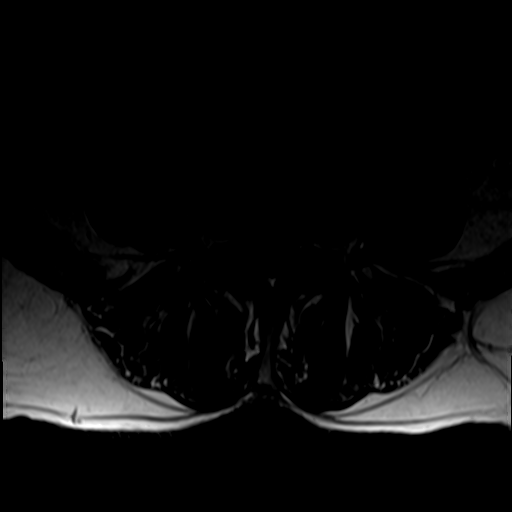
[im 20/46]
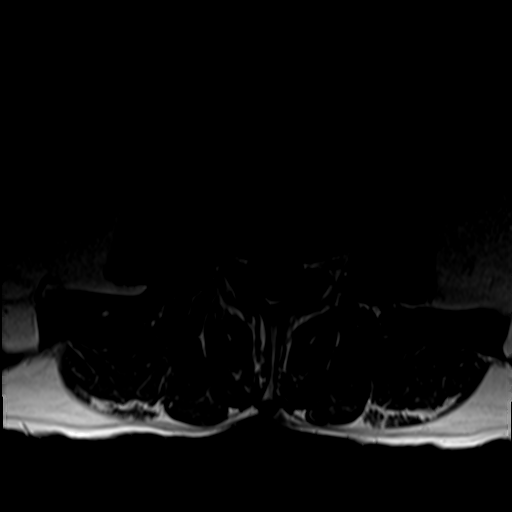
[im 23/46]
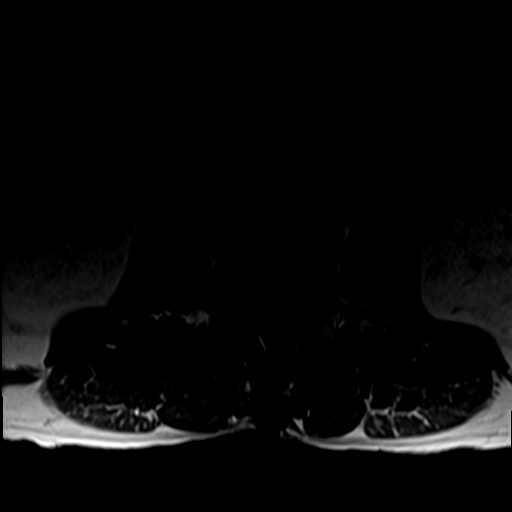
[im 39/46]
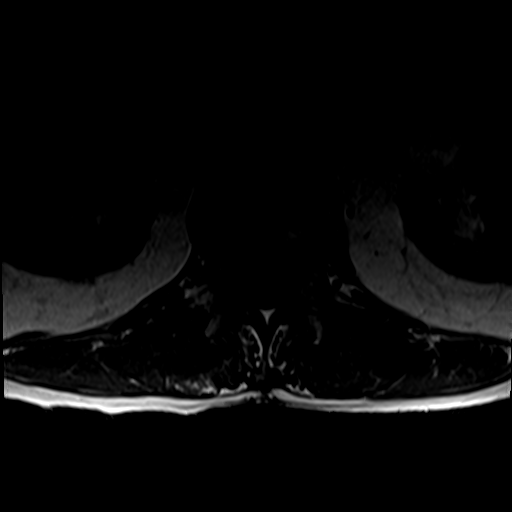

[27 of 48 positions shown; findings below may reference images not displayed]

FINDINGS: Segmentation: The lowest lumbar type non-rib-bearing vertebra is
labeled as L5.

Alignment:  No vertebral subluxation is observed.

Vertebrae: Type 2 degenerative endplate findings along the inferior
endplate of L4. Schmorl's node along the inferior endplate of T11.
Small hemangioma eccentric to the left in the L4 vertebral body.

Conus medullaris and cauda equina: Conus extends to the L1 level.
Conus and cauda equina appear normal.

Paraspinal and other soft tissues: Unremarkable

Disc levels:

L1-2: Unremarkable.

L2-3: No impingement.  Disc bulge with small central annular tear.

L3-4: Mild central narrowing of the thecal sac due to disc bulge,
facet arthropathy, and mild ligamentum flavum redundancy.

L4-5: Mild central narrowing of the thecal sac and mild bilateral
subarticular lateral recess stenosis due to disc bulge,
intervertebral spurring, and facet spurring. The disc bulge abuts
but does not displace the L4 nerves in the lateral extraforaminal
space.

L5-S1: Borderline bilateral foraminal stenosis and borderline left
subarticular lateral recess stenosis due to disc bulge,
intervertebral spurring, and facet arthropathy.
IMPRESSION: 1. Lumbar spondylosis and degenerative disc disease, causing mild
impingement at L3-4 and L4-5.

## 2021-05-03 DIAGNOSIS — H04123 Dry eye syndrome of bilateral lacrimal glands: Secondary | ICD-10-CM | POA: Diagnosis not present

## 2021-05-03 DIAGNOSIS — H401133 Primary open-angle glaucoma, bilateral, severe stage: Secondary | ICD-10-CM | POA: Diagnosis not present

## 2021-05-03 DIAGNOSIS — H47293 Other optic atrophy, bilateral: Secondary | ICD-10-CM | POA: Diagnosis not present

## 2021-05-04 DIAGNOSIS — M5416 Radiculopathy, lumbar region: Secondary | ICD-10-CM | POA: Diagnosis not present

## 2021-05-31 DIAGNOSIS — M25551 Pain in right hip: Secondary | ICD-10-CM | POA: Diagnosis not present

## 2021-05-31 DIAGNOSIS — M5416 Radiculopathy, lumbar region: Secondary | ICD-10-CM | POA: Diagnosis not present

## 2021-05-31 DIAGNOSIS — M5136 Other intervertebral disc degeneration, lumbar region: Secondary | ICD-10-CM | POA: Diagnosis not present

## 2021-06-22 DIAGNOSIS — M25551 Pain in right hip: Secondary | ICD-10-CM | POA: Diagnosis not present

## 2021-07-19 DIAGNOSIS — M5416 Radiculopathy, lumbar region: Secondary | ICD-10-CM | POA: Diagnosis not present

## 2021-07-19 DIAGNOSIS — I10 Essential (primary) hypertension: Secondary | ICD-10-CM | POA: Diagnosis not present

## 2021-07-19 DIAGNOSIS — M25551 Pain in right hip: Secondary | ICD-10-CM | POA: Diagnosis not present

## 2021-07-19 DIAGNOSIS — M5136 Other intervertebral disc degeneration, lumbar region: Secondary | ICD-10-CM | POA: Diagnosis not present

## 2021-07-19 DIAGNOSIS — Z6826 Body mass index (BMI) 26.0-26.9, adult: Secondary | ICD-10-CM | POA: Diagnosis not present

## 2021-08-02 DIAGNOSIS — E785 Hyperlipidemia, unspecified: Secondary | ICD-10-CM | POA: Diagnosis present

## 2021-08-02 DIAGNOSIS — I440 Atrioventricular block, first degree: Secondary | ICD-10-CM | POA: Diagnosis present

## 2021-08-02 DIAGNOSIS — G8929 Other chronic pain: Secondary | ICD-10-CM | POA: Diagnosis present

## 2021-08-02 DIAGNOSIS — E039 Hypothyroidism, unspecified: Secondary | ICD-10-CM | POA: Diagnosis present

## 2021-08-02 DIAGNOSIS — M549 Dorsalgia, unspecified: Secondary | ICD-10-CM | POA: Diagnosis present

## 2021-08-02 DIAGNOSIS — I10 Essential (primary) hypertension: Secondary | ICD-10-CM | POA: Diagnosis present

## 2021-08-02 DIAGNOSIS — W1839XA Other fall on same level, initial encounter: Secondary | ICD-10-CM | POA: Diagnosis not present

## 2021-08-02 DIAGNOSIS — Z86018 Personal history of other benign neoplasm: Secondary | ICD-10-CM | POA: Diagnosis not present

## 2021-08-02 DIAGNOSIS — I6522 Occlusion and stenosis of left carotid artery: Secondary | ICD-10-CM | POA: Diagnosis present

## 2021-08-02 DIAGNOSIS — Z8673 Personal history of transient ischemic attack (TIA), and cerebral infarction without residual deficits: Secondary | ICD-10-CM | POA: Diagnosis not present

## 2021-08-02 DIAGNOSIS — I6523 Occlusion and stenosis of bilateral carotid arteries: Secondary | ICD-10-CM | POA: Diagnosis not present

## 2021-08-02 DIAGNOSIS — E893 Postprocedural hypopituitarism: Secondary | ICD-10-CM | POA: Diagnosis not present

## 2021-08-02 DIAGNOSIS — Z7982 Long term (current) use of aspirin: Secondary | ICD-10-CM | POA: Diagnosis not present

## 2021-08-02 DIAGNOSIS — Z7901 Long term (current) use of anticoagulants: Secondary | ICD-10-CM | POA: Diagnosis not present

## 2021-08-02 DIAGNOSIS — R001 Bradycardia, unspecified: Secondary | ICD-10-CM | POA: Diagnosis present

## 2021-08-02 DIAGNOSIS — Z8546 Personal history of malignant neoplasm of prostate: Secondary | ICD-10-CM | POA: Diagnosis not present

## 2021-08-02 DIAGNOSIS — T38895A Adverse effect of other hormones and synthetic substitutes, initial encounter: Secondary | ICD-10-CM | POA: Diagnosis present

## 2021-08-02 DIAGNOSIS — Z9079 Acquired absence of other genital organ(s): Secondary | ICD-10-CM | POA: Diagnosis not present

## 2021-08-02 DIAGNOSIS — I213 ST elevation (STEMI) myocardial infarction of unspecified site: Secondary | ICD-10-CM | POA: Diagnosis not present

## 2021-08-02 DIAGNOSIS — R55 Syncope and collapse: Secondary | ICD-10-CM | POA: Diagnosis not present

## 2021-08-02 DIAGNOSIS — I499 Cardiac arrhythmia, unspecified: Secondary | ICD-10-CM | POA: Diagnosis not present

## 2021-08-02 DIAGNOSIS — Z79899 Other long term (current) drug therapy: Secondary | ICD-10-CM | POA: Diagnosis not present

## 2021-08-02 DIAGNOSIS — Z7902 Long term (current) use of antithrombotics/antiplatelets: Secondary | ICD-10-CM | POA: Diagnosis not present

## 2021-08-02 DIAGNOSIS — E871 Hypo-osmolality and hyponatremia: Secondary | ICD-10-CM | POA: Diagnosis not present

## 2021-08-02 DIAGNOSIS — R404 Transient alteration of awareness: Secondary | ICD-10-CM | POA: Diagnosis not present

## 2021-08-02 DIAGNOSIS — I708 Atherosclerosis of other arteries: Secondary | ICD-10-CM | POA: Diagnosis present

## 2021-08-02 DIAGNOSIS — Y999 Unspecified external cause status: Secondary | ICD-10-CM | POA: Diagnosis not present

## 2021-08-02 DIAGNOSIS — I2510 Atherosclerotic heart disease of native coronary artery without angina pectoris: Secondary | ICD-10-CM | POA: Diagnosis present

## 2021-08-10 DIAGNOSIS — E871 Hypo-osmolality and hyponatremia: Secondary | ICD-10-CM | POA: Diagnosis not present

## 2021-08-10 DIAGNOSIS — R252 Cramp and spasm: Secondary | ICD-10-CM | POA: Diagnosis not present

## 2021-08-10 DIAGNOSIS — R946 Abnormal results of thyroid function studies: Secondary | ICD-10-CM | POA: Diagnosis not present

## 2021-08-10 DIAGNOSIS — E230 Hypopituitarism: Secondary | ICD-10-CM | POA: Diagnosis not present

## 2021-08-10 DIAGNOSIS — M545 Low back pain, unspecified: Secondary | ICD-10-CM | POA: Diagnosis not present

## 2021-09-04 DIAGNOSIS — H04123 Dry eye syndrome of bilateral lacrimal glands: Secondary | ICD-10-CM | POA: Diagnosis not present

## 2021-09-04 DIAGNOSIS — H47293 Other optic atrophy, bilateral: Secondary | ICD-10-CM | POA: Diagnosis not present

## 2021-09-04 DIAGNOSIS — H401133 Primary open-angle glaucoma, bilateral, severe stage: Secondary | ICD-10-CM | POA: Diagnosis not present

## 2021-09-11 DIAGNOSIS — M25551 Pain in right hip: Secondary | ICD-10-CM | POA: Diagnosis not present

## 2021-09-11 DIAGNOSIS — Z6825 Body mass index (BMI) 25.0-25.9, adult: Secondary | ICD-10-CM | POA: Diagnosis not present

## 2021-09-11 DIAGNOSIS — I10 Essential (primary) hypertension: Secondary | ICD-10-CM | POA: Diagnosis not present

## 2021-09-11 DIAGNOSIS — M5416 Radiculopathy, lumbar region: Secondary | ICD-10-CM | POA: Diagnosis not present

## 2021-09-25 DIAGNOSIS — R252 Cramp and spasm: Secondary | ICD-10-CM | POA: Diagnosis not present

## 2021-09-25 DIAGNOSIS — M545 Low back pain, unspecified: Secondary | ICD-10-CM | POA: Diagnosis not present

## 2021-09-25 DIAGNOSIS — Z0000 Encounter for general adult medical examination without abnormal findings: Secondary | ICD-10-CM | POA: Diagnosis not present

## 2021-09-25 DIAGNOSIS — E559 Vitamin D deficiency, unspecified: Secondary | ICD-10-CM | POA: Diagnosis not present

## 2021-09-25 DIAGNOSIS — Z23 Encounter for immunization: Secondary | ICD-10-CM | POA: Diagnosis not present

## 2021-09-25 DIAGNOSIS — Z79899 Other long term (current) drug therapy: Secondary | ICD-10-CM | POA: Diagnosis not present

## 2021-09-25 DIAGNOSIS — Z0001 Encounter for general adult medical examination with abnormal findings: Secondary | ICD-10-CM | POA: Diagnosis not present

## 2021-09-25 DIAGNOSIS — E871 Hypo-osmolality and hyponatremia: Secondary | ICD-10-CM | POA: Diagnosis not present

## 2021-09-25 DIAGNOSIS — H9190 Unspecified hearing loss, unspecified ear: Secondary | ICD-10-CM | POA: Diagnosis not present

## 2021-09-25 DIAGNOSIS — I358 Other nonrheumatic aortic valve disorders: Secondary | ICD-10-CM | POA: Diagnosis not present

## 2021-09-25 DIAGNOSIS — E230 Hypopituitarism: Secondary | ICD-10-CM | POA: Diagnosis not present

## 2021-09-25 DIAGNOSIS — I771 Stricture of artery: Secondary | ICD-10-CM | POA: Diagnosis not present

## 2021-10-20 ENCOUNTER — Ambulatory Visit (HOSPITAL_COMMUNITY)
Admission: EM | Admit: 2021-10-20 | Discharge: 2021-10-20 | Disposition: A | Discharge status: Home / Self Care | Payer: Medicare Other | Attending: Emergency Medicine | Admitting: Emergency Medicine

## 2021-10-20 ENCOUNTER — Encounter (HOSPITAL_COMMUNITY): Payer: Self-pay | Admitting: *Deleted

## 2021-10-20 ENCOUNTER — Other Ambulatory Visit: Payer: Self-pay

## 2021-10-20 DIAGNOSIS — R0989 Other specified symptoms and signs involving the circulatory and respiratory systems: Secondary | ICD-10-CM

## 2021-10-20 DIAGNOSIS — R509 Fever, unspecified: Secondary | ICD-10-CM | POA: Diagnosis not present

## 2021-10-20 DIAGNOSIS — Z20822 Contact with and (suspected) exposure to covid-19: Secondary | ICD-10-CM | POA: Insufficient documentation

## 2021-10-20 DIAGNOSIS — J029 Acute pharyngitis, unspecified: Secondary | ICD-10-CM | POA: Diagnosis not present

## 2021-10-20 DIAGNOSIS — R0982 Postnasal drip: Secondary | ICD-10-CM

## 2021-10-20 DIAGNOSIS — R0981 Nasal congestion: Secondary | ICD-10-CM | POA: Diagnosis not present

## 2021-10-20 DIAGNOSIS — R059 Cough, unspecified: Secondary | ICD-10-CM | POA: Insufficient documentation

## 2021-10-20 DIAGNOSIS — R112 Nausea with vomiting, unspecified: Secondary | ICD-10-CM | POA: Insufficient documentation

## 2021-10-20 LAB — RESPIRATORY PANEL BY PCR

## 2021-10-20 LAB — POCT RAPID STREP A, ED / UC: Streptococcus, Group A Screen (Direct): NEGATIVE

## 2021-10-20 MED ORDER — OSELTAMIVIR PHOSPHATE 75 MG PO CAPS: 75.0000 mg | ORAL_CAPSULE | Freq: Two times a day (BID) | ORAL | 0 refills | Status: DC

## 2021-10-20 MED ORDER — ONDANSETRON HCL 4 MG PO TABS: 4.0000 mg | ORAL_TABLET | Freq: Three times a day (TID) | ORAL | 0 refills | Status: DC | PRN

## 2021-10-20 NOTE — Discharge Instructions (Signed)
Use the nausea medicine if needed to control nausea and vomiting.  Make sure you are able to drink plenty of fluids and stay hydrated.  If you are not able to drink enough fluids to stay hydrated, he will need to seek care in the emergency room.  You can use Mucinex to help your nasal congestion.  Saline nasal spray can also be very helpful for nasal congestion.  Use Tylenol or ibuprofen if needed for body aches and pain.  You might try salt water gargles to help your sore throat.

## 2021-10-20 NOTE — ED Provider Notes (Signed)
[place]    CSN: [id number] Arrival date & time: 10/20/21  1156      History   Chief Complaint Chief Complaint  Patient presents with   Nausea   Sore Throat   Cough    HPI Alexander Yoder is a 87 y.o. male.  He reports 2 days of postnasal drainage, nasal congestion, cough, sore throat, nausea, and emesis x1 this morning.  Denies fever.  Reports he has had chills.  Has been using ibuprofen, last took ibuprofen about 3 hours ago.  Did get a flu shot.  Denies body aches, abdominal pain.    Sore Throat Pertinent negatives include no abdominal pain and no shortness of breath.  Cough Associated symptoms: chills, rhinorrhea and sore throat   Associated symptoms: no fever, no myalgias, no shortness of breath and no wheezing    Past Medical History:  Diagnosis Date   Amblyopia of eye, right    Anxiety attack    Cancer ([place])    prostate   Exotropia of right eye    Glaucoma    Hyperlipidemia    Hypothyroidism    Pituitary tumor     There are no problems to display for this patient.   Past Surgical History:  Procedure Laterality Date   COLONOSCOPY     EYE SURGERY Bilateral    cataracts removed   PROSTATECTOMY  2000   STRABISMUS SURGERY Right 09/24/2019   Procedure: REPAIR STRABISMUS RIGHT EYE;  Surgeon: Lamonte Sakai, MD;  Location: [facility];  Service: Ophthalmology;  Laterality: Right;   TONSILLECTOMY     TRANSPHENOIDAL / TRANSNASAL HYPOPHYSECTOMY / RESECTION PITUITARY TUMOR  1998, 2010   x2 -   WISDOM TOOTH EXTRACTION         Home Medications    Prior to Admission medications   Medication Sig Start Date End Date Taking? Authorizing Provider  ondansetron (ZOFRAN) 4 MG tablet Take 1 tablet (4 mg total) by mouth every 8 (eight) hours as needed for nausea or vomiting. 10/20/21  Yes Carvel Getting, NP  oseltamivir (TAMIFLU) 75 MG capsule Take 1 capsule (75 mg total) by mouth every 12 (twelve) hours. 10/20/21  Yes Carvel Getting, NP  acetaminophen  (TYLENOL) 500 MG tablet Take 500 mg by mouth every 6 (six) hours as needed.    [provider]  aspirin EC 81 MG tablet Take 81 mg by mouth daily.    [provider]  B Complex-C (B-COMPLEX WITH VITAMIN C) tablet Take 1 tablet by mouth daily.    [provider]  brimonidine (ALPHAGAN) 0.15 % ophthalmic solution Place 1 drop into both eyes 2 (two) times daily.     [provider]  Cholecalciferol (VITAMIN D) 50 MCG (2000 UT) CAPS Take 2,000 Units by mouth daily.    [provider]  clopidogrel (PLAVIX) 75 MG tablet Take 1 tablet by mouth daily. 03/29/21   [provider]  desmopressin (DDAVP) 0.2 MG tablet Take 0.2-0.4 mg by mouth See admin instructions. 2 tabs in the morning, and 1 tab in the evening    [provider]  hydrocortisone (CORTEF) 10 MG tablet Take 5-10 mg by mouth See admin instructions. 10 mg in the morning, 5 mg in the evening    [provider]  latanoprost (XALATAN) 0.005 % ophthalmic solution Place 1 drop into both eyes at bedtime.    [provider]  neomycin-polymyxin-dexameth (MAXITROL) 0.1 % OINT Place 1 application into the right eye 4 (four)  times daily. For 1 week 09/24/19   Lamonte Sakai, MD  Omega-3 Fatty Acids (FISH OIL) 1000 MG CAPS Take 1,000 mg by mouth daily.    [provider]  rosuvastatin (CRESTOR) 20 MG tablet Take 5 mg by mouth every Monday, Wednesday, and Friday.     [provider]  SYNTHROID 112 MCG tablet Take 112 mcg by mouth daily before breakfast.  07/29/19   [provider]  testosterone cypionate (DEPOTESTOSTERONE CYPIONATE) 200 MG/ML injection SMARTSIG:0.8 Milliliter(s) IM Every 2 Weeks 10/31/20   [provider]    Family History History reviewed. No pertinent family history.  Social History Social History   Tobacco Use   Smoking status: Never   Smokeless tobacco: Never  Vaping Use   Vaping Use: Never used  Substance Use Topics    Alcohol use: No   Drug use: Never     Allergies   Peanut-containing drug products and Penicillins   Review of Systems Review of Systems  Constitutional:  Positive for chills. Negative for fever.  HENT:  Positive for congestion, postnasal drip, rhinorrhea and sore throat.   Respiratory:  Positive for cough. Negative for shortness of breath and wheezing.   Gastrointestinal:  Positive for nausea and vomiting. Negative for abdominal pain.  Musculoskeletal:  Negative for myalgias.    Physical Exam Triage Vital Signs ED Triage Vitals  Enc Vitals Group     BP 10/20/21 1403 (!) 102/53     Pulse Rate 10/20/21 1403 82     Resp 10/20/21 1403 18     Temp 10/20/21 1403 99 F (37.2 C)     Temp src --      SpO2 10/20/21 1403 97 %     Weight --      Height --      Head Circumference --      Peak Flow --      Pain Score 10/20/21 1400 5     Pain Loc --      Pain Edu? --      Excl. in [place]? --    No data found.  Updated Vital Signs BP (!) 102/53   Pulse 82   Temp 99 F (37.2 C)   Resp 18   SpO2 97%   Visual Acuity Right Eye Distance:   Left Eye Distance:   Bilateral Distance:    Right Eye Near:   Left Eye Near:    Bilateral Near:     Physical Exam Constitutional:      General: He is not in acute distress.    Appearance: He is well-developed. He is ill-appearing.  HENT:     Right Ear: Ear canal and external ear normal. 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     Left Ear: Tympanic membrane, ear canal and external ear normal.     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    Nose: Congestion present.     Mouth/Throat:     Pharynx: Pharyngeal swelling and posterior oropharyngeal erythema present. No oropharyngeal exudate.     Comments: Tonsils absent Cardiovascular:     Rate and Rhythm: Normal rate and regular rhythm.  Pulmonary:     Effort: Pulmonary effort is normal.     Breath sounds: Normal breath sounds.  Abdominal:     General: Abdomen is flat. Bowel  sounds are normal.     Tenderness: There is no abdominal tenderness. There is no guarding or rebound.  Neurological:     Mental  Status: He is alert.     UC Treatments / Results  Labs (all labs ordered are listed, but only abnormal results are displayed) Labs Reviewed  RESPIRATORY PANEL BY PCR  POCT RAPID STREP A, ED / UC    EKG   Radiology No results found.  Procedures Procedures (including critical care time)  Medications Ordered in UC Medications - No data to display  Initial Impression / Assessment and Plan / UC Course  I have reviewed the triage vital signs and the nursing notes.  Pertinent labs & imaging results that were available during my care of the patient were reviewed by me and considered in my medical decision making (see chart for details).    Rapid strep negative. Respiratory virus panel pending.  Patient developed symptoms approximately 2 days ago and is nearing the window for Tamiflu to be the most helpful.  We discussed patient empirically taking Tamiflu in case this is influenza and he agrees to take it.  We will also test for COVID.   Final Clinical Impressions(s) / UC Diagnoses   Final diagnoses:  Suspected novel influenza A virus infection     Discharge Instructions      Use the nausea medicine if needed to control nausea and vomiting.  Make sure you are able to drink plenty of fluids and stay hydrated.  If you are not able to drink enough fluids to stay hydrated, he will need to seek care in the emergency room.  You can use Mucinex to help your nasal congestion.  Saline nasal spray can also be very helpful for nasal congestion.  Use Tylenol or ibuprofen if needed for body aches and pain.  You might try salt water gargles to help your sore throat.     ED Prescriptions     Medication Sig Dispense Auth. Provider   ondansetron (ZOFRAN) 4 MG tablet Take 1 tablet (4 mg total) by mouth every 8 (eight) hours as needed for nausea or vomiting. 20  tablet Carvel Getting, NP   oseltamivir (TAMIFLU) 75 MG capsule Take 1 capsule (75 mg total) by mouth every 12 (twelve) hours. 10 capsule Carvel Getting, NP      PDMP not reviewed this encounter.   Carvel Getting, NP 10/20/21 [phone]

## 2021-10-20 NOTE — ED Triage Notes (Signed)
Pt reports 3 days of cough ,sore throat and nausea.

## 2021-11-28 DIAGNOSIS — E230 Hypopituitarism: Secondary | ICD-10-CM | POA: Diagnosis not present

## 2021-11-28 DIAGNOSIS — E232 Diabetes insipidus: Secondary | ICD-10-CM | POA: Diagnosis not present

## 2021-11-28 DIAGNOSIS — Z8546 Personal history of malignant neoplasm of prostate: Secondary | ICD-10-CM | POA: Diagnosis not present

## 2021-11-28 DIAGNOSIS — D497 Neoplasm of unspecified behavior of endocrine glands and other parts of nervous system: Secondary | ICD-10-CM | POA: Diagnosis not present

## 2021-12-05 DIAGNOSIS — L821 Other seborrheic keratosis: Secondary | ICD-10-CM | POA: Diagnosis not present

## 2021-12-05 DIAGNOSIS — L570 Actinic keratosis: Secondary | ICD-10-CM | POA: Diagnosis not present

## 2021-12-05 DIAGNOSIS — D225 Melanocytic nevi of trunk: Secondary | ICD-10-CM | POA: Diagnosis not present

## 2021-12-05 DIAGNOSIS — D485 Neoplasm of uncertain behavior of skin: Secondary | ICD-10-CM | POA: Diagnosis not present

## 2021-12-25 ENCOUNTER — Emergency Department (HOSPITAL_COMMUNITY)
Admission: EM | Admit: 2021-12-25 | Discharge: 2021-12-25 | Disposition: A | Discharge status: Home / Self Care | Payer: Medicare Other | Attending: Emergency Medicine | Admitting: Emergency Medicine

## 2021-12-25 DIAGNOSIS — Z79899 Other long term (current) drug therapy: Secondary | ICD-10-CM | POA: Diagnosis not present

## 2021-12-25 DIAGNOSIS — Z7982 Long term (current) use of aspirin: Secondary | ICD-10-CM | POA: Diagnosis not present

## 2021-12-25 DIAGNOSIS — X58XXXA Exposure to other specified factors, initial encounter: Secondary | ICD-10-CM | POA: Diagnosis not present

## 2021-12-25 DIAGNOSIS — T161XXA Foreign body in right ear, initial encounter: Secondary | ICD-10-CM | POA: Diagnosis not present

## 2021-12-25 NOTE — ED Provider Notes (Signed)
[facility] EMERGENCY DEPARTMENT Provider Note   CSN: [id number] Arrival date & time: 12/25/21  2205     History  Chief Complaint  Patient presents with   Foreign Body in [place] is a 88 y.o. male.   Foreign Body in [place]   88 y.o. M here with FB in right ear.  Was trying to remove hearing aid when plastic piece came off and is stuck in right ear.  Tried to remove it himself with tweezers but ended up pushing it further inside the ear.    Home Medications Prior to Admission medications   Medication Sig Start Date End Date Taking? Authorizing Provider  acetaminophen (TYLENOL) 500 MG tablet Take 500 mg by mouth every 6 (six) hours as needed.    [provider]  aspirin EC 81 MG tablet Take 81 mg by mouth daily.    [provider]  B Complex-C (B-COMPLEX WITH VITAMIN C) tablet Take 1 tablet by mouth daily.    [provider]  brimonidine (ALPHAGAN) 0.15 % ophthalmic solution Place 1 drop into both eyes 2 (two) times daily.     [provider]  Cholecalciferol (VITAMIN D) 50 MCG (2000 UT) CAPS Take 2,000 Units by mouth daily.    [provider]  clopidogrel (PLAVIX) 75 MG tablet Take 1 tablet by mouth daily. 03/29/21   [provider]  desmopressin (DDAVP) 0.2 MG tablet Take 0.2-0.4 mg by mouth See admin instructions. 2 tabs in the morning, and 1 tab in the evening    [provider]  hydrocortisone (CORTEF) 10 MG tablet Take 5-10 mg by mouth See admin instructions. 10 mg in the morning, 5 mg in the evening    [provider]  latanoprost (XALATAN) 0.005 % ophthalmic solution Place 1 drop into both eyes at bedtime.    [provider]  neomycin-polymyxin-dexameth (MAXITROL) 0.1 % OINT Place 1 application into the right eye 4 (four) times daily. For 1 week 09/24/19   Lamonte Sakai, MD  Omega-3 Fatty Acids (FISH OIL) 1000 MG CAPS Take 1,000 mg by mouth daily.    [provider]  ondansetron (ZOFRAN) 4 MG tablet Take 1 tablet (4 mg total) by mouth every 8 (eight) hours as needed for nausea or vomiting. 10/20/21   Carvel Getting, NP  oseltamivir (TAMIFLU) 75 MG capsule Take 1 capsule (75 mg total) by mouth every 12 (twelve) hours. 10/20/21   Carvel Getting, NP  rosuvastatin (CRESTOR) 20 MG tablet Take 5 mg by mouth every Monday, Wednesday, and Friday.     [provider]  SYNTHROID 112 MCG tablet Take 112 mcg by mouth daily before breakfast.  07/29/19   [provider]  testosterone cypionate (DEPOTESTOSTERONE CYPIONATE) 200 MG/ML injection SMARTSIG:0.8 Milliliter(s) IM Every 2 Weeks 10/31/20   [provider]      Allergies    Peanut-containing drug products and Penicillins    Review of Systems   Review of Systems  HENT:         FB right ear  All other systems reviewed and are negative.  Physical Exam Updated Vital Signs BP (!) 175/83    Pulse 70    Temp 97.6 F (36.4 C) (Oral)    Resp 16    SpO2 99%   Physical Exam Vitals and nursing note reviewed.  Constitutional:      Appearance: He is well-developed.  HENT:     Head: Normocephalic  and atraumatic.     Ears:     Comments: Plastic hearing aid piece visible in ear canal Eyes:     Conjunctiva/sclera: Conjunctivae normal.     Pupils: Pupils are equal, round, and reactive to light.  Cardiovascular:     Rate and Rhythm: Normal rate and regular rhythm.     Heart sounds: Normal heart sounds.  Pulmonary:     Effort: Pulmonary effort is normal.     Breath sounds: Normal breath sounds.  Abdominal:     General: Bowel sounds are normal.     Palpations: Abdomen is soft.  Musculoskeletal:        General: Normal range of motion.     Cervical back: Normal range of motion.  Skin:    General: Skin is warm and dry.  Neurological:     Mental Status: He is alert and oriented to person, place, and time.    ED Results / Procedures / Treatments   Labs (all labs ordered  are listed, but only abnormal results are displayed) Labs Reviewed - No data to display  EKG None  Radiology No results found.  Procedures .Foreign Body Removal  Date/Time: 12/25/2021 11:57 PM Performed by: Larene Pickett, PA-C Authorized by: Larene Pickett, PA-C  Consent: Verbal consent obtained. Risks and benefits: risks, benefits and alternatives were discussed Required items: required blood products, implants, devices, and special equipment available Patient identity confirmed: verbally with patient Time out: Immediately prior to procedure a "time out" was called to verify the correct patient, procedure, equipment, support staff and site/side marked as required. Body area: ear Location details: right ear  Sedation: Patient sedated: no  Patient restrained: no Localization method: visualized Removal mechanism: alligator forceps Complexity: simple 1 objects recovered. Objects recovered: hearing aid piece Post-procedure assessment: foreign body removed Patient tolerance: patient tolerated the procedure well with no immediate complications     Medications Ordered in ED Medications - No data to display  ED Course/ Medical Decision Making/ A&P                           Medical Decision Making  88 y.o. M here with FB in right ear.  Hearing aid piece easily removed on exam, tolerated well without complications.  Stable for discharge.  Final Clinical Impression(s) / ED Diagnoses Final diagnoses:  Foreign body of right ear, initial encounter    Rx / DC Orders ED Discharge Orders     None         Larene Pickett, PA-C 12/25/21 2358    Elnora Morrison, MD 12/28/21 [phone]

## 2021-12-25 NOTE — ED Triage Notes (Signed)
Pt c/o tip of hearing aid in ear. Hearing intact, no additional complaints

## 2022-01-09 DIAGNOSIS — M9904 Segmental and somatic dysfunction of sacral region: Secondary | ICD-10-CM | POA: Diagnosis not present

## 2022-01-09 DIAGNOSIS — M461 Sacroiliitis, not elsewhere classified: Secondary | ICD-10-CM | POA: Diagnosis not present

## 2022-01-09 DIAGNOSIS — M9905 Segmental and somatic dysfunction of pelvic region: Secondary | ICD-10-CM | POA: Diagnosis not present

## 2022-01-09 DIAGNOSIS — H903 Sensorineural hearing loss, bilateral: Secondary | ICD-10-CM | POA: Diagnosis not present

## 2022-01-09 DIAGNOSIS — M9903 Segmental and somatic dysfunction of lumbar region: Secondary | ICD-10-CM | POA: Diagnosis not present

## 2022-01-10 DIAGNOSIS — H0102A Squamous blepharitis right eye, upper and lower eyelids: Secondary | ICD-10-CM | POA: Diagnosis not present

## 2022-01-10 DIAGNOSIS — H524 Presbyopia: Secondary | ICD-10-CM | POA: Diagnosis not present

## 2022-01-10 DIAGNOSIS — H47293 Other optic atrophy, bilateral: Secondary | ICD-10-CM | POA: Diagnosis not present

## 2022-01-10 DIAGNOSIS — H04123 Dry eye syndrome of bilateral lacrimal glands: Secondary | ICD-10-CM | POA: Diagnosis not present

## 2022-01-10 DIAGNOSIS — H401133 Primary open-angle glaucoma, bilateral, severe stage: Secondary | ICD-10-CM | POA: Diagnosis not present

## 2022-01-11 ENCOUNTER — Encounter (INDEPENDENT_AMBULATORY_CARE_PROVIDER_SITE_OTHER): Payer: Self-pay | Admitting: Podiatry

## 2022-01-11 DIAGNOSIS — M9903 Segmental and somatic dysfunction of lumbar region: Secondary | ICD-10-CM | POA: Diagnosis not present

## 2022-01-11 DIAGNOSIS — M9905 Segmental and somatic dysfunction of pelvic region: Secondary | ICD-10-CM | POA: Diagnosis not present

## 2022-01-11 DIAGNOSIS — M461 Sacroiliitis, not elsewhere classified: Secondary | ICD-10-CM | POA: Diagnosis not present

## 2022-01-11 DIAGNOSIS — M9904 Segmental and somatic dysfunction of sacral region: Secondary | ICD-10-CM | POA: Diagnosis not present

## 2022-01-11 NOTE — Progress Notes (Signed)
NO SHOW. This encounter was created in error - please disregard.

## 2022-01-12 DIAGNOSIS — M9905 Segmental and somatic dysfunction of pelvic region: Secondary | ICD-10-CM | POA: Diagnosis not present

## 2022-01-12 DIAGNOSIS — M9903 Segmental and somatic dysfunction of lumbar region: Secondary | ICD-10-CM | POA: Diagnosis not present

## 2022-01-12 DIAGNOSIS — M461 Sacroiliitis, not elsewhere classified: Secondary | ICD-10-CM | POA: Diagnosis not present

## 2022-01-12 DIAGNOSIS — M9904 Segmental and somatic dysfunction of sacral region: Secondary | ICD-10-CM | POA: Diagnosis not present

## 2022-01-16 ENCOUNTER — Ambulatory Visit (INDEPENDENT_AMBULATORY_CARE_PROVIDER_SITE_OTHER): Payer: Medicare Other | Admitting: Podiatry

## 2022-01-16 ENCOUNTER — Other Ambulatory Visit: Payer: Self-pay

## 2022-01-16 DIAGNOSIS — L600 Ingrowing nail: Secondary | ICD-10-CM | POA: Diagnosis not present

## 2022-01-16 DIAGNOSIS — M9904 Segmental and somatic dysfunction of sacral region: Secondary | ICD-10-CM | POA: Diagnosis not present

## 2022-01-16 DIAGNOSIS — M9905 Segmental and somatic dysfunction of pelvic region: Secondary | ICD-10-CM | POA: Diagnosis not present

## 2022-01-16 DIAGNOSIS — M9903 Segmental and somatic dysfunction of lumbar region: Secondary | ICD-10-CM | POA: Diagnosis not present

## 2022-01-16 DIAGNOSIS — M461 Sacroiliitis, not elsewhere classified: Secondary | ICD-10-CM | POA: Diagnosis not present

## 2022-01-16 MED ORDER — NEOMYCIN-POLYMYXIN-HC 1 % OT SOLN: OTIC | 1 refills | Status: DC

## 2022-01-16 NOTE — Patient Instructions (Signed)

## 2022-01-17 NOTE — Progress Notes (Signed)
Subjective:  Patient ID: Alexander Yoder, male    DOB: Apr 17, 1930,  MRN: [id number] HPI Chief Complaint  Patient presents with   Nail Problem    Right great toe nail ingrown - ongoing for years- no discharge , no pain , no swelling.     88 y.o. male presents with the above complaint.   ROS: Denies fever chills nausea vomiting muscle aches pains calf pain back pain chest pain shortness of breath.  Past Medical History:  Diagnosis Date   Amblyopia of eye, right    Anxiety attack    Cancer ([place])    prostate   Exotropia of right eye    Glaucoma    Hyperlipidemia    Hypothyroidism    Pituitary tumor    Past Surgical History:  Procedure Laterality Date   COLONOSCOPY     EYE SURGERY Bilateral    cataracts removed   PROSTATECTOMY  2000   STRABISMUS SURGERY Right 09/24/2019   Procedure: REPAIR STRABISMUS RIGHT EYE;  Surgeon: Lamonte Sakai, MD;  Location: [place];  Service: Ophthalmology;  Laterality: Right;   TONSILLECTOMY     TRANSPHENOIDAL / TRANSNASAL HYPOPHYSECTOMY / RESECTION PITUITARY TUMOR  1998, 2010   x2 -   WISDOM TOOTH EXTRACTION      Current Outpatient Medications:    NEOMYCIN-POLYMYXIN-HYDROCORTISONE (CORTISPORIN) 1 % SOLN OTIC solution, Apply 1-2 drops to toe BID after soaking, Disp: 10 mL, Rfl: 1   acetaminophen (TYLENOL) 500 MG tablet, Take 500 mg by mouth every 6 (six) hours as needed., Disp: , Rfl:    aspirin EC 81 MG tablet, Take 81 mg by mouth daily., Disp: , Rfl:    B Complex-C (B-COMPLEX WITH VITAMIN C) tablet, Take 1 tablet by mouth daily., Disp: , Rfl:    brimonidine (ALPHAGAN) 0.15 % ophthalmic solution, Place 1 drop into both eyes 2 (two) times daily. , Disp: , Rfl:    Cholecalciferol (VITAMIN D) 50 MCG (2000 UT) CAPS, Take 2,000 Units by mouth daily., Disp: , Rfl:    clopidogrel (PLAVIX) 75 MG tablet, Take 1 tablet by mouth daily., Disp: , Rfl:    desmopressin (DDAVP) 0.2 MG tablet, Take 0.2-0.4 mg by mouth See admin instructions. 2 tabs in the morning,  and 1 tab in the evening, Disp: , Rfl:    hydrocortisone (CORTEF) 10 MG tablet, Take 5-10 mg by mouth See admin instructions. 10 mg in the morning, 5 mg in the evening, Disp: , Rfl:    latanoprost (XALATAN) 0.005 % ophthalmic solution, Place 1 drop into both eyes at bedtime., Disp: , Rfl:    neomycin-polymyxin-dexameth (MAXITROL) 0.1 % OINT, Place 1 application into the right eye 4 (four) times daily. For 1 week, Disp: , Rfl: 0   Omega-3 Fatty Acids (FISH OIL) 1000 MG CAPS, Take 1,000 mg by mouth daily., Disp: , Rfl:    ondansetron (ZOFRAN) 4 MG tablet, Take 1 tablet (4 mg total) by mouth every 8 (eight) hours as needed for nausea or vomiting., Disp: 20 tablet, Rfl: 0   oseltamivir (TAMIFLU) 75 MG capsule, Take 1 capsule (75 mg total) by mouth every 12 (twelve) hours., Disp: 10 capsule, Rfl: 0   rosuvastatin (CRESTOR) 20 MG tablet, Take 5 mg by mouth every Monday, Wednesday, and Friday. , Disp: , Rfl:    SYNTHROID 112 MCG tablet, Take 112 mcg by mouth daily before breakfast. , Disp: , Rfl:    testosterone cypionate (DEPOTESTOSTERONE CYPIONATE) 200 MG/ML injection, SMARTSIG:0.8 Milliliter(s) IM Every 2 Weeks, Disp: ,  Rfl:   Allergies  Allergen Reactions   Peanut-Containing Drug Products Anaphylaxis   Penicillins Other (See Comments)    Did it involve swelling of the face/tongue/throat, SOB, or low BP? Yes Did it involve sudden or severe rash/hives, skin peeling, or any reaction on the inside of your mouth or nose? Yes Did you need to seek medical attention at a hospital or doctor's office? Yes When did it last happen?     2010  If all above answers are NO, may proceed with cephalosporin use.    Review of Systems Objective:  There were no vitals filed for this visit.  General: Well developed, nourished, in no acute distress, alert and oriented x3   Dermatological: Skin is warm, dry and supple bilateral. Nails x 10 are well maintained; remaining integument appears unremarkable at this  time. There are no open sores, no preulcerative lesions, no rash or signs of infection present.  Sharp incurvated toenail hallux right fibular border.  No purulence no malodor  Vascular: Dorsalis Pedis artery and Posterior Tibial artery pedal pulses are 2/4 bilateral with immedate capillary fill time. Pedal hair growth present. No varicosities and no lower extremity edema present bilateral.   Neruologic: Grossly intact via light touch bilateral. Vibratory intact via tuning fork bilateral. Protective threshold with Semmes Wienstein monofilament intact to all pedal sites bilateral. Patellar and Achilles deep tendon reflexes 2+ bilateral. No Babinski or clonus noted bilateral.   Musculoskeletal: No gross boney pedal deformities bilateral. No pain, crepitus, or limitation noted with foot and ankle range of motion bilateral. Muscular strength 5/5 in all groups tested bilateral.  Gait: Unassisted, Nonantalgic.    Radiographs:  None taken  Assessment & Plan:   Assessment: Ingrown toenail fibular border hallux right  Plan: Chemical matricectomy was performed today after local anesthetic was administered he tolerated this procedure well.  Since he is on Plavix we did use Surgicel to help with any excessive bleeding.  He was given both oral and written home-going instruction for the care and soaking of his toes starting tomorrow he also received a prescription for Cortisporin Otic to be applied twice daily after soaking.  He will follow-up with me in 2 weeks he will call with questions or concerns.     Jasilyn Holderman T. [place], [place]

## 2022-01-18 DIAGNOSIS — M9905 Segmental and somatic dysfunction of pelvic region: Secondary | ICD-10-CM | POA: Diagnosis not present

## 2022-01-18 DIAGNOSIS — M9904 Segmental and somatic dysfunction of sacral region: Secondary | ICD-10-CM | POA: Diagnosis not present

## 2022-01-18 DIAGNOSIS — M461 Sacroiliitis, not elsewhere classified: Secondary | ICD-10-CM | POA: Diagnosis not present

## 2022-01-18 DIAGNOSIS — M9903 Segmental and somatic dysfunction of lumbar region: Secondary | ICD-10-CM | POA: Diagnosis not present

## 2022-01-19 DIAGNOSIS — M9905 Segmental and somatic dysfunction of pelvic region: Secondary | ICD-10-CM | POA: Diagnosis not present

## 2022-01-19 DIAGNOSIS — M9904 Segmental and somatic dysfunction of sacral region: Secondary | ICD-10-CM | POA: Diagnosis not present

## 2022-01-19 DIAGNOSIS — M461 Sacroiliitis, not elsewhere classified: Secondary | ICD-10-CM | POA: Diagnosis not present

## 2022-01-19 DIAGNOSIS — M9903 Segmental and somatic dysfunction of lumbar region: Secondary | ICD-10-CM | POA: Diagnosis not present

## 2022-01-23 DIAGNOSIS — M9903 Segmental and somatic dysfunction of lumbar region: Secondary | ICD-10-CM | POA: Diagnosis not present

## 2022-01-23 DIAGNOSIS — M9905 Segmental and somatic dysfunction of pelvic region: Secondary | ICD-10-CM | POA: Diagnosis not present

## 2022-01-23 DIAGNOSIS — M9904 Segmental and somatic dysfunction of sacral region: Secondary | ICD-10-CM | POA: Diagnosis not present

## 2022-01-23 DIAGNOSIS — M461 Sacroiliitis, not elsewhere classified: Secondary | ICD-10-CM | POA: Diagnosis not present

## 2022-01-25 DIAGNOSIS — M461 Sacroiliitis, not elsewhere classified: Secondary | ICD-10-CM | POA: Diagnosis not present

## 2022-01-25 DIAGNOSIS — M9904 Segmental and somatic dysfunction of sacral region: Secondary | ICD-10-CM | POA: Diagnosis not present

## 2022-01-25 DIAGNOSIS — M9905 Segmental and somatic dysfunction of pelvic region: Secondary | ICD-10-CM | POA: Diagnosis not present

## 2022-01-25 DIAGNOSIS — M9903 Segmental and somatic dysfunction of lumbar region: Secondary | ICD-10-CM | POA: Diagnosis not present

## 2022-01-26 DIAGNOSIS — M9904 Segmental and somatic dysfunction of sacral region: Secondary | ICD-10-CM | POA: Diagnosis not present

## 2022-01-26 DIAGNOSIS — M461 Sacroiliitis, not elsewhere classified: Secondary | ICD-10-CM | POA: Diagnosis not present

## 2022-01-26 DIAGNOSIS — M9903 Segmental and somatic dysfunction of lumbar region: Secondary | ICD-10-CM | POA: Diagnosis not present

## 2022-01-26 DIAGNOSIS — M9905 Segmental and somatic dysfunction of pelvic region: Secondary | ICD-10-CM | POA: Diagnosis not present

## 2022-01-30 DIAGNOSIS — M9905 Segmental and somatic dysfunction of pelvic region: Secondary | ICD-10-CM | POA: Diagnosis not present

## 2022-01-30 DIAGNOSIS — M9903 Segmental and somatic dysfunction of lumbar region: Secondary | ICD-10-CM | POA: Diagnosis not present

## 2022-01-30 DIAGNOSIS — M461 Sacroiliitis, not elsewhere classified: Secondary | ICD-10-CM | POA: Diagnosis not present

## 2022-01-30 DIAGNOSIS — M9904 Segmental and somatic dysfunction of sacral region: Secondary | ICD-10-CM | POA: Diagnosis not present

## 2022-02-01 ENCOUNTER — Encounter: Payer: Self-pay | Admitting: Podiatry

## 2022-02-01 ENCOUNTER — Other Ambulatory Visit: Payer: Self-pay

## 2022-02-01 ENCOUNTER — Ambulatory Visit (INDEPENDENT_AMBULATORY_CARE_PROVIDER_SITE_OTHER): Payer: Medicare Other | Admitting: Podiatry

## 2022-02-01 DIAGNOSIS — B351 Tinea unguium: Secondary | ICD-10-CM | POA: Diagnosis not present

## 2022-02-01 DIAGNOSIS — L603 Nail dystrophy: Secondary | ICD-10-CM | POA: Diagnosis not present

## 2022-02-01 DIAGNOSIS — L600 Ingrowing nail: Secondary | ICD-10-CM | POA: Diagnosis not present

## 2022-02-01 DIAGNOSIS — M9904 Segmental and somatic dysfunction of sacral region: Secondary | ICD-10-CM | POA: Diagnosis not present

## 2022-02-01 DIAGNOSIS — Z9889 Other specified postprocedural states: Secondary | ICD-10-CM | POA: Diagnosis not present

## 2022-02-01 DIAGNOSIS — M9903 Segmental and somatic dysfunction of lumbar region: Secondary | ICD-10-CM | POA: Diagnosis not present

## 2022-02-01 DIAGNOSIS — M461 Sacroiliitis, not elsewhere classified: Secondary | ICD-10-CM | POA: Diagnosis not present

## 2022-02-01 DIAGNOSIS — M9905 Segmental and somatic dysfunction of pelvic region: Secondary | ICD-10-CM | POA: Diagnosis not present

## 2022-02-01 NOTE — Progress Notes (Signed)
He presents today for follow-up of his matrixectomy hallux right fibular border states that is doing great I think it looks perfect and feels so much better.  I am concerned about the discoloration of the toenails on the left foot.  He states that they are thick and painful.  He states that he is continuing to soak his right foot on a regular basis and wonders if he should soak his left foot as well. ? ?Objective: Vital signs are stable he is alert and oriented x3 there is some mild erythema and a proximal nail fold where the matrixectomy was performed along the fibular border of the hallux right there is no purulence no malodor.  Left foot does demonstrate thick yellow dystrophic possibly mycotic nails. ? ?Assessment: Well-healing matrixectomy with some remaining erythema right. ? ?Nail dystrophy toes 1 through 5 of the left foot.  Hallux appears to be worse. ? ?Plan: Samples of the left skin and nail were taken today for pathologic evaluation.  I encouraged him to continue to soak the right foot and Epsom salts and warm water cover during the day leave open at bedtime.  He understands this and is amendable to it he will go ahead and soak both feet he says.  I will follow-up with him in 1 month to discuss our options. ?

## 2022-02-02 DIAGNOSIS — M9905 Segmental and somatic dysfunction of pelvic region: Secondary | ICD-10-CM | POA: Diagnosis not present

## 2022-02-02 DIAGNOSIS — M9904 Segmental and somatic dysfunction of sacral region: Secondary | ICD-10-CM | POA: Diagnosis not present

## 2022-02-02 DIAGNOSIS — M461 Sacroiliitis, not elsewhere classified: Secondary | ICD-10-CM | POA: Diagnosis not present

## 2022-02-02 DIAGNOSIS — M9903 Segmental and somatic dysfunction of lumbar region: Secondary | ICD-10-CM | POA: Diagnosis not present

## 2022-02-06 DIAGNOSIS — M9903 Segmental and somatic dysfunction of lumbar region: Secondary | ICD-10-CM | POA: Diagnosis not present

## 2022-02-06 DIAGNOSIS — M9905 Segmental and somatic dysfunction of pelvic region: Secondary | ICD-10-CM | POA: Diagnosis not present

## 2022-02-06 DIAGNOSIS — M9904 Segmental and somatic dysfunction of sacral region: Secondary | ICD-10-CM | POA: Diagnosis not present

## 2022-02-06 DIAGNOSIS — M461 Sacroiliitis, not elsewhere classified: Secondary | ICD-10-CM | POA: Diagnosis not present

## 2022-03-06 ENCOUNTER — Ambulatory Visit (INDEPENDENT_AMBULATORY_CARE_PROVIDER_SITE_OTHER): Payer: Medicare Other | Admitting: Podiatry

## 2022-03-06 ENCOUNTER — Encounter: Payer: Self-pay | Admitting: Podiatry

## 2022-03-06 DIAGNOSIS — L603 Nail dystrophy: Secondary | ICD-10-CM | POA: Diagnosis not present

## 2022-03-06 MED ORDER — TERBINAFINE HCL 250 MG PO TABS: 250.0000 mg | ORAL_TABLET | Freq: Every day | ORAL | 0 refills | Status: DC

## 2022-03-06 NOTE — Progress Notes (Signed)
He presents today for follow-up of his matrixectomy hallux right.  He denies fever chills nausea vomiting states that he is doing just great. ? ?Objective: Vital signs are stable he is alert and oriented x3.  There is no erythema edema cellulitis drainage or odor toenail appears to be healing perfectly. ? 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 ? ?Assessment: Well-healing surgical toenail and onychomycosis. ? ?Plan: We will start him on oral therapy we did discuss topical therapy and laser therapy.  We discussed the side effects of oral therapy will go ahead and get him started on this oral therapy 250 mg tablets of Lamisil 1 p.o. daily for the next 30 days blood work will be performed at the end of this.  I will follow-up with him at that time. ?

## 2022-03-26 DIAGNOSIS — E785 Hyperlipidemia, unspecified: Secondary | ICD-10-CM | POA: Diagnosis not present

## 2022-03-26 DIAGNOSIS — E232 Diabetes insipidus: Secondary | ICD-10-CM | POA: Diagnosis not present

## 2022-03-26 DIAGNOSIS — D497 Neoplasm of unspecified behavior of endocrine glands and other parts of nervous system: Secondary | ICD-10-CM | POA: Diagnosis not present

## 2022-03-26 DIAGNOSIS — I10 Essential (primary) hypertension: Secondary | ICD-10-CM | POA: Diagnosis not present

## 2022-03-26 DIAGNOSIS — M5430 Sciatica, unspecified side: Secondary | ICD-10-CM | POA: Diagnosis not present

## 2022-03-27 DIAGNOSIS — M5416 Radiculopathy, lumbar region: Secondary | ICD-10-CM | POA: Diagnosis not present

## 2022-03-29 DIAGNOSIS — M545 Low back pain, unspecified: Secondary | ICD-10-CM | POA: Diagnosis not present

## 2022-03-30 DIAGNOSIS — M545 Low back pain, unspecified: Secondary | ICD-10-CM | POA: Diagnosis not present

## 2022-04-02 ENCOUNTER — Other Ambulatory Visit: Payer: Self-pay | Admitting: Sports Medicine

## 2022-04-02 DIAGNOSIS — M545 Low back pain, unspecified: Secondary | ICD-10-CM

## 2022-04-04 DIAGNOSIS — M5416 Radiculopathy, lumbar region: Secondary | ICD-10-CM | POA: Diagnosis not present

## 2022-04-06 DIAGNOSIS — N3942 Incontinence without sensory awareness: Secondary | ICD-10-CM | POA: Diagnosis not present

## 2022-04-06 DIAGNOSIS — R32 Unspecified urinary incontinence: Secondary | ICD-10-CM | POA: Diagnosis not present

## 2022-04-11 DIAGNOSIS — M5430 Sciatica, unspecified side: Secondary | ICD-10-CM | POA: Diagnosis not present

## 2022-04-11 DIAGNOSIS — I10 Essential (primary) hypertension: Secondary | ICD-10-CM | POA: Diagnosis not present

## 2022-04-26 ENCOUNTER — Encounter: Payer: Self-pay | Admitting: Podiatry

## 2022-04-26 ENCOUNTER — Ambulatory Visit (INDEPENDENT_AMBULATORY_CARE_PROVIDER_SITE_OTHER): Payer: Medicare Other | Admitting: Podiatry

## 2022-04-26 DIAGNOSIS — L603 Nail dystrophy: Secondary | ICD-10-CM

## 2022-04-26 DIAGNOSIS — Z79899 Other long term (current) drug therapy: Secondary | ICD-10-CM

## 2022-04-26 MED ORDER — TERBINAFINE HCL 250 MG PO TABS: 250.0000 mg | ORAL_TABLET | Freq: Every day | ORAL | 0 refills | Status: DC

## 2022-04-26 NOTE — Progress Notes (Signed)
He presents today for follow-up of his Lamisil.  He denies fever chills nausea vomiting muscle aches pains scratching itching rashes.  States that the toenails are starting to look a little better at the base just had a pedicure done yesterday.  Objective: Vital signs are stable he is alert oriented x3 there is no erythema edema salines drainage or odor no change in physical examination.  May be clearing a little bit proximal near the lingula.  Assessment: Onychomycosis long-term therapy with Lamisil.  Plan: Requesting complete metabolic panel and prescribing another 90 days of Lamisil.  Will notify him with any concerns Regarding his blood work.

## 2022-04-27 LAB — COMPREHENSIVE METABOLIC PANEL
AG Ratio: 1.8 (calc) (ref 1.0–2.5)
ALT: 27 U/L (ref 9–46)
AST: 20 U/L (ref 10–35)
Albumin: 4.3 g/dL (ref 3.6–5.1)
Alkaline phosphatase (APISO): 72 U/L (ref 35–144)
BUN: 15 mg/dL (ref 7–25)
CO2: 25 mmol/L (ref 20–32)
Calcium: 9.4 mg/dL (ref 8.6–10.3)
Chloride: 101 mmol/L (ref 98–110)
Creat: 0.95 mg/dL (ref 0.70–1.22)
Globulin: 2.4 g/dL (calc) (ref 1.9–3.7)
Glucose, Bld: 72 mg/dL (ref 65–139)
Potassium: 4.1 mmol/L (ref 3.5–5.3)
Sodium: 136 mmol/L (ref 135–146)
Total Bilirubin: 0.7 mg/dL (ref 0.2–1.2)
Total Protein: 6.7 g/dL (ref 6.1–8.1)

## 2022-05-01 DIAGNOSIS — M5416 Radiculopathy, lumbar region: Secondary | ICD-10-CM | POA: Diagnosis not present

## 2022-05-31 ENCOUNTER — Ambulatory Visit
Admission: RE | Admit: 2022-05-31 | Discharge: 2022-05-31 | Disposition: A | Discharge status: Home / Self Care | Payer: Medicare Other | Source: Ambulatory Visit | Attending: Internal Medicine | Admitting: Internal Medicine

## 2022-05-31 ENCOUNTER — Other Ambulatory Visit: Payer: Self-pay | Admitting: Internal Medicine

## 2022-05-31 DIAGNOSIS — E230 Hypopituitarism: Secondary | ICD-10-CM | POA: Diagnosis not present

## 2022-05-31 DIAGNOSIS — E232 Diabetes insipidus: Secondary | ICD-10-CM | POA: Diagnosis not present

## 2022-05-31 DIAGNOSIS — R059 Cough, unspecified: Secondary | ICD-10-CM

## 2022-05-31 DIAGNOSIS — Z125 Encounter for screening for malignant neoplasm of prostate: Secondary | ICD-10-CM | POA: Diagnosis not present

## 2022-05-31 DIAGNOSIS — D497 Neoplasm of unspecified behavior of endocrine glands and other parts of nervous system: Secondary | ICD-10-CM | POA: Diagnosis not present

## 2022-05-31 DIAGNOSIS — R0602 Shortness of breath: Secondary | ICD-10-CM | POA: Diagnosis not present

## 2022-06-04 DIAGNOSIS — H04123 Dry eye syndrome of bilateral lacrimal glands: Secondary | ICD-10-CM | POA: Diagnosis not present

## 2022-06-04 DIAGNOSIS — H47293 Other optic atrophy, bilateral: Secondary | ICD-10-CM | POA: Diagnosis not present

## 2022-06-04 DIAGNOSIS — H401133 Primary open-angle glaucoma, bilateral, severe stage: Secondary | ICD-10-CM | POA: Diagnosis not present

## 2022-06-20 DIAGNOSIS — E871 Hypo-osmolality and hyponatremia: Secondary | ICD-10-CM | POA: Diagnosis not present

## 2022-06-20 DIAGNOSIS — E230 Hypopituitarism: Secondary | ICD-10-CM | POA: Diagnosis not present

## 2022-06-28 DIAGNOSIS — E291 Testicular hypofunction: Secondary | ICD-10-CM | POA: Diagnosis not present

## 2022-06-28 DIAGNOSIS — E230 Hypopituitarism: Secondary | ICD-10-CM | POA: Diagnosis not present

## 2022-06-28 DIAGNOSIS — E871 Hypo-osmolality and hyponatremia: Secondary | ICD-10-CM | POA: Diagnosis not present

## 2022-07-10 DIAGNOSIS — M47816 Spondylosis without myelopathy or radiculopathy, lumbar region: Secondary | ICD-10-CM | POA: Diagnosis not present

## 2022-07-10 DIAGNOSIS — M5416 Radiculopathy, lumbar region: Secondary | ICD-10-CM | POA: Diagnosis not present

## 2022-08-28 ENCOUNTER — Encounter: Payer: Self-pay | Admitting: Podiatry

## 2022-08-28 ENCOUNTER — Ambulatory Visit (INDEPENDENT_AMBULATORY_CARE_PROVIDER_SITE_OTHER): Payer: Medicare Other | Admitting: Podiatry

## 2022-08-28 DIAGNOSIS — L603 Nail dystrophy: Secondary | ICD-10-CM | POA: Diagnosis not present

## 2022-08-28 DIAGNOSIS — Z79899 Other long term (current) drug therapy: Secondary | ICD-10-CM

## 2022-08-28 MED ORDER — TERBINAFINE HCL 250 MG PO TABS: 250.0000 mg | ORAL_TABLET | Freq: Every day | ORAL | 0 refills | Status: DC

## 2022-08-28 NOTE — Progress Notes (Signed)
He presents today for follow-up of his Lamisil therapy.  He states he is completed 120 days with no side effects.  He goes on to say that he is doing really well with this and that his nails are growing out nicely the hallux left he injured it by dropping something on it and the right one is just slower to grow he says.  Objective: Vital signs stable alert orient x3 there is no erythema edema cellulitis drainage or odor thick hallux nails appear to be growing out and becoming lighter and thinner the lesser nail plates are doing much better nearly 75% grown out at this point.  Assessment: Resolving onychomycosis.  Plan: I will continue an every other day dose with him.  We are going to start him on Lamisil provide him with 30 tablets he will take 1 tablet every other day and I will follow-up with him on the third month.

## 2022-09-06 ENCOUNTER — Inpatient Hospital Stay (HOSPITAL_COMMUNITY)
Admission: EM | Admit: 2022-09-06 | Discharge: 2022-09-10 | DRG: 641 | Disposition: A | Discharge status: Home / Self Care | Payer: Medicare Other | Attending: Internal Medicine | Admitting: Internal Medicine

## 2022-09-06 ENCOUNTER — Encounter (HOSPITAL_COMMUNITY): Payer: Self-pay | Admitting: Emergency Medicine

## 2022-09-06 ENCOUNTER — Other Ambulatory Visit: Payer: Self-pay | Admitting: Internal Medicine

## 2022-09-06 ENCOUNTER — Other Ambulatory Visit: Payer: Self-pay

## 2022-09-06 ENCOUNTER — Ambulatory Visit
Admission: RE | Admit: 2022-09-06 | Discharge: 2022-09-06 | Disposition: A | Discharge status: Home / Self Care | Payer: Medicare Other | Source: Ambulatory Visit | Attending: Internal Medicine | Admitting: Internal Medicine

## 2022-09-06 ENCOUNTER — Emergency Department (HOSPITAL_COMMUNITY): Payer: Medicare Other

## 2022-09-06 DIAGNOSIS — R479 Unspecified speech disturbances: Secondary | ICD-10-CM | POA: Diagnosis not present

## 2022-09-06 DIAGNOSIS — Z7982 Long term (current) use of aspirin: Secondary | ICD-10-CM

## 2022-09-06 DIAGNOSIS — Z8042 Family history of malignant neoplasm of prostate: Secondary | ICD-10-CM

## 2022-09-06 DIAGNOSIS — R410 Disorientation, unspecified: Secondary | ICD-10-CM | POA: Diagnosis not present

## 2022-09-06 DIAGNOSIS — E871 Hypo-osmolality and hyponatremia: Secondary | ICD-10-CM | POA: Diagnosis not present

## 2022-09-06 DIAGNOSIS — E039 Hypothyroidism, unspecified: Secondary | ICD-10-CM | POA: Diagnosis not present

## 2022-09-06 DIAGNOSIS — Y92009 Unspecified place in unspecified non-institutional (private) residence as the place of occurrence of the external cause: Secondary | ICD-10-CM

## 2022-09-06 DIAGNOSIS — Z79890 Hormone replacement therapy (postmenopausal): Secondary | ICD-10-CM

## 2022-09-06 DIAGNOSIS — Z79899 Other long term (current) drug therapy: Secondary | ICD-10-CM

## 2022-09-06 DIAGNOSIS — Z880 Allergy status to penicillin: Secondary | ICD-10-CM | POA: Diagnosis not present

## 2022-09-06 DIAGNOSIS — E232 Diabetes insipidus: Secondary | ICD-10-CM | POA: Diagnosis present

## 2022-09-06 DIAGNOSIS — R1111 Vomiting without nausea: Secondary | ICD-10-CM | POA: Diagnosis not present

## 2022-09-06 DIAGNOSIS — W010XXA Fall on same level from slipping, tripping and stumbling without subsequent striking against object, initial encounter: Secondary | ICD-10-CM | POA: Diagnosis present

## 2022-09-06 DIAGNOSIS — W19XXXA Unspecified fall, initial encounter: Secondary | ICD-10-CM

## 2022-09-06 DIAGNOSIS — E860 Dehydration: Secondary | ICD-10-CM | POA: Diagnosis present

## 2022-09-06 DIAGNOSIS — S199XXA Unspecified injury of neck, initial encounter: Secondary | ICD-10-CM | POA: Diagnosis not present

## 2022-09-06 DIAGNOSIS — I672 Cerebral atherosclerosis: Secondary | ICD-10-CM | POA: Diagnosis not present

## 2022-09-06 DIAGNOSIS — R519 Headache, unspecified: Secondary | ICD-10-CM

## 2022-09-06 DIAGNOSIS — Z8546 Personal history of malignant neoplasm of prostate: Secondary | ICD-10-CM | POA: Diagnosis not present

## 2022-09-06 DIAGNOSIS — R413 Other amnesia: Secondary | ICD-10-CM | POA: Diagnosis not present

## 2022-09-06 DIAGNOSIS — D751 Secondary polycythemia: Secondary | ICD-10-CM | POA: Diagnosis present

## 2022-09-06 DIAGNOSIS — I5032 Chronic diastolic (congestive) heart failure: Secondary | ICD-10-CM | POA: Diagnosis present

## 2022-09-06 DIAGNOSIS — Z9079 Acquired absence of other genital organ(s): Secondary | ICD-10-CM | POA: Diagnosis not present

## 2022-09-06 DIAGNOSIS — E2740 Unspecified adrenocortical insufficiency: Secondary | ICD-10-CM | POA: Diagnosis present

## 2022-09-06 DIAGNOSIS — R61 Generalized hyperhidrosis: Secondary | ICD-10-CM | POA: Diagnosis not present

## 2022-09-06 DIAGNOSIS — I10 Essential (primary) hypertension: Secondary | ICD-10-CM | POA: Diagnosis not present

## 2022-09-06 DIAGNOSIS — R112 Nausea with vomiting, unspecified: Secondary | ICD-10-CM | POA: Diagnosis not present

## 2022-09-06 DIAGNOSIS — E861 Hypovolemia: Secondary | ICD-10-CM | POA: Diagnosis present

## 2022-09-06 DIAGNOSIS — Z91010 Allergy to peanuts: Secondary | ICD-10-CM

## 2022-09-06 DIAGNOSIS — B353 Tinea pedis: Secondary | ICD-10-CM | POA: Diagnosis present

## 2022-09-06 DIAGNOSIS — R9431 Abnormal electrocardiogram [ECG] [EKG]: Secondary | ICD-10-CM | POA: Diagnosis not present

## 2022-09-06 DIAGNOSIS — E236 Other disorders of pituitary gland: Secondary | ICD-10-CM | POA: Diagnosis not present

## 2022-09-06 DIAGNOSIS — E785 Hyperlipidemia, unspecified: Secondary | ICD-10-CM | POA: Diagnosis present

## 2022-09-06 DIAGNOSIS — E038 Other specified hypothyroidism: Secondary | ICD-10-CM | POA: Diagnosis present

## 2022-09-06 DIAGNOSIS — R531 Weakness: Secondary | ICD-10-CM | POA: Diagnosis not present

## 2022-09-06 DIAGNOSIS — I509 Heart failure, unspecified: Secondary | ICD-10-CM | POA: Diagnosis not present

## 2022-09-06 DIAGNOSIS — R42 Dizziness and giddiness: Secondary | ICD-10-CM | POA: Diagnosis not present

## 2022-09-06 DIAGNOSIS — Z7902 Long term (current) use of antithrombotics/antiplatelets: Secondary | ICD-10-CM

## 2022-09-06 DIAGNOSIS — E230 Hypopituitarism: Secondary | ICD-10-CM | POA: Diagnosis present

## 2022-09-06 DIAGNOSIS — T38895A Adverse effect of other hormones and synthetic substitutes, initial encounter: Secondary | ICD-10-CM | POA: Diagnosis present

## 2022-09-06 DIAGNOSIS — E8779 Other fluid overload: Secondary | ICD-10-CM | POA: Diagnosis not present

## 2022-09-06 DIAGNOSIS — S0990XA Unspecified injury of head, initial encounter: Secondary | ICD-10-CM | POA: Diagnosis not present

## 2022-09-06 LAB — I-STAT CHEM 8, ED
BUN: 5 mg/dL — ABNORMAL LOW (ref 8–23)
Calcium, Ion: 1.11 mmol/L — ABNORMAL LOW (ref 1.15–1.40)
Chloride: 89 mmol/L — ABNORMAL LOW (ref 98–111)
Creatinine, Ser: 0.8 mg/dL (ref 0.61–1.24)
Glucose, Bld: 96 mg/dL (ref 70–99)
HCT: 54 % — ABNORMAL HIGH (ref 39.0–52.0)
Hemoglobin: 18.4 g/dL — ABNORMAL HIGH (ref 13.0–17.0)
Potassium: 3.7 mmol/L (ref 3.5–5.1)
Sodium: 124 mmol/L — ABNORMAL LOW (ref 135–145)
TCO2: 23 mmol/L (ref 22–32)

## 2022-09-06 MED ORDER — ONDANSETRON HCL 4 MG/2ML IJ SOLN
4.0000 mg | Freq: Once | INTRAMUSCULAR | Status: AC
  Administered 2022-09-06: 4 mg via INTRAVENOUS
  Filled 2022-09-06: qty 2

## 2022-09-06 MED ORDER — SODIUM CHLORIDE 0.9 % IV BOLUS
1000.0000 mL | Freq: Once | INTRAVENOUS | Status: AC
  Administered 2022-09-06: 1000 mL via INTRAVENOUS

## 2022-09-06 NOTE — ED Triage Notes (Signed)
Pt BIB [facility] from home, pt had unwitnessed fall, is on a blood thinner. Pt seen by his PCP this morning for confusion, was found to have low sodium. Denies pain, c/o nausea. EMS VS: BP 162/92, SpO2 100% room air, HR 56

## 2022-09-06 NOTE — ED Provider Notes (Signed)
[facility] EMERGENCY DEPARTMENT Provider Note  CSN: [id number] Arrival date & time: 09/06/22 2313  Chief Complaint(s) Fall  HPI Alexander Yoder is a 87 y.o. male with a past medical history listed below including pituitary tumor, hyponatremia on desmopressin who presents after an unwitnessed fall at home.  Patient is on Plavix and made a level 2 trauma.  He was brought in by EMS.  Per the report patient has been confused today.  He was taken to his PCP who told the patient he had low sodium.  They also did a CT scan to look for stroke.   Patient remembers getting up to go to the bathroom due to nausea.  He does not remember the fall.  He denies any current headache, neck pain, chest pain, back pain, abdominal pain, or extremity weakness.  His only complaint is severe nausea.  He has had several episodes of nonbloody nonbilious emesis.  No no sick contacts.  No recent infections.  The history is provided by the patient and the EMS personnel.    Past Medical History Past Medical History:  Diagnosis Date   Amblyopia of eye, right    Anxiety attack    Cancer ([place])    prostate   Exotropia of right eye    Glaucoma    Hyperlipidemia    Hypothyroidism    Pituitary tumor    Patient Active Problem List   Diagnosis Date Noted   Acute hyponatremia 09/07/2022   Home Medication(s) Prior to Admission medications   Medication Sig Start Date End Date Taking? Authorizing Provider  acetaminophen (TYLENOL) 500 MG tablet Take 500 mg by mouth every 6 (six) hours as needed.    [provider]  aspirin EC 81 MG tablet Take 81 mg by mouth daily.    [provider]  B Complex-C (B-COMPLEX WITH VITAMIN C) tablet Take 1 tablet by mouth daily.    [provider]  brimonidine (ALPHAGAN) 0.15 % ophthalmic solution Place 1 drop into both eyes 2 (two) times daily.     [provider]  Cholecalciferol (VITAMIN D) 50 MCG (2000 UT) CAPS Take 2,000 Units by  mouth daily.    [provider]  clopidogrel (PLAVIX) 75 MG tablet Take 1 tablet by mouth daily. 03/29/21   [provider]  desmopressin (DDAVP) 0.2 MG tablet Take 0.2-0.4 mg by mouth See admin instructions. 2 tabs in the morning, and 1 tab in the evening    [provider]  hydrocortisone (CORTEF) 10 MG tablet Take 5-10 mg by mouth See admin instructions. 10 mg in the morning, 5 mg in the evening    [provider]  latanoprost (XALATAN) 0.005 % ophthalmic solution Place 1 drop into both eyes at bedtime.    [provider]  neomycin-polymyxin-dexameth (MAXITROL) 0.1 % OINT Place 1 application into the right eye 4 (four) times daily. For 1 week 09/24/19   Lamonte Sakai, MD  NEOMYCIN-POLYMYXIN-HYDROCORTISONE (CORTISPORIN) 1 % SOLN OTIC solution Apply 1-2 drops to toe BID after soaking 01/16/22   Hyatt, Max T, DPM  Omega-3 Fatty Acids (FISH OIL) 1000 MG CAPS Take 1,000 mg by mouth daily.    [provider]  ondansetron (ZOFRAN) 4 MG tablet Take 1 tablet (4 mg total) by mouth every 8 (eight) hours as needed for nausea or vomiting. 10/20/21   Carvel Getting, NP  oseltamivir (TAMIFLU) 75 MG capsule Take 1 capsule (75 mg total) by mouth every 12 (twelve) hours. 10/20/21  Carvel Getting, NP  rosuvastatin (CRESTOR) 20 MG tablet Take 5 mg by mouth every Monday, Wednesday, and Friday.     [provider]  SYNTHROID 112 MCG tablet Take 112 mcg by mouth daily before breakfast.  07/29/19   [provider]  terbinafine (LAMISIL) 250 MG tablet Take 1 tablet (250 mg total) by mouth daily. 04/26/22   Hyatt, Max T, DPM  terbinafine (LAMISIL) 250 MG tablet Take 1 tablet (250 mg total) by mouth daily. 08/28/22   Hyatt, Max T, DPM  testosterone cypionate (DEPOTESTOSTERONE CYPIONATE) 200 MG/ML injection SMARTSIG:0.8 Milliliter(s) IM Every 2 Weeks 10/31/20   [provider]                                                                                                                                     Allergies Peanut-containing drug products and Penicillins  Review of Systems Review of Systems As noted in HPI  Physical Exam Vital Signs  I have reviewed the triage vital signs BP (!) 156/94   Pulse (!) 57   Temp (!) 97.4 F (36.3 C) (Oral)   Resp 16   Ht '5\' 10"'$  (1.778 m)   Wt 79 kg   SpO2 97%   BMI 24.99 kg/m   Physical Exam Vitals reviewed.  Constitutional:      General: He is not in acute distress.    Appearance: He is well-developed. He is not diaphoretic.  HENT:     Head: Normocephalic. Contusion present.      Right Ear: External ear normal.     Left Ear: External ear normal.     Nose: Nose normal.  Eyes:     General: No scleral icterus.       Right eye: No discharge.        Left eye: No discharge.     Conjunctiva/sclera: Conjunctivae normal.     Pupils: Pupils are equal, round, and reactive to light.  Cardiovascular:     Rate and Rhythm: Normal rate and regular rhythm.     Pulses:          Radial pulses are 2+ on the right side and 2+ on the left side.       Dorsalis pedis pulses are 2+ on the right side and 2+ on the left side.     Heart sounds: Normal heart sounds. No murmur heard.    No friction rub. No gallop.  Pulmonary:     Effort: Pulmonary effort is normal. No respiratory distress.     Breath sounds: Normal breath sounds. No stridor. No rales.  Abdominal:     General: There is no distension.     Palpations: Abdomen is soft.     Tenderness: There is no abdominal tenderness.  Musculoskeletal:        General: No tenderness.     Cervical back: Normal range of motion and neck supple. No bony tenderness.  Thoracic back: No bony tenderness.     Lumbar back: No bony tenderness.     Comments: Clavicle stable. Chest stable to AP/Lat compression. Pelvis stable to Lat compression. No obvious extremity deformity. No chest or abdominal wall contusion.  Skin:    General: Skin is warm and dry.      Findings: No erythema or rash.  Neurological:     Mental Status: He is alert and oriented to person, place, and time.     GCS: GCS eye subscore is 4. GCS verbal subscore is 5. GCS motor subscore is 6.     Comments: Mental Status:  Alert and oriented to person, place, and time.  Attention and concentration normal.  Speech clear.  Recent memory is intact  Cranial Nerves:  II Visual Fields: Intact to confrontation. Visual fields intact. III, IV, VI: Pupils equal and reactive to light and near. Full eye movement without nystagmus  V Facial Sensation: Normal. No weakness of masticatory muscles  VII: No facial weakness or asymmetry  VIII Auditory Acuity: Grossly normal  IX/X: The uvula is midline; the palate elevates symmetrically  XI: Normal sternocleidomastoid and trapezius strength  XII: The tongue is midline. No atrophy or fasciculations.   Motor System: Muscle Strength: 5/5 and symmetric in the upper and lower extremities.  Muscle Tone: Tone and muscle bulk are normal in the upper and lower extremities.  Reflexes: No Clonus Coordination:  No tremor.  Sensation: Intact to light touch Gait: deferred      ED Results and Treatments Labs (all labs ordered are listed, but only abnormal results are displayed) Labs Reviewed  COMPREHENSIVE METABOLIC PANEL - Abnormal; Notable for the following components:      Result Value   Sodium 124 (*)    Chloride 92 (*)    CO2 20 (*)    BUN <5 (*)    All other components within normal limits  LIPASE, BLOOD - Abnormal; Notable for the following components:   Lipase 55 (*)    All other components within normal limits  CBC WITH DIFFERENTIAL/PLATELET - Abnormal; Notable for the following components:   RBC 6.08 (*)    Hemoglobin 18.0 (*)    MCHC 36.2 (*)    All other components within normal limits  URINALYSIS, ROUTINE W REFLEX MICROSCOPIC - Abnormal; Notable for the following components:   Color, Urine STRAW (*)    All other components within  normal limits  TSH - Abnormal; Notable for the following components:   TSH 0.056 (*)    All other components within normal limits  I-STAT CHEM 8, ED - Abnormal; Notable for the following components:   Sodium 124 (*)    Chloride 89 (*)    BUN 5 (*)    Calcium, Ion 1.11 (*)    Hemoglobin 18.4 (*)    HCT 54.0 (*)    All other components within normal limits  NA AND K (SODIUM & POTASSIUM), RAND UR  PROTIME-INR  T4, FREE  CORTISOL  OSMOLALITY, URINE  OSMOLALITY  CBC WITH DIFFERENTIAL/PLATELET  MAGNESIUM  COMPREHENSIVE METABOLIC PANEL  BASIC METABOLIC PANEL  CREATININE, URINE, RANDOM  URIC ACID  BRAIN NATRIURETIC PEPTIDE  TROPONIN I (HIGH SENSITIVITY)  TROPONIN I (HIGH SENSITIVITY)  EKG  EKG Interpretation  Date/Time:    Ventricular Rate:    PR Interval:    QRS Duration:   QT Interval:    QTC Calculation:   R Axis:     Text Interpretation:         Radiology CT HEAD WO CONTRAST (5MM)  Result Date: 09/06/2022 CLINICAL DATA:  Head trauma, minor (Age >= 65y); Neck trauma (Age >= 65y) EXAM: CT HEAD WITHOUT CONTRAST CT CERVICAL SPINE WITHOUT CONTRAST TECHNIQUE: Multidetector CT imaging of the head and cervical spine was performed following the standard protocol without intravenous contrast. Multiplanar CT image reconstructions of the cervical spine were also generated. RADIATION DOSE REDUCTION: This exam was performed according to the departmental dose-optimization program which includes automated exposure control, adjustment of the mA and/or kV according to patient size and/or use of iterative reconstruction technique. COMPARISON:  None Available. FINDINGS: CT HEAD FINDINGS BRAIN: BRAIN Cerebral ventricle sizes are concordant with the degree of cerebral volume loss. Patchy and confluent areas of decreased attenuation are noted throughout the deep and  periventricular white matter of the cerebral hemispheres bilaterally, compatible with chronic microvascular ischemic disease. No evidence of large-territorial acute infarction. No parenchymal hemorrhage. Redemonstration of an enlarged pituitary gland measuring up to 2.1 cm. No extra-axial collection. No mass effect or midline shift. No hydrocephalus. Basilar cisterns are patent. Vascular: No hyperdense vessel. Atherosclerotic calcifications are present within the cavernous internal carotid and vertebral arteries. Skull: No acute fracture or focal lesion. Sinuses/Orbits: Paranasal sinuses and mastoid air cells are clear. Bilateral lens replacement. Otherwise the orbits are unremarkable. Other: None. CT CERVICAL SPINE FINDINGS Alignment: Normal. Skull base and vertebrae: Multilevel degenerative changes of the spine. No associated severe osseous neural foraminal or central canal stenosis. No acute fracture. No aggressive appearing focal osseous lesion or focal pathologic process. Soft tissues and spinal canal: No prevertebral fluid or swelling. No visible canal hematoma. Upper chest: Unremarkable. Other: None. IMPRESSION: 1. No acute intracranial abnormality. 2. No acute displaced fracture or traumatic listhesis of the cervical spine. 3. Chronic pituitary mass lesion measuring up to 2.1 cm consistent with known lesion better evaluated on MRI head 10/09/2020. Electronically Signed   By: Iven Finn M.D.   On: 09/06/2022 23:57   CT Cervical Spine Wo Contrast  Result Date: 09/06/2022 CLINICAL DATA:  Head trauma, minor (Age >= 65y); Neck trauma (Age >= 65y) EXAM: CT HEAD WITHOUT CONTRAST CT CERVICAL SPINE WITHOUT CONTRAST TECHNIQUE: Multidetector CT imaging of the head and cervical spine was performed following the standard protocol without intravenous contrast. Multiplanar CT image reconstructions of the cervical spine were also generated. RADIATION DOSE REDUCTION: This exam was performed according to the  departmental dose-optimization program which includes automated exposure control, adjustment of the mA and/or kV according to patient size and/or use of iterative reconstruction technique. COMPARISON:  None Available. FINDINGS: CT HEAD FINDINGS BRAIN: BRAIN Cerebral ventricle sizes are concordant with the degree of cerebral volume loss. Patchy and confluent areas of decreased attenuation are noted throughout the deep and periventricular white matter of the cerebral hemispheres bilaterally, compatible with chronic microvascular ischemic disease. No evidence of large-territorial acute infarction. No parenchymal hemorrhage. Redemonstration of an enlarged pituitary gland measuring up to 2.1 cm. No extra-axial collection. No mass effect or midline shift. No hydrocephalus. Basilar cisterns are patent. Vascular: No hyperdense vessel. Atherosclerotic calcifications are present within the cavernous internal carotid and vertebral arteries. Skull: No acute fracture or focal lesion. Sinuses/Orbits: Paranasal sinuses and mastoid air cells are clear. Bilateral  lens replacement. Otherwise the orbits are unremarkable. Other: None. CT CERVICAL SPINE FINDINGS Alignment: Normal. Skull base and vertebrae: Multilevel degenerative changes of the spine. No associated severe osseous neural foraminal or central canal stenosis. No acute fracture. No aggressive appearing focal osseous lesion or focal pathologic process. Soft tissues and spinal canal: No prevertebral fluid or swelling. No visible canal hematoma. Upper chest: Unremarkable. Other: None. IMPRESSION: 1. No acute intracranial abnormality. 2. No acute displaced fracture or traumatic listhesis of the cervical spine. 3. Chronic pituitary mass lesion measuring up to 2.1 cm consistent with known lesion better evaluated on MRI head 10/09/2020. Electronically Signed   By: Iven Finn M.D.   On: 09/06/2022 23:57   CT HEAD WO CONTRAST (5MM)  Result Date: 09/06/2022 CLINICAL DATA:   88 year old male with history of confusion and difficulty speaking. Headaches. Dizziness. Memory loss. EXAM: CT HEAD WITHOUT CONTRAST TECHNIQUE: Contiguous axial images were obtained from the base of the skull through the vertex without intravenous contrast. RADIATION DOSE REDUCTION: This exam was performed according to the departmental dose-optimization program which includes automated exposure control, adjustment of the mA and/or kV according to patient size and/or use of iterative reconstruction technique. COMPARISON:  Head CT 03/28/2021. 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 Mild cerebral atrophy. Patchy and confluent areas of decreased attenuation are noted throughout the deep and periventricular white matter of the cerebral hemispheres bilaterally, compatible with chronic microvascular ischemic disease. Physiologic calcifications in the basal ganglia bilaterally. No evidence of acute infarction, hemorrhage, hydrocephalus, extra-axial collection or new mass lesion/mass effect. Vascular: Numerous vascular calcifications. 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 Sinuses/Orbits: Chronic opacification of the sphenoid sinuses, similar to the prior study. No acute finding. Other: None. IMPRESSION: 1. No acute intracranial abnormalities. 2. Mild cerebral atrophy with chronic microvascular ischemic changes in the cerebral white matter, as above. 3. Similar appearance of invasive pituitary tumor, as above. Electronically Signed   By: Vinnie Langton M.D.   On: 09/06/2022 11:52    Medications Ordered in ED Medications  acetaminophen (TYLENOL) tablet 650 mg (has no administration  in time range)    Or  acetaminophen (TYLENOL) suppository 650 mg (has no administration in time range)  ondansetron (ZOFRAN) injection 4 mg (has no administration in time range)  latanoprost (XALATAN) 0.005 % ophthalmic solution 1 drop (has no administration in time range)  levothyroxine (SYNTHROID) tablet 112 mcg (has no administration in time range)  hydrocortisone (CORTEF) tablet 10 mg (has no administration in time range)    And  hydrocortisone (CORTEF) tablet 5 mg (has no administration in time range)  ondansetron (ZOFRAN) injection 4 mg (4 mg Intravenous Given 09/06/22 2324)  sodium chloride 0.9 % bolus 1,000 mL (1,000 mLs Intravenous New Bag/Given 09/06/22 2325)  ondansetron (ZOFRAN) injection 4 mg (4 mg Intravenous Given 09/07/22 0215)  fentaNYL (SUBLIMAZE) injection 50 mcg (50 mcg Intravenous Given 09/07/22 0215)  ondansetron (ZOFRAN) injection 4 mg (4 mg Intravenous Given 09/07/22 0403)  HYDROmorphone (DILAUDID) injection 0.5 mg (0.5 mg Intravenous Given 09/07/22 0403)  sodium chloride 0.9 % bolus 1,000 mL (1,000 mLs Intravenous New Bag/Given 09/07/22 0511)  desmopressin (DDAVP) injection 2 mcg (2 mcg Intravenous Given 09/07/22 0511)  Procedures .Critical Care  Performed by: Fatima Blank, MD Authorized by: Fatima Blank, MD   Critical care provider statement:    Critical care time (minutes):  80   Critical care time was exclusive of:  Separately billable procedures and treating other patients   Critical care was necessary to treat or prevent imminent or life-threatening deterioration of the following conditions:  Endocrine crisis and metabolic crisis   Critical care was time spent personally by me on the following activities:  Development of treatment plan with patient or surrogate, discussions with consultants, evaluation of  patient's response to treatment, examination of patient, obtaining history from patient or surrogate, review of old charts, re-evaluation of patient's condition, pulse oximetry, ordering and review of radiographic studies, ordering and review of laboratory studies and ordering and performing treatments and interventions   Care discussed with: admitting provider     (including critical care time)  Medical Decision Making / ED Course   Medical Decision Making Amount and/or Complexity of Data Reviewed External Data Reviewed: radiology.    Details: CT from earlier today showed unchanged invasive pituitary tumor. Labs: ordered. Decision-making details documented in ED Course. Radiology: ordered and independent interpretation performed. Decision-making details documented in ED Course. ECG/medicine tests: ordered and independent interpretation performed. Decision-making details documented in ED Course.  Risk Prescription drug management. Decision regarding hospitalization.    Patient presents after a fall.  Made a level 2 trauma by EMS due to being on Plavix.. Will obtain CT head and cervical spine to assess for evidence of ICH or cervical fracture. No other injuries noted on exam requiring imaging..  Patient's confusion seems to have resolved but is most likely related to hyponatremia.  This is also likely the etiology for his severe nausea.  Will obtain labs to assess for electrolyte or metabolic derangements.  We will also obtain serum osm.  We will rule out infectious source.  In the meantime we will provide patient with saline infusion and antiemetics.     Complexity of Data:   Cardiac Monitoring: Normal sinus rhythm with rates in the 50s to 60s. EKG with borderline ST segment elevation in inferior leads without reciprocal changes. Similar to last tracing  Laboratory Tests ordered listed below with my independent interpretation: CBC without leukocytosis.  Hemoglobin is  up. Metabolic panel confirmed hyponatremia 124.  No other electrolyte derangements.  No renal sufficiency. No biliary obstruction or pancreatitis. UA without evidence of infection.   Imaging Studies ordered listed below with my independent interpretation: CT head negative for ICH. CT cervical spine negative for fracture or dislocation.     ED Course:    Assessment, Add'l Intervention, and Reassessment: Fall on Plavix No significant injuries from the fall  Hyponatremia Acute on chronic. Patient provided with normal saline infusions.  Given a dose of desmopressin. He was treated symptomatically for his nausea and what he later complained of muscle cramps in his legs. Consulted hospitalist service and spoke with Dr. Velia Meyer. Patient was admitted to medicine for further work-up and management.       Final Clinical Impression(s) / ED Diagnoses Final diagnoses:  Hyponatremia syndrome           This chart was dictated using voice recognition software.  Despite best efforts to proofread,  errors can occur which can change the documentation meaning.    Fatima Blank, MD 09/07/22 [id number]

## 2022-09-06 NOTE — ED Provider Notes (Incomplete)
[facility] EMERGENCY DEPARTMENT Provider Note  CSN: [id number] Arrival date & time: 09/06/22 2313  Chief Complaint(s) Fall  HPI Alexander Yoder is a 88 y.o. male {Add pertinent medical, surgical, social history, OB history to HPI:1}    Fall    Past Medical History Past Medical History:  Diagnosis Date  . Amblyopia of eye, right   . Anxiety attack   . Cancer [facility])    prostate  . Exotropia of right eye   . Glaucoma   . Hyperlipidemia   . Hypothyroidism   . Pituitary tumor    There are no problems to display for this patient.  Home Medication(s) Prior to Admission medications   Medication Sig Start Date End Date Taking? Authorizing Provider  acetaminophen (TYLENOL) 500 MG tablet Take 500 mg by mouth every 6 (six) hours as needed.    [provider]  aspirin EC 81 MG tablet Take 81 mg by mouth daily.    [provider]  B Complex-C (B-COMPLEX WITH VITAMIN C) tablet Take 1 tablet by mouth daily.    [provider]  brimonidine (ALPHAGAN) 0.15 % ophthalmic solution Place 1 drop into both eyes 2 (two) times daily.     [provider]  Cholecalciferol (VITAMIN D) 50 MCG (2000 UT) CAPS Take 2,000 Units by mouth daily.    [provider]  clopidogrel (PLAVIX) 75 MG tablet Take 1 tablet by mouth daily. 03/29/21   [provider]  desmopressin (DDAVP) 0.2 MG tablet Take 0.2-0.4 mg by mouth See admin instructions. 2 tabs in the morning, and 1 tab in the evening    [provider]  hydrocortisone (CORTEF) 10 MG tablet Take 5-10 mg by mouth See admin instructions. 10 mg in the morning, 5 mg in the evening    [provider]  latanoprost (XALATAN) 0.005 % ophthalmic solution Place 1 drop into both eyes at bedtime.    [provider]  neomycin-polymyxin-dexameth (MAXITROL) 0.1 % OINT Place 1 application into the right eye 4 (four) times daily. For 1 week 09/24/19   Lamonte Sakai, MD   NEOMYCIN-POLYMYXIN-HYDROCORTISONE (CORTISPORIN) 1 % SOLN OTIC solution Apply 1-2 drops to toe BID after soaking 01/16/22   Hyatt, Max T, DPM  Omega-3 Fatty Acids (FISH OIL) 1000 MG CAPS Take 1,000 mg by mouth daily.    [provider]  ondansetron (ZOFRAN) 4 MG tablet Take 1 tablet (4 mg total) by mouth every 8 (eight) hours as needed for nausea or vomiting. 10/20/21   Carvel Getting, NP  oseltamivir (TAMIFLU) 75 MG capsule Take 1 capsule (75 mg total) by mouth every 12 (twelve) hours. 10/20/21   Carvel Getting, NP  rosuvastatin (CRESTOR) 20 MG tablet Take 5 mg by mouth every Monday, Wednesday, and Friday.     [provider]  SYNTHROID 112 MCG tablet Take 112 mcg by mouth daily before breakfast.  07/29/19   [provider]  terbinafine (LAMISIL) 250 MG tablet Take 1 tablet (250 mg total) by mouth daily. 04/26/22   Hyatt, Max T, DPM  terbinafine (LAMISIL) 250 MG tablet Take 1 tablet (250 mg total) by mouth daily. 08/28/22   Hyatt, Max T, DPM  testosterone cypionate (DEPOTESTOSTERONE CYPIONATE) 200 MG/ML injection SMARTSIG:0.8 Milliliter(s) IM Every 2 Weeks 10/31/20   [provider]  Allergies Peanut-containing drug products and Penicillins  Review of Systems Review of Systems As noted in HPI  Physical Exam Vital Signs  I have reviewed the triage vital signs BP (!) 157/72   Pulse (!) 54   Temp (!) 97 F (36.1 C) (Temporal)   Resp 14   Ht '5\' 10"'$  (1.778 m)   Wt 79 kg   BMI 24.99 kg/m  *** Physical Exam  ED Results and Treatments Labs (all labs ordered are listed, but only abnormal results are displayed) Labs Reviewed  COMPREHENSIVE METABOLIC PANEL  LIPASE, BLOOD  CBC WITH DIFFERENTIAL/PLATELET  URINALYSIS, ROUTINE W REFLEX MICROSCOPIC  NA AND K (SODIUM & POTASSIUM), RAND UR  OSMOLALITY, URINE  OSMOLALITY  CORTISOL   PROTIME-INR  I-STAT CHEM 8, ED                                                                                                                         EKG  EKG Interpretation  Date/Time:    Ventricular Rate:    PR Interval:    QRS Duration:   QT Interval:    QTC Calculation:   R Axis:     Text Interpretation:         Radiology CT HEAD WO CONTRAST (5MM)  Result Date: 09/06/2022 CLINICAL DATA:  87 year old male with history of confusion and difficulty speaking. Headaches. Dizziness. Memory loss. EXAM: CT HEAD WITHOUT CONTRAST TECHNIQUE: Contiguous axial images were obtained from the base of the skull through the vertex without intravenous contrast. RADIATION DOSE REDUCTION: This exam was performed according to the departmental dose-optimization program which includes automated exposure control, adjustment of the mA and/or kV according to patient size and/or use of iterative reconstruction technique. COMPARISON:  Head CT 03/28/2021. 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 Mild cerebral atrophy. Patchy and confluent areas of decreased attenuation are noted throughout the deep and periventricular white matter of the cerebral hemispheres bilaterally, compatible with chronic microvascular ischemic disease. Physiologic calcifications in the basal ganglia bilaterally. No evidence of acute infarction, hemorrhage, hydrocephalus, extra-axial collection or new mass lesion/mass effect. Vascular: Numerous vascular calcifications. 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 Sinuses/Orbits: Chronic opacification of the sphenoid sinuses, similar to the prior study. No acute  finding. Other: None. IMPRESSION: 1. No acute intracranial abnormalities. 2. Mild cerebral atrophy with chronic microvascular ischemic changes in the cerebral white matter, as above. 3. Similar appearance of invasive pituitary tumor, as above. Electronically Signed   By: Vinnie Langton M.D.   On: 09/06/2022 11:52    Medications Ordered in ED Medications  ondansetron (ZOFRAN) injection 4 mg (4 mg Intravenous Given 09/06/22 2324)  sodium chloride 0.9 % bolus 1,000 mL (1,000  mLs Intravenous New Bag/Given 09/06/22 2325)                                                                                                                                     Procedures Procedures  (including critical care time)  Medical Decision Making / ED Course   Medical Decision Making Amount and/or Complexity of Data Reviewed Labs: ordered. Radiology: ordered.  Risk Prescription drug management.          Final Clinical Impression(s) / ED Diagnoses Final diagnoses:  None    {Document critical care time when appropriate:1}  {Document review of labs and clinical decision tools ie heart score, Chads2Vasc2 etc:1}  {Document your independent review of radiology images, and any outside records:1} {Document your discussion with family members, caretakers, and with consultants:1} {Document social determinants of health affecting pt's care:1} {Document your decision making why or why not admission, treatments were needed:1} This chart was dictated using voice recognition software.  Despite best efforts to proofread,  errors can occur which can change the documentation meaning.

## 2022-09-07 ENCOUNTER — Encounter (HOSPITAL_COMMUNITY): Payer: Self-pay | Admitting: Internal Medicine

## 2022-09-07 ENCOUNTER — Inpatient Hospital Stay (HOSPITAL_COMMUNITY): Payer: Medicare Other

## 2022-09-07 DIAGNOSIS — Y92009 Unspecified place in unspecified non-institutional (private) residence as the place of occurrence of the external cause: Secondary | ICD-10-CM | POA: Diagnosis not present

## 2022-09-07 DIAGNOSIS — E785 Hyperlipidemia, unspecified: Secondary | ICD-10-CM | POA: Diagnosis present

## 2022-09-07 DIAGNOSIS — Z79890 Hormone replacement therapy (postmenopausal): Secondary | ICD-10-CM | POA: Diagnosis not present

## 2022-09-07 DIAGNOSIS — Z880 Allergy status to penicillin: Secondary | ICD-10-CM | POA: Diagnosis not present

## 2022-09-07 DIAGNOSIS — I509 Heart failure, unspecified: Secondary | ICD-10-CM | POA: Diagnosis not present

## 2022-09-07 DIAGNOSIS — R112 Nausea with vomiting, unspecified: Secondary | ICD-10-CM

## 2022-09-07 DIAGNOSIS — B353 Tinea pedis: Secondary | ICD-10-CM | POA: Diagnosis present

## 2022-09-07 DIAGNOSIS — E861 Hypovolemia: Secondary | ICD-10-CM | POA: Diagnosis present

## 2022-09-07 DIAGNOSIS — Z8042 Family history of malignant neoplasm of prostate: Secondary | ICD-10-CM | POA: Diagnosis not present

## 2022-09-07 DIAGNOSIS — I5032 Chronic diastolic (congestive) heart failure: Secondary | ICD-10-CM | POA: Diagnosis present

## 2022-09-07 DIAGNOSIS — Z91010 Allergy to peanuts: Secondary | ICD-10-CM | POA: Diagnosis not present

## 2022-09-07 DIAGNOSIS — W19XXXA Unspecified fall, initial encounter: Secondary | ICD-10-CM

## 2022-09-07 DIAGNOSIS — T38895A Adverse effect of other hormones and synthetic substitutes, initial encounter: Secondary | ICD-10-CM | POA: Diagnosis present

## 2022-09-07 DIAGNOSIS — E860 Dehydration: Secondary | ICD-10-CM | POA: Diagnosis present

## 2022-09-07 DIAGNOSIS — E039 Hypothyroidism, unspecified: Secondary | ICD-10-CM | POA: Diagnosis not present

## 2022-09-07 DIAGNOSIS — E038 Other specified hypothyroidism: Secondary | ICD-10-CM | POA: Diagnosis present

## 2022-09-07 DIAGNOSIS — E8779 Other fluid overload: Secondary | ICD-10-CM | POA: Diagnosis not present

## 2022-09-07 DIAGNOSIS — E871 Hypo-osmolality and hyponatremia: Secondary | ICD-10-CM | POA: Diagnosis present

## 2022-09-07 DIAGNOSIS — E2740 Unspecified adrenocortical insufficiency: Secondary | ICD-10-CM

## 2022-09-07 DIAGNOSIS — E230 Hypopituitarism: Secondary | ICD-10-CM

## 2022-09-07 DIAGNOSIS — W010XXA Fall on same level from slipping, tripping and stumbling without subsequent striking against object, initial encounter: Secondary | ICD-10-CM | POA: Diagnosis present

## 2022-09-07 DIAGNOSIS — E232 Diabetes insipidus: Secondary | ICD-10-CM

## 2022-09-07 DIAGNOSIS — Z79899 Other long term (current) drug therapy: Secondary | ICD-10-CM | POA: Diagnosis not present

## 2022-09-07 DIAGNOSIS — Z7902 Long term (current) use of antithrombotics/antiplatelets: Secondary | ICD-10-CM | POA: Diagnosis not present

## 2022-09-07 DIAGNOSIS — Z9079 Acquired absence of other genital organ(s): Secondary | ICD-10-CM | POA: Diagnosis not present

## 2022-09-07 DIAGNOSIS — Z7982 Long term (current) use of aspirin: Secondary | ICD-10-CM | POA: Diagnosis not present

## 2022-09-07 DIAGNOSIS — D751 Secondary polycythemia: Secondary | ICD-10-CM | POA: Diagnosis present

## 2022-09-07 DIAGNOSIS — Z8546 Personal history of malignant neoplasm of prostate: Secondary | ICD-10-CM | POA: Diagnosis not present

## 2022-09-07 LAB — COMPREHENSIVE METABOLIC PANEL
ALT: 29 U/L (ref 0–44)
ALT: 24 U/L (ref 0–44)
AST: 29 U/L (ref 15–41)
AST: 27 U/L (ref 15–41)
Albumin: 4.3 g/dL (ref 3.5–5.0)
Albumin: 3.8 g/dL (ref 3.5–5.0)
Alkaline Phosphatase: 69 U/L (ref 38–126)
Alkaline Phosphatase: 62 U/L (ref 38–126)
Anion gap: 12 (ref 5–15)
Anion gap: 8 (ref 5–15)
BUN: <5 mg/dL — ABNORMAL LOW (ref 8–23)
BUN: <5 mg/dL — ABNORMAL LOW (ref 8–23)
CO2: 20 mmol/L — ABNORMAL LOW (ref 22–32)
CO2: 23 mmol/L (ref 22–32)
Calcium: 9.3 mg/dL (ref 8.9–10.3)
Calcium: 8.3 mg/dL — ABNORMAL LOW (ref 8.9–10.3)
Chloride: 92 mmol/L — ABNORMAL LOW (ref 98–111)
Chloride: 94 mmol/L — ABNORMAL LOW (ref 98–111)
Creatinine, Ser: 0.96 mg/dL (ref 0.61–1.24)
Creatinine, Ser: 0.9 mg/dL (ref 0.61–1.24)
GFR, Estimated: >60 mL/min (ref >60)
GFR, Estimated: >60 mL/min (ref >60)
Glucose, Bld: 94 mg/dL (ref 70–99)
Glucose, Bld: 87 mg/dL (ref 70–99)
Potassium: 3.6 mmol/L (ref 3.5–5.1)
Potassium: 3.9 mmol/L (ref 3.5–5.1)
Sodium: 124 mmol/L — ABNORMAL LOW (ref 135–145)
Sodium: 125 mmol/L — ABNORMAL LOW (ref 135–145)
Total Bilirubin: 1 mg/dL (ref 0.3–1.2)
Total Bilirubin: 0.9 mg/dL (ref 0.3–1.2)
Total Protein: 7 g/dL (ref 6.5–8.1)
Total Protein: 6.3 g/dL — ABNORMAL LOW (ref 6.5–8.1)

## 2022-09-07 LAB — LIPASE, BLOOD: Lipase: 55 U/L — ABNORMAL HIGH (ref 11–51)

## 2022-09-07 LAB — CBC WITH DIFFERENTIAL/PLATELET
Abs Immature Granulocytes: 0.02 10*3/uL (ref 0.00–0.07)
Abs Immature Granulocytes: 0.04 10*3/uL (ref 0.00–0.07)
Basophils Absolute: 0.1 10*3/uL (ref 0.0–0.1)
Basophils Absolute: 0.1 10*3/uL (ref 0.0–0.1)
Basophils Relative: 1 %
Basophils Relative: 1 %
Eosinophils Absolute: 0.2 10*3/uL (ref 0.0–0.5)
Eosinophils Absolute: 0.2 10*3/uL (ref 0.0–0.5)
Eosinophils Relative: 3 %
Eosinophils Relative: 2 %
HCT: 49.7 % (ref 39.0–52.0)
HCT: 48.9 % (ref 39.0–52.0)
Hemoglobin: 18 g/dL — ABNORMAL HIGH (ref 13.0–17.0)
Hemoglobin: 16.8 g/dL (ref 13.0–17.0)
Immature Granulocytes: 0 %
Immature Granulocytes: 1 %
Lymphocytes Relative: 24 %
Lymphocytes Relative: 13 %
Lymphs Abs: 1.4 10*3/uL (ref 0.7–4.0)
Lymphs Abs: 1 10*3/uL (ref 0.7–4.0)
MCH: 29.6 pg (ref 26.0–34.0)
MCH: 28.8 pg (ref 26.0–34.0)
MCHC: 36.2 g/dL — ABNORMAL HIGH (ref 30.0–36.0)
MCHC: 34.4 g/dL (ref 30.0–36.0)
MCV: 81.7 fL (ref 80.0–100.0)
MCV: 83.9 fL (ref 80.0–100.0)
Monocytes Absolute: 0.8 10*3/uL (ref 0.1–1.0)
Monocytes Absolute: 0.8 10*3/uL (ref 0.1–1.0)
Monocytes Relative: 13 %
Monocytes Relative: 11 %
Neutro Abs: 3.4 10*3/uL (ref 1.7–7.7)
Neutro Abs: 5.4 10*3/uL (ref 1.7–7.7)
Neutrophils Relative %: 59 %
Neutrophils Relative %: 72 %
Platelets: 272 10*3/uL (ref 150–400)
Platelets: 259 10*3/uL (ref 150–400)
RBC: 6.08 MIL/uL — ABNORMAL HIGH (ref 4.22–5.81)
RBC: 5.83 MIL/uL — ABNORMAL HIGH (ref 4.22–5.81)
RDW: 12.8 % (ref 11.5–15.5)
RDW: 13 % (ref 11.5–15.5)
WBC: 5.8 10*3/uL (ref 4.0–10.5)
WBC: 7.4 10*3/uL (ref 4.0–10.5)
nRBC: 0 % (ref 0.0–0.2)
nRBC: 0 % (ref 0.0–0.2)

## 2022-09-07 LAB — TSH: TSH: 0.056 u[IU]/mL — ABNORMAL LOW (ref 0.350–4.500)

## 2022-09-07 LAB — URINALYSIS, ROUTINE W REFLEX MICROSCOPIC
Bacteria, UA: NONE SEEN
Bilirubin Urine: NEGATIVE
Glucose, UA: NEGATIVE mg/dL
Hgb urine dipstick: NEGATIVE
Ketones, ur: NEGATIVE mg/dL
Leukocytes,Ua: NEGATIVE
Nitrite: NEGATIVE
Protein, ur: NEGATIVE mg/dL
Specific Gravity, Urine: 1.009 (ref 1.005–1.030)
pH: 8 (ref 5.0–8.0)

## 2022-09-07 LAB — TROPONIN I (HIGH SENSITIVITY)
Troponin I (High Sensitivity): 6 ng/L (ref <18)
Troponin I (High Sensitivity): 6 ng/L (ref <18)

## 2022-09-07 LAB — T4, FREE: Free T4: 1.01 ng/dL (ref 0.61–1.12)

## 2022-09-07 LAB — PROTIME-INR
INR: 1 (ref 0.8–1.2)
Prothrombin Time: 13.5 seconds (ref 11.4–15.2)

## 2022-09-07 LAB — OSMOLALITY: Osmolality: 263 mOsm/kg — ABNORMAL LOW (ref 275–295)

## 2022-09-07 LAB — NA AND K (SODIUM & POTASSIUM), RAND UR
Potassium Urine: 29 mmol/L
Sodium, Ur: 150 mmol/L

## 2022-09-07 LAB — MAGNESIUM: Magnesium: 1.7 mg/dL (ref 1.7–2.4)

## 2022-09-07 LAB — URIC ACID: Uric Acid, Serum: 3.9 mg/dL (ref 3.7–8.6)

## 2022-09-07 LAB — BRAIN NATRIURETIC PEPTIDE: B Natriuretic Peptide: 131.4 pg/mL — ABNORMAL HIGH (ref 0.0–100.0)

## 2022-09-07 LAB — OSMOLALITY, URINE: Osmolality, Ur: 384 mOsm/kg (ref 300–900)

## 2022-09-07 LAB — CORTISOL: Cortisol, Plasma: 5 ug/dL

## 2022-09-07 MED ORDER — DESMOPRESSIN ACETATE 4 MCG/ML IJ SOLN
2.0000 ug | Freq: Once | INTRAMUSCULAR | Status: AC
  Administered 2022-09-07: 2 ug via INTRAVENOUS
  Filled 2022-09-07: qty 1

## 2022-09-07 MED ORDER — ONDANSETRON HCL 4 MG/2ML IJ SOLN
4.0000 mg | Freq: Once | INTRAMUSCULAR | Status: AC
  Administered 2022-09-07: 4 mg via INTRAVENOUS
  Filled 2022-09-07: qty 2

## 2022-09-07 MED ORDER — FENTANYL CITRATE PF 50 MCG/ML IJ SOSY
50.0000 ug | PREFILLED_SYRINGE | Freq: Once | INTRAMUSCULAR | Status: AC
  Administered 2022-09-07: 50 ug via INTRAVENOUS
  Filled 2022-09-07: qty 1

## 2022-09-07 MED ORDER — HYDROMORPHONE HCL 1 MG/ML IJ SOLN
0.5000 mg | Freq: Once | INTRAMUSCULAR | Status: AC
  Administered 2022-09-07: 0.5 mg via INTRAVENOUS
  Filled 2022-09-07: qty 1

## 2022-09-07 MED ORDER — LATANOPROST 0.005 % OP SOLN
1.0000 [drp] | Freq: Every day | OPHTHALMIC | Status: DC
  Administered 2022-09-07 – 2022-09-09 (×3): 1 [drp] via OPHTHALMIC
  Filled 2022-09-07 (×2): qty 2.5

## 2022-09-07 MED ORDER — HYDROCORTISONE 5 MG PO TABS: 5.0000 mg | ORAL_TABLET | ORAL | Status: DC

## 2022-09-07 MED ORDER — LEVOTHYROXINE SODIUM 112 MCG PO TABS
112.0000 ug | ORAL_TABLET | Freq: Every day | ORAL | Status: DC
  Administered 2022-09-07 – 2022-09-10 (×4): 112 ug via ORAL
  Filled 2022-09-07 (×5): qty 1

## 2022-09-07 MED ORDER — SODIUM CHLORIDE 0.9 % IV BOLUS
1000.0000 mL | Freq: Once | INTRAVENOUS | Status: AC
  Administered 2022-09-07: 1000 mL via INTRAVENOUS

## 2022-09-07 MED ORDER — ONDANSETRON HCL 4 MG/2ML IJ SOLN
4.0000 mg | Freq: Four times a day (QID) | INTRAMUSCULAR | Status: DC | PRN
  Administered 2022-09-07 – 2022-09-08 (×2): 4 mg via INTRAVENOUS
  Filled 2022-09-07 (×2): qty 2

## 2022-09-07 MED ORDER — ACETAMINOPHEN 325 MG PO TABS
650.0000 mg | ORAL_TABLET | Freq: Four times a day (QID) | ORAL | Status: DC | PRN
  Administered 2022-09-07 – 2022-09-09 (×3): 650 mg via ORAL
  Filled 2022-09-07 (×2): qty 2

## 2022-09-07 MED ORDER — HYDROCORTISONE 5 MG PO TABS
5.0000 mg | ORAL_TABLET | Freq: Every day | ORAL | Status: DC
  Administered 2022-09-07: 5 mg via ORAL
  Filled 2022-09-07 (×2): qty 1

## 2022-09-07 MED ORDER — ACETAMINOPHEN 650 MG RE SUPP: 650.0000 mg | Freq: Four times a day (QID) | RECTAL | Status: DC | PRN

## 2022-09-07 MED ORDER — HYDROCORTISONE 10 MG PO TABS
10.0000 mg | ORAL_TABLET | Freq: Every day | ORAL | Status: DC
  Administered 2022-09-07 – 2022-09-08 (×2): 10 mg via ORAL
  Filled 2022-09-07 (×3): qty 1

## 2022-09-07 NOTE — Progress Notes (Signed)
                                                Progress Note   This is a no charge note as patient was seen and admitted earlier today by Dr. Velia Meyer  patient was seen and examined, chart imaging and labs were reviewed. -Patient was admitted for fall, weakness, reports nausea and vomiting after starting p.o. Lamisil for fungal toe infection, his work-up was significant for dehydration, and hyponatremia, so far no further nausea and vomiting, tolerating oral intake.  Physical exam:  Awake Alert, Oriented X 3, No new F.N deficits, Normal affect Symmetrical Chest wall movement, Good air movement bilaterally, CTAB RRR,No Gallops,Rubs or new Murmurs, No Parasternal Heave +ve B.Sounds, Abd Soft, No tenderness, No rebound - guarding or rigidity. No Cyanosis, Clubbing or edema, No new Rash or bruise    Hyponatremia -Was likely due to nausea, vomiting and volume depletion, patient with history of hypopituitarism and diabetes insipidus, which is usually with hypernatremia, he did receive desmopressin initially. -For liberal fluid intake and will monitor closely.  Hypopituitarism -Continue with home meds  Phillips Climes MD

## 2022-09-07 NOTE — H&P (Addendum)
History and Physical    PLEASE NOTE THAT DRAGON DICTATION SOFTWARE WAS USED IN THE CONSTRUCTION OF THIS NOTE.   ART LEVAN YJE:[id number] DOB: [birth year] DOA: 09/06/2022  PCP: [facility], [place]  Patient coming from: home   I have personally briefly reviewed patient's old medical records in [facility]  Chief Complaint: fall  HPI: Alexander Yoder is a 88 y.o. 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   After starting terbinafine on 08/28/2022 for athlete's foot, the patient developed intermittent nausea resulting in 3-4 episodes of nonbloody, nonbilious emesis over the last 4 to 5 days, complicated by reduction in consumption of food and consumption of water over that timeframe.  Most recent episode of vomiting occurred on 09/06/2022.  Patient's family felt that the patient was slightly confused relative to baseline mental status over the last day, prompting the patient to present to his PCPs office on 09/06/2022 for further evaluation of such.  PCP ordered CT head, which showed no evidence of acute process, while noting chronic, unchanged pituitary tumor.   Subsequently, around 2200 on 09/06/2022, the patient experienced a ground-level fall while ambulating at home, conveying that he tripped and as a component of this fall, struck his head on the floor.  In the setting of carotid artery stenosis, he is on dual antiplatelet therapy with daily baby aspirin as well as Plavix.  Not on a formal anticoagulation.  No associated loss of consciousness.  He denies any associated recent chest pain, shortness of breath, palpitations, diaphoresis,  dizziness, presyncope, or syncope.  Reports mild ensuing headache following this fall, but denies any associated acute focal weakness, acute focal numbness, paresthesias, facial droop, slurred speech, expressive aphasia, acute change in vision, dysphagia, vertigo.  However, as the patient is on multiple antiplatelet medications at home, he was brought to [facility] emergency department for further evaluation management of aforementioned fall.  Denies any recent subjective fever, chills, rigors, or generalized myalgias.  No recent rash, neck stiffness, cough, dysuria, gross hematuria, or abdominal discomfort.  Per chart review most recent prior serum sodium value noted to be 136 on 04/26/2022.  It also appears that the patient was hospitalized in September 2022 for acute hyponatremia in the setting of presenting fall.  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  Medical history is notable for chronic diastolic heart failure, with most recent echocardiogram occurring in October 2018, which is notable for LVEF 60 to 63%, grade 1 diastolic dysfunction, mild regurgitation, normal right ventricular systolic function and mild tricuspid regurgitation.  On a scheduled diuretic medications at home, including no thiazide diuretics.     ED Course:  Vital signs in the ED were notable for the following: Afebrile, heart rate 78-58; systolic blood pressures in the 40s to 160s; respiratory rate 14-18, oxygen saturation 97 or percent on room air.  Labs were notable for the following: CMP notable for the following: Sodium 124, chloride 92,  At 20, anion gap 12, creatinine 0.96 compared to measures prior creatinine  data point 0.95  in June 2023, glucose 94, liver enzymes within normal limits.  High-sensitivity troponin I x1 send to be six-point CBC notable for will cell count 5800, hemoglobin 18 compared to most recent prior value of 18.1 in September 2022.  INR 1.0.  Urinalysis notable for no white blood cells and no bacteria, as well as leukocyte esterase/nitrate negative.  The following studies have been initiated, with results currently pending: TSH, free T4, random cortisol, urine sodium and random, serum osmolality, random urine osmolality.  Imaging and additional notable ED work-up: EKG, without any prior EKG available for point comparison, shows sinus rhythm, rate 62, prolonged PR, nonspecific T wave inversion in aVL, nonspecific less than 1 mm ST elevation in leads II, 3, aVF, V3 through V6, and no evidence of greater than 1 mm ST elevation.  Given that the patient hit his head in the interval since CT head was performed by PCP on 09/06/2022, EDP ordered repeat CT head, which showed no evidence of acute intracranial process, including no evidence of intracranial hemorrhage nor any evidence of acute infarct, will also showing chronic pituitary mass, with lesion measuring up to 2.1 cm, consistent with lesion seen on MRI brain from 10/09/2020.  Additionally, CT cervical spine showed no evidence of acute cervical fracture or subluxation injury.  While in the ED, the following were administered: Fentanyl 50 mcg IV x1, Dilaudid 0.5 mg IV x1, Zofran 4 mg IV x3 doses, normal send has 1 L bolus.  Subsequently, the patient was admitted for further evaluation management of acute hyponatremia in the setting of presenting ground-level fall.      Review of Systems: As per HPI otherwise 10 point review of systems negative.   Past Medical History:  Diagnosis Date   Amblyopia of eye, right    Anxiety attack    Cancer ([place])    prostate   Exotropia of right eye    Glaucoma    Hyperlipidemia    Hypothyroidism    Pituitary tumor      Past Surgical History:  Procedure Laterality Date   COLONOSCOPY     EYE SURGERY Bilateral    cataracts removed   PROSTATECTOMY  2000   STRABISMUS SURGERY Right 09/24/2019   Procedure: REPAIR STRABISMUS RIGHT EYE;  Surgeon: Lamonte Sakai, MD;  Location: [place];  Service: Ophthalmology;  Laterality: Right;   TONSILLECTOMY     TRANSPHENOIDAL / TRANSNASAL HYPOPHYSECTOMY / RESECTION PITUITARY TUMOR  1998, 2010   x2 -   WISDOM TOOTH EXTRACTION      Social History:  reports that he has never smoked. He has never used smokeless tobacco. He reports that he does not drink alcohol and does not use drugs.   Allergies  Allergen Reactions   Peanut-Containing Drug Products Anaphylaxis   Penicillins Other (See Comments)    Did it involve swelling of the face/tongue/throat, SOB, or low BP? Yes Did it involve sudden or severe rash/hives, skin peeling, or any reaction on the inside of your mouth or nose? Yes Did you need to seek medical attention at a hospital or doctor's office? Yes When did it last happen?     2010  If all above answers are "NO", may proceed with cephalosporin use.     History reviewed. No pertinent family history.  Family history reviewed and not pertinent    Prior to Admission medications   Medication Sig Start Date End Date Taking? Authorizing Provider  acetaminophen (TYLENOL) 500 MG tablet Take 500 mg by mouth  every 6 (six) hours as needed.    [provider]  aspirin EC 81 MG tablet Take 81 mg by mouth daily.    [provider]  B Complex-C (B-COMPLEX WITH VITAMIN C) tablet Take 1 tablet by mouth daily.    [provider]  brimonidine (ALPHAGAN) 0.15 % ophthalmic solution Place 1 drop into both eyes 2 (two) times daily.     [provider]  Cholecalciferol (VITAMIN D) 50 MCG (2000 UT) CAPS Take 2,000 Units by mouth daily.    [provider]  clopidogrel (PLAVIX) 75 MG tablet Take 1 tablet by mouth daily. 03/29/21    [provider]  desmopressin (DDAVP) 0.2 MG tablet Take 0.2-0.4 mg by mouth See admin instructions. 2 tabs in the morning, and 1 tab in the evening    [provider]  hydrocortisone (CORTEF) 10 MG tablet Take 5-10 mg by mouth See admin instructions. 10 mg in the morning, 5 mg in the evening    [provider]  latanoprost (XALATAN) 0.005 % ophthalmic solution Place 1 drop into both eyes at bedtime.    [provider]  neomycin-polymyxin-dexameth (MAXITROL) 0.1 % OINT Place 1 application into the right eye 4 (four) times daily. For 1 week 09/24/19   Lamonte Sakai, MD  NEOMYCIN-POLYMYXIN-HYDROCORTISONE (CORTISPORIN) 1 % SOLN OTIC solution Apply 1-2 drops to toe BID after soaking 01/16/22   Hyatt, Max T, DPM  Omega-3 Fatty Acids (FISH OIL) 1000 MG CAPS Take 1,000 mg by mouth daily.    [provider]  ondansetron (ZOFRAN) 4 MG tablet Take 1 tablet (4 mg total) by mouth every 8 (eight) hours as needed for nausea or vomiting. 10/20/21   Carvel Getting, NP  oseltamivir (TAMIFLU) 75 MG capsule Take 1 capsule (75 mg total) by mouth every 12 (twelve) hours. 10/20/21   Carvel Getting, NP  rosuvastatin (CRESTOR) 20 MG tablet Take 5 mg by mouth every Monday, Wednesday, and Friday.     [provider]  SYNTHROID 112 MCG tablet Take 112 mcg by mouth daily before breakfast.  07/29/19   [provider]  terbinafine (LAMISIL) 250 MG tablet Take 1 tablet (250 mg total) by mouth daily. 04/26/22   Hyatt, Max T, DPM  terbinafine (LAMISIL) 250 MG tablet Take 1 tablet (250 mg total) by mouth daily. 08/28/22   Hyatt, Max T, DPM  testosterone cypionate (DEPOTESTOSTERONE CYPIONATE) 200 MG/ML injection SMARTSIG:0.8 Milliliter(s) IM Every 2 Weeks 10/31/20   [provider]     Objective    Physical Exam: Vitals:   09/07/22 0230 09/07/22 0300 09/07/22 0330 09/07/22 0332  BP: (!) 149/72 (!) 157/82 (!) 156/94   Pulse: (!) 55 (!) 59 (!) 57   Resp: '14 13 16    '$ Temp:    (!) 97.4 F (36.3 C)  TempSrc:    Oral  SpO2: 99% 100% 97%   Weight:      Height:        General: appears to be stated age; alert, oriented Skin: warm, dry, no rash Head:  AT/[place] Mouth:  Oral mucosa membranes appear dry, normal dentition Neck: supple; trachea midline Heart:  RRR; did not appreciate any M/R/G Lungs: CTAB, did not appreciate any wheezes, rales, or rhonchi Abdomen: + BS; soft, ND, NT Vascular: 2+ pedal pulses b/l; 2+ radial pulses b/l Extremities: no peripheral edema, no muscle wasting Neuro: strength and sensation intact in upper and lower extremities b/l    Labs on Admission: I have  personally reviewed following labs and imaging studies  CBC: Recent Labs  Lab 09/06/22 2320 09/06/22 2335  WBC 5.8  --   NEUTROABS 3.4  --   HGB 18.0* 18.4*  HCT 49.7 54.0*  MCV 81.7  --   PLT 272  --    Basic Metabolic Panel: Recent Labs  Lab 09/06/22 2320 09/06/22 2335  NA 124* 124*  K 3.6 3.7  CL 92* 89*  CO2 20*  --   GLUCOSE 94 96  BUN <5* 5*  CREATININE 0.96 0.80  CALCIUM 9.3  --    GFR: Estimated Creatinine Clearance: 64.6 mL/min (by C-G formula based on SCr of 0.8 mg/dL). Liver Function Tests: Recent Labs  Lab 09/06/22 2320  AST 29  ALT 29  ALKPHOS 69  BILITOT 1.0  PROT 7.0  ALBUMIN 4.3   Recent Labs  Lab 09/06/22 2320  LIPASE 55*   No results for input(s): "AMMONIA" in the last 168 hours. Coagulation Profile: Recent Labs  Lab 09/06/22 2320  INR 1.0   Cardiac Enzymes: No results for input(s): "CKTOTAL", "CKMB", "CKMBINDEX", "TROPONINI" in the last 168 hours. BNP (last 3 results) No results for input(s): "PROBNP" in the last 8760 hours. HbA1C: No results for input(s): "HGBA1C" in the last 72 hours. CBG: No results for input(s): "GLUCAP" in the last 168 hours. Lipid Profile: No results for input(s): "CHOL", "HDL", "LDLCALC", "TRIG", "CHOLHDL", "LDLDIRECT" in the last 72 hours. Thyroid Function Tests: Recent Labs     09/06/22 2320  TSH 0.056*  FREET4 1.01   Anemia Panel: No results for input(s): "VITAMINB12", "FOLATE", "FERRITIN", "TIBC", "IRON", "RETICCTPCT" in the last 72 hours. Urine analysis:    Component Value Date/Time   COLORURINE STRAW (A) 09/07/2022 0052   APPEARANCEUR CLEAR 09/07/2022 0052   LABSPEC 1.009 09/07/2022 0052   PHURINE 8.0 09/07/2022 0052   GLUCOSEU NEGATIVE 09/07/2022 0052   HGBUR NEGATIVE 09/07/2022 0052   BILIRUBINUR NEGATIVE 09/07/2022 0052   KETONESUR NEGATIVE 09/07/2022 0052   PROTEINUR NEGATIVE 09/07/2022 0052   UROBILINOGEN 0.2 05/17/2009 0253   NITRITE NEGATIVE 09/07/2022 0052   LEUKOCYTESUR NEGATIVE 09/07/2022 0052    Radiological Exams on Admission: CT HEAD WO CONTRAST (5MM)  Result Date: 09/06/2022 CLINICAL DATA:  Head trauma, minor (Age >= 65y); Neck trauma (Age >= 65y) EXAM: CT HEAD WITHOUT CONTRAST CT CERVICAL SPINE WITHOUT CONTRAST TECHNIQUE: Multidetector CT imaging of the head and cervical spine was performed following the standard protocol without intravenous contrast. Multiplanar CT image reconstructions of the cervical spine were also generated. RADIATION DOSE REDUCTION: This exam was performed according to the departmental dose-optimization program which includes automated exposure control, adjustment of the mA and/or kV according to patient size and/or use of iterative reconstruction technique. COMPARISON:  None Available. FINDINGS: CT HEAD FINDINGS BRAIN: BRAIN Cerebral ventricle sizes are concordant with the degree of cerebral volume loss. Patchy and confluent areas of decreased attenuation are noted throughout the deep and periventricular white matter of the cerebral hemispheres bilaterally, compatible with chronic microvascular ischemic disease. No evidence of large-territorial acute infarction. No parenchymal hemorrhage. Redemonstration of an enlarged pituitary gland measuring up to 2.1 cm. No extra-axial collection. No mass effect or midline shift. No  hydrocephalus. Basilar cisterns are patent. Vascular: No hyperdense vessel. Atherosclerotic calcifications are present within the cavernous internal carotid and vertebral arteries. Skull: No acute fracture or focal lesion. Sinuses/Orbits: Paranasal sinuses and mastoid air cells are clear. Bilateral lens replacement. Otherwise the orbits are unremarkable. Other: None. CT CERVICAL SPINE FINDINGS Alignment: Normal.  Skull base and vertebrae: Multilevel degenerative changes of the spine. No associated severe osseous neural foraminal or central canal stenosis. No acute fracture. No aggressive appearing focal osseous lesion or focal pathologic process. Soft tissues and spinal canal: No prevertebral fluid or swelling. No visible canal hematoma. Upper chest: Unremarkable. Other: None. IMPRESSION: 1. No acute intracranial abnormality. 2. No acute displaced fracture or traumatic listhesis of the cervical spine. 3. Chronic pituitary mass lesion measuring up to 2.1 cm consistent with known lesion better evaluated on MRI head 10/09/2020. Electronically Signed   By: Iven Finn M.D.   On: 09/06/2022 23:57   CT Cervical Spine Wo Contrast  Result Date: 09/06/2022 CLINICAL DATA:  Head trauma, minor (Age >= 65y); Neck trauma (Age >= 65y) EXAM: CT HEAD WITHOUT CONTRAST CT CERVICAL SPINE WITHOUT CONTRAST TECHNIQUE: Multidetector CT imaging of the head and cervical spine was performed following the standard protocol without intravenous contrast. Multiplanar CT image reconstructions of the cervical spine were also generated. RADIATION DOSE REDUCTION: This exam was performed according to the departmental dose-optimization program which includes automated exposure control, adjustment of the mA and/or kV according to patient size and/or use of iterative reconstruction technique. COMPARISON:  None Available. FINDINGS: CT HEAD FINDINGS BRAIN: BRAIN Cerebral ventricle sizes are concordant with the degree of cerebral volume loss. Patchy  and confluent areas of decreased attenuation are noted throughout the deep and periventricular white matter of the cerebral hemispheres bilaterally, compatible with chronic microvascular ischemic disease. No evidence of large-territorial acute infarction. No parenchymal hemorrhage. Redemonstration of an enlarged pituitary gland measuring up to 2.1 cm. No extra-axial collection. No mass effect or midline shift. No hydrocephalus. Basilar cisterns are patent. Vascular: No hyperdense vessel. Atherosclerotic calcifications are present within the cavernous internal carotid and vertebral arteries. Skull: No acute fracture or focal lesion. Sinuses/Orbits: Paranasal sinuses and mastoid air cells are clear. Bilateral lens replacement. Otherwise the orbits are unremarkable. Other: None. CT CERVICAL SPINE FINDINGS Alignment: Normal. Skull base and vertebrae: Multilevel degenerative changes of the spine. No associated severe osseous neural foraminal or central canal stenosis. No acute fracture. No aggressive appearing focal osseous lesion or focal pathologic process. Soft tissues and spinal canal: No prevertebral fluid or swelling. No visible canal hematoma. Upper chest: Unremarkable. Other: None. IMPRESSION: 1. No acute intracranial abnormality. 2. No acute displaced fracture or traumatic listhesis of the cervical spine. 3. Chronic pituitary mass lesion measuring up to 2.1 cm consistent with known lesion better evaluated on MRI head 10/09/2020. Electronically Signed   By: Iven Finn M.D.   On: 09/06/2022 23:57   CT HEAD WO CONTRAST (5MM)  Result Date: 09/06/2022 CLINICAL DATA:  88 year old male with history of confusion and difficulty speaking. Headaches. Dizziness. Memory loss. EXAM: CT HEAD WITHOUT CONTRAST TECHNIQUE: Contiguous axial images were obtained from the base of the skull through the vertex without intravenous contrast. RADIATION DOSE REDUCTION: This exam was performed according to the departmental  dose-optimization program which includes automated exposure control, adjustment of the mA and/or kV according to patient size and/or use of iterative reconstruction technique. COMPARISON:  Head CT 03/28/2021. 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 Mild cerebral atrophy. Patchy and confluent areas of decreased attenuation are noted throughout the deep and periventricular white matter of the cerebral hemispheres bilaterally, compatible  with chronic microvascular ischemic disease. Physiologic calcifications in the basal ganglia bilaterally. No evidence of acute infarction, hemorrhage, hydrocephalus, extra-axial collection or new mass lesion/mass effect. Vascular: Numerous vascular calcifications. 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 Sinuses/Orbits: Chronic opacification of the sphenoid sinuses, similar to the prior study. No acute finding. Other: None. IMPRESSION: 1. No acute intracranial abnormalities. 2. Mild cerebral atrophy with chronic microvascular ischemic changes in the cerebral white matter, as above. 3. Similar appearance of invasive pituitary tumor, as above. Electronically Signed   By: Vinnie Langton M.D.   On: 09/06/2022 11:52     EKG: Independently reviewed, with result as described above.    Assessment/Plan   Principal Problem:   Acute hyponatremia Active Problems:   Fall at home, initial encounter   Nausea & vomiting   Hypothyroidism   Panhypopituitarism ([place])   Adrenal insufficiency ([place])   Diabetes insipidus ([place])     #) Acute hypo-osmolar  hyponatremia: Presenting serum sodium 124 compared to most recent prior value of 136 in June 2023.  Suspect an element of hypovolumeia, with suspected contribution from dehydration in the setting of clinical evidence of such as well as report of recent development of nausea/vomiting, potentially from recent initiation of terbinafine, noting that nausea/vomiting or some of the more common side effects of this medication.   However, differential also includes the possibility of SIADH/cerebral salt wasting syndrome given prolonged mechanical fall that was associate with hitting his head earlier in the day.  As the patient is already received a liter of LR in the ED, will refrain from provision of additional IV fluids for now, pending further evaluation of underlying contributing factors leading to presenting acute hyponatremia, will closely monitoring considering trend in serum sodium, closely monitoring for any evidence of rapid correction.   Differential for the patient's acute hyponatremia also includes attribution from hypothyroidism given history of secondary hypothyroidism, as well as potential contribution from desmopressin therapy in the context of history of diabetes insipidus.  Consequently, we will hold additional desmopressin for now.  Of note, he also has a documented history of secondary adrenal insufficiency as a consequence of his panhypopituitarism, on chronic hydrocortisone therapy.  We will follow for result of random cortisol level to further assess adequacy of current dosing of hydrocortisone.  No additional pharmacologic contributions identified at this time, including a thiazide diuretics nor any antidepressants.   Of note, presentation does not appear to be associated any acute focal neurologic deficits, patient is noted to be alert and oriented at this time, mom noting that he is hard of hearing, which appears to be baseline for him.   Plan: monitor strict I's and O's and daily weights.  Follow-up for results of random urine sodium, urine osmolality.  Follow-up for result serum  osmolality to confirm suspected hypoosmolar etiology.  Follow-up result of TSH/free T4, random cortisol level.  Add on random urine creatinine.  Repeat CMP in the morning, closely monitoring for interval trend in serum sodium level.  Have also ordered repeat BMP to be checked at noon today.   Check serum uric acid level, as SIADH can be associated with hypouricemia due to hyperuricuria.  Hold home desmopressin for now.  Resume home hydrocortisone and Synthroid.  Add on BNP.  Chest x-ray.  Prn IV Zofran for nausea.         #) Ground-level mechanical fall: Patient presents with ground-level fall, which appears to be  mechanical in nature, in which she did hit his head as a component of this fall while on dual antiplatelet therapy, as above.  Suspect that the patient was already developing acute hyponatremia as a consequence of recent nausea/vomiting, increasing his risk for ensuing fall, which may have potentially further exacerbated his serum sodium level, as further detailed above.  No additional organic contributing factors leading to his presenting mechanical fall, including no evidence of underlying infectious process, including urinalysis that was inconsistent with UTI.  Plan: Further evaluation management presenting acute hyponatremia.  Fall precautions ordered.  Physical therapy consult placed for the morning.  Check TSH.  Repeat CMP/CBC in the morning.  Chest x-ray.          #) Panhypopituitarism secondary to pituitary tumor: Documented history of such, complicated by central diabetes insipidus for which she is on desmopressin, and also complicated by secondary adrenal insufficiency for sedation/chronic hydrocortisone therapy as well as complicated by secondary hypothyroidism.  As noted above, presenting CT head continues to demonstrate chronic pituitary mass, similar appearance relative to that seen on MRI from November 2021.  Plan: Continue current home doses of Synthroid and  hydrocortisone, with close monitoring for results of TSH/free T4 as well as random cortisol level, as above.  Holding home desmopressin  for now in the setting of acute hyponatremia.  Monitor strict I's and O's.  Further evaluation management of acute hyponatremia, as above.            #) Chronic diastolic heart failure: documented history of such, with most recent echocardiogram performed in October 2018, which is notable for LVEF 66 5% as well as grade 1 diastolic dysfunction with additional details as conveyed above. No clinical evidence to suggest acutely decompensated heart failure at this time.  However, given presenting acute hyponatremia, will also check chest x-ray and add on BNP.  Of note, not on a scheduled diuretic medications at home.  Plan: monitor strict I's & O's and daily weights. Repeat CMP in AM. Check serum mag level.  Chest x-ray.  Add on BNP, as above.     DVT prophylaxis: SCD's   Code Status: Full code Family Communication: none Disposition Plan: Per Rounding Team Consults called: none;  Admission status: Inpatient    PLEASE NOTE THAT DRAGON DICTATION SOFTWARE WAS USED IN THE CONSTRUCTION OF THIS NOTE.   [place] DO [facility]  From [place]   09/07/2022, 5:23 AM

## 2022-09-07 NOTE — ED Notes (Signed)
ED TO INPATIENT HANDOFF REPORT    S Name/Age/Gender Alexander Yoder 88 y.o. male Room/Bed: 010C/010C  Code Status   Code Status: Full Code  Home/SNF/Other Home Patient oriented to: self, place, time, and situation Is this baseline? Yes   Triage Complete: Triage complete  Chief Complaint Acute hyponatremia [E87.1]  Triage Note Pt BIB [facility] from home, pt had unwitnessed fall, is on a blood thinner. Pt seen by his PCP this morning for confusion, was found to have low sodium. Denies pain, c/o nausea. EMS VS: BP 162/92, SpO2 100% room air, HR 56   Allergies Allergies  Allergen Reactions   Peanut-Containing Drug Products Anaphylaxis   Penicillins Other (See Comments)    Did it involve swelling of the face/tongue/throat, SOB, or low BP? Yes Did it involve sudden or severe rash/hives, skin peeling, or any reaction on the inside of your mouth or nose? Yes Did you need to seek medical attention at a hospital or doctor's office? Yes When did it last happen?     2010  If all above answers are "NO", may proceed with cephalosporin use.     Level of Care/Admitting Diagnosis ED Disposition     ED Disposition  Admit   Condition  --   [place]: [facility] [[id number]]  Level of Care: Telemetry Medical [104]  May admit patient to [facility] or [facility] if equivalent level of care is available:: No  Covid Evaluation: Asymptomatic - no recent exposure (last 10 days) testing not required  Diagnosis: Acute hyponatremia [254270]  Admitting Physician: Rhetta Mura [[id number]]  Attending Physician: Rhetta Mura [[id number]]  Certification:: I certify this patient will need inpatient services for at least 2 midnights  Estimated Length of Stay: 2          B Medical/Surgery History Past Medical History:  Diagnosis Date   Amblyopia of eye, right    Anxiety attack    Cancer ([place])    prostate   Exotropia of right eye    Glaucoma     Hyperlipidemia    Hypothyroidism    Pituitary tumor    Past Surgical History:  Procedure Laterality Date   COLONOSCOPY     EYE SURGERY Bilateral    cataracts removed   PROSTATECTOMY  2000   STRABISMUS SURGERY Right 09/24/2019   Procedure: REPAIR STRABISMUS RIGHT EYE;  Surgeon: Lamonte Sakai, MD;  Location: [place];  Service: Ophthalmology;  Laterality: Right;   TONSILLECTOMY     TRANSPHENOIDAL / TRANSNASAL HYPOPHYSECTOMY / RESECTION PITUITARY TUMOR  1998, 2010   x2 -   WISDOM TOOTH EXTRACTION       A IV Location/Drains/Wounds Patient Lines/Drains/Airways Status     Active Line/Drains/Airways     Name Placement date Placement time Site Days   Peripheral IV 09/06/22 18 G Anterior;Distal;Left Forearm 09/06/22  2316  Forearm  1   Incision (Closed) 09/24/19 Eye Right 09/24/19  0801  -- 1079            Intake/Output Last 24 hours  Intake/Output Summary (Last 24 hours) at 09/07/2022 1825 Last data filed at 09/07/2022 0704 Gross per 24 hour  Intake --  Output 1300 ml  Net -1300 ml    Labs/Imaging Results for orders placed or performed during the hospital encounter of 09/06/22 (from the past 48 hour(s))  Comprehensive metabolic panel     Status: Abnormal   Collection Time: 09/06/22 11:20 PM  Result Value Ref Range   Sodium  124 (L) 135 - 145 mmol/L   Potassium 3.6 3.5 - 5.1 mmol/L   Chloride 92 (L) 98 - 111 mmol/L   CO2 20 (L) 22 - 32 mmol/L   Glucose, Bld 94 70 - 99 mg/dL    Comment: Glucose reference range applies only to samples taken after fasting for at least 8 hours.   BUN <5 (L) 8 - 23 mg/dL   Creatinine, Ser 0.96 0.61 - 1.24 mg/dL   Calcium 9.3 8.9 - 10.3 mg/dL   Total Protein 7.0 6.5 - 8.1 g/dL   Albumin 4.3 3.5 - 5.0 g/dL   AST 29 15 - 41 U/L   ALT 29 0 - 44 U/L   Alkaline Phosphatase 69 38 - 126 U/L   Total Bilirubin 1.0 0.3 - 1.2 mg/dL   GFR, Estimated >60 >60 mL/min    Comment: (NOTE) Calculated using the CKD-EPI Creatinine Equation (2021)    Anion  gap 12 5 - 15    Comment: Performed at [facility] [address] [place], [place] [address]  Lipase, blood     Status: Abnormal   Collection Time: 09/06/22 11:20 PM  Result Value Ref Range   Lipase 55 (H) 11 - 51 U/L    Comment: Performed at [facility], [place] [address], [place], [place] [address]  CBC with Diff     Status: Abnormal   Collection Time: 09/06/22 11:20 PM  Result Value Ref Range   WBC 5.8 4.0 - 10.5 K/uL   RBC 6.08 (H) 4.22 - 5.81 MIL/uL   Hemoglobin 18.0 (H) 13.0 - 17.0 g/dL   HCT 49.7 39.0 - 52.0 %   MCV 81.7 80.0 - 100.0 fL   MCH 29.6 26.0 - 34.0 pg   MCHC 36.2 (H) 30.0 - 36.0 g/dL   RDW 12.8 11.5 - 15.5 %   Platelets 272 150 - 400 K/uL   nRBC 0.0 0.0 - 0.2 %   Neutrophils Relative % 59 %   Neutro Abs 3.4 1.7 - 7.7 K/uL   Lymphocytes Relative 24 %   Lymphs Abs 1.4 0.7 - 4.0 K/uL   Monocytes Relative 13 %   Monocytes Absolute 0.8 0.1 - 1.0 K/uL   Eosinophils Relative 3 %   Eosinophils Absolute 0.2 0.0 - 0.5 K/uL   Basophils Relative 1 %   Basophils Absolute 0.1 0.0 - 0.1 K/uL   Immature Granulocytes 0 %   Abs Immature Granulocytes 0.02 0.00 - 0.07 K/uL    Comment: Performed at [facility], [address], [place], [place] [address]  Osmolality     Status: Abnormal   Collection Time: 09/06/22 11:20 PM  Result Value Ref Range   Osmolality 263 (L) 275 - 295 mOsm/kg    Comment: REPEATED TO VERIFY Performed at [facility], [place] [place], [place] [address]   Protime-INR     Status: None   Collection Time: 09/06/22 11:20 PM  Result Value Ref Range   Prothrombin Time 13.5 11.4 - 15.2 seconds   INR 1.0 0.8 - 1.2    Comment: (NOTE) INR goal varies based on device and disease states. Performed at [facility], [place] [address] [place], [place] [address]   T4, free     Status: None   Collection Time: 09/06/22 11:20 PM  Result Value Ref Range   Free T4 1.01 0.61 - 1.12 ng/dL    Comment: (NOTE) Biotin ingestion  may interfere with free T4  tests. If the results are inconsistent with the TSH level, previous test results, or the clinical presentation, then consider biotin interference. If needed, order repeat testing after stopping biotin. Performed at [facility], [place] [address], [place], [place] [address]   Cortisol     Status: None   Collection Time: 09/06/22 11:20 PM  Result Value Ref Range   Cortisol, Plasma 5.0 ug/dL    Comment: (NOTE) AM    6.7 - 22.6 ug/dL PM   <10.0       ug/dL Performed at [facility] [address] [place], [place] [address]   TSH     Status: Abnormal   Collection Time: 09/06/22 11:20 PM  Result Value Ref Range   TSH 0.056 (L) 0.350 - 4.500 uIU/mL    Comment: Performed by a 3rd Generation assay with a functional sensitivity of <=0.01 uIU/mL. Performed at [facility], [place] [address] [place], [place] [address]   I-Stat Chem 8, ED     Status: Abnormal   Collection Time: 09/06/22 11:35 PM  Result Value Ref Range   Sodium 124 (L) 135 - 145 mmol/L   Potassium 3.7 3.5 - 5.1 mmol/L   Chloride 89 (L) 98 - 111 mmol/L   BUN 5 (L) 8 - 23 mg/dL   Creatinine, Ser 0.80 0.61 - 1.24 mg/dL   Glucose, Bld 96 70 - 99 mg/dL    Comment: Glucose reference range applies only to samples taken after fasting for at least 8 hours.   Calcium, Ion 1.11 (L) 1.15 - 1.40 mmol/L   TCO2 23 22 - 32 mmol/L   Hemoglobin 18.4 (H) 13.0 - 17.0 g/dL   HCT 54.0 (H) 39.0 - 52.0 %  Urinalysis, Routine w reflex microscopic     Status: Abnormal   Collection Time: 09/07/22 12:52 AM  Result Value Ref Range   Color, Urine STRAW (A) YELLOW   APPearance CLEAR CLEAR   Specific Gravity, Urine 1.009 1.005 - 1.030   pH 8.0 5.0 - 8.0   Glucose, UA NEGATIVE NEGATIVE mg/dL   Hgb urine dipstick NEGATIVE NEGATIVE   Bilirubin Urine NEGATIVE NEGATIVE   Ketones, ur NEGATIVE NEGATIVE mg/dL   Protein, ur NEGATIVE NEGATIVE mg/dL   Nitrite NEGATIVE NEGATIVE   Leukocytes,Ua NEGATIVE NEGATIVE    RBC / HPF 0-5 0 - 5 RBC/hpf   WBC, UA 0-5 0 - 5 WBC/hpf   Bacteria, UA NONE SEEN NONE SEEN    Comment: Performed at [facility] [address], [place], [place] [address]  Na and K (sodium & potassium), rand urine     Status: None   Collection Time: 09/07/22 12:52 AM  Result Value Ref Range   Sodium, Ur 150 mmol/L   Potassium Urine 29 mmol/L    Comment: Performed at [facility] [address] [place], [place] [address]  Osmolality, urine     Status: None   Collection Time: 09/07/22 12:52 AM  Result Value Ref Range   Osmolality, Ur 384 300 - 900 mOsm/kg    Comment: REPEATED TO VERIFY Performed at [facility], [place], [place] [address]   Troponin I (High Sensitivity)     Status: None   Collection Time: 09/07/22  2:55 AM  Result Value Ref Range   Troponin I (High Sensitivity) 6 <18 ng/L    Comment: (NOTE) Elevated high sensitivity troponin I (hsTnI) values and significant  changes across serial measurements may suggest ACS  but many other  chronic and acute conditions are known to elevate hsTnI results.  Refer to the "Links" section for chest pain algorithms and additional  guidance. Performed at [facility], [place] [address], [place], [place] [address]   Troponin I (High Sensitivity)     Status: None   Collection Time: 09/07/22  5:54 AM  Result Value Ref Range   Troponin I (High Sensitivity) 6 <18 ng/L    Comment: (NOTE) Elevated high sensitivity troponin I (hsTnI) values and significant  changes across serial measurements may suggest ACS but many other  chronic and acute conditions are known to elevate hsTnI results.  Refer to the "Links" section for chest pain algorithms and additional  guidance. Performed at [facility], [place] [address], [place], [place] [address]   Magnesium     Status: None   Collection Time: 09/07/22  5:54 AM  Result Value Ref Range   Magnesium 1.7 1.7 - 2.4 mg/dL    Comment: Performed at  [facility] [address], [place], [place] [address]  Uric acid     Status: None   Collection Time: 09/07/22  5:54 AM  Result Value Ref Range   Uric Acid, Serum 3.9 3.7 - 8.6 mg/dL    Comment: HEMOLYSIS AT THIS LEVEL MAY AFFECT RESULT Performed at [facility] [address], [place], [place] [address]   Brain natriuretic peptide     Status: Abnormal   Collection Time: 09/07/22  5:54 AM  Result Value Ref Range   B Natriuretic Peptide 131.4 (H) 0.0 - 100.0 pg/mL    Comment: Performed at [facility] [address], [place], [place] [address]  CBC with Differential/Platelet     Status: Abnormal   Collection Time: 09/07/22  9:19 AM  Result Value Ref Range   WBC 7.4 4.0 - 10.5 K/uL   RBC 5.83 (H) 4.22 - 5.81 MIL/uL   Hemoglobin 16.8 13.0 - 17.0 g/dL   HCT 48.9 39.0 - 52.0 %   MCV 83.9 80.0 - 100.0 fL   MCH 28.8 26.0 - 34.0 pg   MCHC 34.4 30.0 - 36.0 g/dL   RDW 13.0 11.5 - 15.5 %   Platelets 259 150 - 400 K/uL   nRBC 0.0 0.0 - 0.2 %   Neutrophils Relative % 72 %   Neutro Abs 5.4 1.7 - 7.7 K/uL   Lymphocytes Relative 13 %   Lymphs Abs 1.0 0.7 - 4.0 K/uL   Monocytes Relative 11 %   Monocytes Absolute 0.8 0.1 - 1.0 K/uL   Eosinophils Relative 2 %   Eosinophils Absolute 0.2 0.0 - 0.5 K/uL   Basophils Relative 1 %   Basophils Absolute 0.1 0.0 - 0.1 K/uL   Immature Granulocytes 1 %   Abs Immature Granulocytes 0.04 0.00 - 0.07 K/uL    Comment: Performed at [facility] [address] [place], [place] [address]  Comprehensive metabolic panel     Status: Abnormal   Collection Time: 09/07/22  9:19 AM  Result Value Ref Range   Sodium 125 (L) 135 - 145 mmol/L   Potassium 3.9 3.5 - 5.1 mmol/L   Chloride 94 (L) 98 - 111 mmol/L   CO2 23 22 - 32 mmol/L   Glucose, Bld 87 70 - 99 mg/dL    Comment: Glucose reference range applies only to samples taken after fasting for at least 8 hours.   BUN <5 (L) 8 - 23  mg/dL   Creatinine, Ser 0.90 0.61 - 1.24 mg/dL    Calcium 8.3 (L) 8.9 - 10.3 mg/dL   Total Protein 6.3 (L) 6.5 - 8.1 g/dL   Albumin 3.8 3.5 - 5.0 g/dL   AST 27 15 - 41 U/L   ALT 24 0 - 44 U/L   Alkaline Phosphatase 62 38 - 126 U/L   Total Bilirubin 0.9 0.3 - 1.2 mg/dL   GFR, Estimated >60 >60 mL/min    Comment: (NOTE) Calculated using the CKD-EPI Creatinine Equation (2021)    Anion gap 8 5 - 15    Comment: Performed at [facility] [address] [place],  [address]   DG Chest Port 1 View  Result Date: 09/07/2022 CLINICAL DATA:  Acute hyponatremia EXAM: PORTABLE CHEST 1 VIEW COMPARISON:  05/31/2022 FINDINGS: Artifact from EKG leads. Normal heart size and mediastinal contours. No acute infiltrate or edema. No effusion or pneumothorax. No acute osseous findings. IMPRESSION: Negative portable chest. Electronically Signed   By: Jorje Guild M.D.   On: 09/07/2022 07:53   CT HEAD WO CONTRAST (5MM)  Result Date: 09/06/2022 CLINICAL DATA:  Head trauma, minor (Age >= 65y); Neck trauma (Age >= 65y) EXAM: CT HEAD WITHOUT CONTRAST CT CERVICAL SPINE WITHOUT CONTRAST TECHNIQUE: Multidetector CT imaging of the head and cervical spine was performed following the standard protocol without intravenous contrast. Multiplanar CT image reconstructions of the cervical spine were also generated. RADIATION DOSE REDUCTION: This exam was performed according to the departmental dose-optimization program which includes automated exposure control, adjustment of the mA and/or kV according to patient size and/or use of iterative reconstruction technique. COMPARISON:  None Available. FINDINGS: CT HEAD FINDINGS BRAIN: BRAIN Cerebral ventricle sizes are concordant with the degree of cerebral volume loss. Patchy and confluent areas of decreased attenuation are noted throughout the deep and periventricular white matter of the cerebral hemispheres bilaterally, compatible with chronic microvascular ischemic disease. No evidence of large-territorial acute  infarction. No parenchymal hemorrhage. Redemonstration of an enlarged pituitary gland measuring up to 2.1 cm. No extra-axial collection. No mass effect or midline shift. No hydrocephalus. Basilar cisterns are patent. Vascular: No hyperdense vessel. Atherosclerotic calcifications are present within the cavernous internal carotid and vertebral arteries. Skull: No acute fracture or focal lesion. Sinuses/Orbits: Paranasal sinuses and mastoid air cells are clear. Bilateral lens replacement. Otherwise the orbits are unremarkable. Other: None. CT CERVICAL SPINE FINDINGS Alignment: Normal. Skull base and vertebrae: Multilevel degenerative changes of the spine. No associated severe osseous neural foraminal or central canal stenosis. No acute fracture. No aggressive appearing focal osseous lesion or focal pathologic process. Soft tissues and spinal canal: No prevertebral fluid or swelling. No visible canal hematoma. Upper chest: Unremarkable. Other: None. IMPRESSION: 1. No acute intracranial abnormality. 2. No acute displaced fracture or traumatic listhesis of the cervical spine. 3. Chronic pituitary mass lesion measuring up to 2.1 cm consistent with known lesion better evaluated on MRI head 10/09/2020. Electronically Signed   By: Iven Finn M.D.   On: 09/06/2022 23:57   CT Cervical Spine Wo Contrast  Result Date: 09/06/2022 CLINICAL DATA:  Head trauma, minor (Age >= 65y); Neck trauma (Age >= 65y) EXAM: CT HEAD WITHOUT CONTRAST CT CERVICAL SPINE WITHOUT CONTRAST TECHNIQUE: Multidetector CT imaging of the head and cervical spine was performed following the standard protocol without intravenous contrast. Multiplanar CT image reconstructions of the cervical spine were also generated. RADIATION DOSE REDUCTION: This exam was performed according to the departmental dose-optimization program which includes automated  exposure control, adjustment of the mA and/or kV according to patient size and/or use of iterative  reconstruction technique. COMPARISON:  None Available. FINDINGS: CT HEAD FINDINGS BRAIN: BRAIN Cerebral ventricle sizes are concordant with the degree of cerebral volume loss. Patchy and confluent areas of decreased attenuation are noted throughout the deep and periventricular white matter of the cerebral hemispheres bilaterally, compatible with chronic microvascular ischemic disease. No evidence of large-territorial acute infarction. No parenchymal hemorrhage. Redemonstration of an enlarged pituitary gland measuring up to 2.1 cm. No extra-axial collection. No mass effect or midline shift. No hydrocephalus. Basilar cisterns are patent. Vascular: No hyperdense vessel. Atherosclerotic calcifications are present within the cavernous internal carotid and vertebral arteries. Skull: No acute fracture or focal lesion. Sinuses/Orbits: Paranasal sinuses and mastoid air cells are clear. Bilateral lens replacement. Otherwise the orbits are unremarkable. Other: None. CT CERVICAL SPINE FINDINGS Alignment: Normal. Skull base and vertebrae: Multilevel degenerative changes of the spine. No associated severe osseous neural foraminal or central canal stenosis. No acute fracture. No aggressive appearing focal osseous lesion or focal pathologic process. Soft tissues and spinal canal: No prevertebral fluid or swelling. No visible canal hematoma. Upper chest: Unremarkable. Other: None. IMPRESSION: 1. No acute intracranial abnormality. 2. No acute displaced fracture or traumatic listhesis of the cervical spine. 3. Chronic pituitary mass lesion measuring up to 2.1 cm consistent with known lesion better evaluated on MRI head 10/09/2020. Electronically Signed   By: Iven Finn M.D.   On: 09/06/2022 23:57   CT HEAD WO CONTRAST (5MM)  Result Date: 09/06/2022 CLINICAL DATA:  88 year old male with history of confusion and difficulty speaking. Headaches. Dizziness. Memory loss. EXAM: CT HEAD WITHOUT CONTRAST TECHNIQUE: Contiguous axial  images were obtained from the base of the skull through the vertex without intravenous contrast. RADIATION DOSE REDUCTION: This exam was performed according to the departmental dose-optimization program which includes automated exposure control, adjustment of the mA and/or kV according to patient size and/or use of iterative reconstruction technique. COMPARISON:  Head CT 03/28/2021. 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 Mild cerebral atrophy. Patchy and confluent areas of decreased attenuation are noted throughout the deep and periventricular white matter of the cerebral hemispheres bilaterally, compatible with chronic microvascular ischemic disease. Physiologic calcifications in the basal ganglia bilaterally. No evidence of acute infarction, hemorrhage, hydrocephalus, extra-axial collection or new mass lesion/mass effect. Vascular: Numerous vascular calcifications. 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 Sinuses/Orbits: Chronic opacification of the sphenoid sinuses, similar to the prior study. No acute finding. Other: None. IMPRESSION: 1. No acute intracranial abnormalities. 2. Mild cerebral atrophy with chronic microvascular ischemic changes in the cerebral white matter, as above. 3. Similar appearance of invasive pituitary tumor, as above. Electronically Signed   By: Vinnie Langton M.D.   On: 09/06/2022 11:52    Pending Labs Unresulted Labs (From admission, onward)     Start     Ordered   09/08/22 0500  CBC  Tomorrow morning,   R        09/07/22 1613   09/08/22 7622  Basic metabolic panel  Tomorrow morning,   R        09/07/22 1613   09/07/22 6333  Basic metabolic  panel  Once-Timed,   TIMED        09/07/22 0443   09/07/22 0508  Creatinine, urine, random  Add-on,   AD        09/07/22 0508            Vitals/Pain Today's Vitals   09/07/22 1221 09/07/22 1530 09/07/22 1715 09/07/22 1717  BP:  118/77 (!) 166/95   Pulse:  61 61   Resp:  17 11   Temp:    98.3 F (36.8 C)  TempSrc:    Oral  SpO2:  99% 100%   Weight:      Height:      PainSc: 0-No pain       Isolation Precautions No active isolations  Medications Medications  acetaminophen (TYLENOL) tablet 650 mg (650 mg Oral Given 09/07/22 0927)    Or  acetaminophen (TYLENOL) suppository 650 mg ( Rectal See Alternative 09/07/22 0927)  ondansetron (ZOFRAN) injection 4 mg (4 mg Intravenous Given 09/07/22 0927)  latanoprost (XALATAN) 0.005 % ophthalmic solution 1 drop (has no administration in time range)  levothyroxine (SYNTHROID) tablet 112 mcg (112 mcg Oral Given 09/07/22 0927)  hydrocortisone (CORTEF) tablet 10 mg (10 mg Oral Given 09/07/22 1219)    And  hydrocortisone (CORTEF) tablet 5 mg (has no administration in time range)  ondansetron (ZOFRAN) injection 4 mg (4 mg Intravenous Given 09/06/22 2324)  sodium chloride 0.9 % bolus 1,000 mL (0 mLs Intravenous Stopped 09/07/22 0906)  ondansetron (ZOFRAN) injection 4 mg (4 mg Intravenous Given 09/07/22 0215)  fentaNYL (SUBLIMAZE) injection 50 mcg (50 mcg Intravenous Given 09/07/22 0215)  ondansetron (ZOFRAN) injection 4 mg (4 mg Intravenous Given 09/07/22 0403)  HYDROmorphone (DILAUDID) injection 0.5 mg (0.5 mg Intravenous Given 09/07/22 0403)  sodium chloride 0.9 % bolus 1,000 mL (0 mLs Intravenous Stopped 09/07/22 0704)  desmopressin (DDAVP) injection 2 mcg (2 mcg Intravenous Given 09/07/22 0511)    Mobility walks with device Low fall risk     R Recommendations: See Admitting Provider Note  Report given to:

## 2022-09-08 DIAGNOSIS — E2740 Unspecified adrenocortical insufficiency: Secondary | ICD-10-CM | POA: Diagnosis not present

## 2022-09-08 DIAGNOSIS — E871 Hypo-osmolality and hyponatremia: Secondary | ICD-10-CM | POA: Diagnosis not present

## 2022-09-08 DIAGNOSIS — E232 Diabetes insipidus: Secondary | ICD-10-CM | POA: Diagnosis not present

## 2022-09-08 DIAGNOSIS — W19XXXA Unspecified fall, initial encounter: Secondary | ICD-10-CM | POA: Diagnosis not present

## 2022-09-08 LAB — BASIC METABOLIC PANEL
Anion gap: 10 (ref 5–15)
BUN: 6 mg/dL — ABNORMAL LOW (ref 8–23)
CO2: 19 mmol/L — ABNORMAL LOW (ref 22–32)
Calcium: 8.8 mg/dL — ABNORMAL LOW (ref 8.9–10.3)
Chloride: 91 mmol/L — ABNORMAL LOW (ref 98–111)
Creatinine, Ser: 0.86 mg/dL (ref 0.61–1.24)
GFR, Estimated: >60 mL/min (ref >60)
Glucose, Bld: 88 mg/dL (ref 70–99)
Potassium: 4.2 mmol/L (ref 3.5–5.1)
Sodium: 120 mmol/L — ABNORMAL LOW (ref 135–145)

## 2022-09-08 LAB — SODIUM
Sodium: 124 mmol/L — ABNORMAL LOW (ref 135–145)
Sodium: 126 mmol/L — ABNORMAL LOW (ref 135–145)
Sodium: 124 mmol/L — ABNORMAL LOW (ref 135–145)
Sodium: 125 mmol/L — ABNORMAL LOW (ref 135–145)

## 2022-09-08 LAB — CBC
HCT: 47.3 % (ref 39.0–52.0)
Hemoglobin: 16.9 g/dL (ref 13.0–17.0)
MCH: 28.7 pg (ref 26.0–34.0)
MCHC: 35.7 g/dL (ref 30.0–36.0)
MCV: 80.3 fL (ref 80.0–100.0)
Platelets: 257 10*3/uL (ref 150–400)
RBC: 5.89 MIL/uL — ABNORMAL HIGH (ref 4.22–5.81)
RDW: 12.5 % (ref 11.5–15.5)
WBC: 5.5 10*3/uL (ref 4.0–10.5)
nRBC: 0 % (ref 0.0–0.2)

## 2022-09-08 LAB — CORTISOL: Cortisol, Plasma: 4 ug/dL

## 2022-09-08 LAB — SODIUM, URINE, RANDOM: Sodium, Ur: 31 mmol/L

## 2022-09-08 MED ORDER — SODIUM CHLORIDE 0.9 % IV SOLN: INTRAVENOUS | Status: DC

## 2022-09-08 MED ORDER — VITAMIN D 25 MCG (1000 UNIT) PO TABS
2000.0000 [IU] | ORAL_TABLET | Freq: Every day | ORAL | Status: DC
  Administered 2022-09-08 – 2022-09-10 (×3): 2000 [IU] via ORAL
  Filled 2022-09-08 (×5): qty 2

## 2022-09-08 MED ORDER — HYDROCORTISONE 10 MG PO TABS
10.0000 mg | ORAL_TABLET | Freq: Every day | ORAL | Status: DC
  Administered 2022-09-08 – 2022-09-09 (×2): 10 mg via ORAL
  Filled 2022-09-08 (×3): qty 1

## 2022-09-08 MED ORDER — HYDROCORTISONE 20 MG PO TABS
20.0000 mg | ORAL_TABLET | Freq: Every day | ORAL | Status: DC
  Administered 2022-09-09 – 2022-09-10 (×2): 20 mg via ORAL
  Filled 2022-09-08 (×2): qty 1

## 2022-09-08 MED ORDER — ASPIRIN 81 MG PO TBEC
81.0000 mg | DELAYED_RELEASE_TABLET | Freq: Every day | ORAL | Status: DC
  Administered 2022-09-08 – 2022-09-10 (×3): 81 mg via ORAL
  Filled 2022-09-08 (×3): qty 1

## 2022-09-08 MED ORDER — MELATONIN 5 MG PO TABS
5.0000 mg | ORAL_TABLET | Freq: Once | ORAL | Status: AC
  Administered 2022-09-08: 5 mg via ORAL
  Filled 2022-09-08: qty 1

## 2022-09-08 MED ORDER — ENOXAPARIN SODIUM 40 MG/0.4ML IJ SOSY
40.0000 mg | PREFILLED_SYRINGE | INTRAMUSCULAR | Status: DC
  Administered 2022-09-08 – 2022-09-09 (×2): 40 mg via SUBCUTANEOUS
  Filled 2022-09-08 (×2): qty 0.4

## 2022-09-08 MED ORDER — PROCHLORPERAZINE EDISYLATE 10 MG/2ML IJ SOLN
10.0000 mg | Freq: Once | INTRAMUSCULAR | Status: AC
  Administered 2022-09-08: 10 mg via INTRAVENOUS
  Filled 2022-09-08: qty 2

## 2022-09-08 MED ORDER — HYDROCORTISONE SOD SUC (PF) 100 MG IJ SOLR
100.0000 mg | Freq: Once | INTRAMUSCULAR | Status: AC
  Administered 2022-09-08: 100 mg via INTRAVENOUS
  Filled 2022-09-08: qty 2

## 2022-09-08 MED ORDER — BRIMONIDINE TARTRATE 0.15 % OP SOLN
1.0000 [drp] | Freq: Two times a day (BID) | OPHTHALMIC | Status: DC
  Administered 2022-09-08 – 2022-09-10 (×4): 1 [drp] via OPHTHALMIC
  Filled 2022-09-08: qty 5

## 2022-09-08 NOTE — Progress Notes (Addendum)
Vomited once, gave PRN med. Very anxious keep asking why he cannot eat and why he keep vomiting. Educate that it probably his electrolytes is low but once it normalize he will feel better. MD notified if she can order anti anxiety med. Awaiting order. Melatonin given

## 2022-09-08 NOTE — Progress Notes (Signed)
PROGRESS NOTE    [name]  ZOX:[id number] DOB: Dec 08, 1929 DOA: 09/06/2022 PCP: Pa, Palmer   Chief Complaint  Patient presents with   Fall    Brief Narrative:  Alexander Yoder is a 88 y.o. 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   Assessment & Plan:   Principal Problem:   Acute hyponatremia Active Problems:   Fall at home, initial encounter   Nausea & vomiting   Hypothyroidism   Panhypopituitarism ([place])   Adrenal insufficiency ([place])   Diabetes insipidus ([place])  Hyponatremia Diabetes insipidus -Initially hyponatremia most likely related to poor oral intake, nausea or vomiting, received DDAVP by ED initially. -Sodium down to 140 yesterday, but this morning back to 124, renal input greatly appreciated. -Avoid rapid overcorrection, goal of sodium 131 x 6 a.m. tomorrow. -Sinew with NS at 50 cc/h. -We will monitor sodium every 4 hours, will give DD AVP at 1 mcg if overcorrect sodium.  Panhypopituitarism Hypothyroidism adrenal insufficiency Central diabetes insipidus -On chronic cortisol supplements, cortisol a.m. level  at 4(unclear how significant this value in the setting of known panhypopituitarism), but I will give 1 stress dose hydrocortisone of 100 mg IV once given patient presentation with fall, weakness fatigue nausea and poor appetite. -TSH level is significantly low at 0.056(TSH value is unhelpful to panhypopituitarism/secondary hypothyroidism), free T4 is 1.01 which is appropriately in the upper normal range. -Patient on IM testosterone supplement every 2 weeks, which should be continued as an  outpatient -Patient on DDAVP at home, continue to hold meds.  Fall -Mechanical, continue with PT/OT  Chronic diastolic CHF -Appears to be hypovolemic, continue with IV NS.  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 -We will continue to hold oral  DVT prophylaxis: lovenox Code Status: Full Family Communication: none at bedside Disposition:   Status is: Inpatient  Consultants:  Renal   Subjective:  Patient does report nausea, poor appetite, generalized weakness and fatigue  Objective: Vitals:   09/07/22 1910 09/07/22 2343 09/08/22 0409 09/08/22 0411  BP: (!) 153/75 (!) 146/79 135/85   Pulse: 63 66 66   Resp: '16 17 19   '$ Temp: 98.3 F (36.8 C) 97.7 F (36.5 C) 97.8 F (36.6 C)   TempSrc: Oral Oral Oral   SpO2: 99% 96% 100%   Weight:    81.6 kg  Height:        Intake/Output Summary (Last 24 hours) at 09/08/2022 1420 Last data filed at 09/08/2022 1214 Gross per 24 hour  Intake 200 ml  Output 2550 ml  Net -2350 ml   Filed Weights   09/06/22 2324 09/08/22 0411  Weight: 79 kg 81.6 kg    Examination:  Awake Alert, Oriented X 3, No new F.N deficits, Normal affect Symmetrical Chest wall movement, Good air movement bilaterally, CTAB RRR,No Gallops,Rubs or new Murmurs, No Parasternal Heave +ve B.Sounds, Abd Soft, No tenderness, No rebound - guarding or rigidity. No Cyanosis, Clubbing or edema, No new Rash or bruise      Data Reviewed: I have personally reviewed following labs and imaging studies  CBC: Recent Labs  Lab 09/06/22 2320 09/06/22 2335 09/07/22 0919 09/08/22 0252  WBC 5.8  --  7.4 5.5  NEUTROABS 3.4  --  5.4  --  HGB 18.0* 18.4* 16.8 16.9  HCT 49.7 54.0* 48.9 47.3  MCV 81.7  --  83.9 80.3  PLT 272  --  259 657    Basic Metabolic Panel: Recent Labs  Lab 09/06/22 2320 09/06/22 2335 09/07/22 0554 09/07/22 0919 09/08/22 0252 09/08/22 0558 09/08/22 1006  NA 124* 124*  --  125* 120* 124* 126*  K 3.6 3.7  --  3.9 4.2  --   --   CL 92* 89*  --  94* 91*  --   --    CO2 20*  --   --  23 19*  --   --   GLUCOSE 94 96  --  87 88  --   --   BUN <5* 5*  --  <5* 6*  --   --   CREATININE 0.96 0.80  --  0.90 0.86  --   --   CALCIUM 9.3  --   --  8.3* 8.8*  --   --   MG  --   --  1.7  --   --   --   --     GFR: Estimated Creatinine Clearance: 60.1 mL/min (by C-G formula based on SCr of 0.86 mg/dL).  Liver Function Tests: Recent Labs  Lab 09/06/22 2320 09/07/22 0919  AST 29 27  ALT 29 24  ALKPHOS 69 62  BILITOT 1.0 0.9  PROT 7.0 6.3*  ALBUMIN 4.3 3.8    CBG: No results for input(s): "GLUCAP" in the last 168 hours.   No results found for this or any previous visit (from the past 240 hour(s)).       Radiology Studies: DG Chest Port 1 View  Result Date: 09/07/2022 CLINICAL DATA:  Acute hyponatremia EXAM: PORTABLE CHEST 1 VIEW COMPARISON:  05/31/2022 FINDINGS: Artifact from EKG leads. Normal heart size and mediastinal contours. No acute infiltrate or edema. No effusion or pneumothorax. No acute osseous findings. IMPRESSION: Negative portable chest. Electronically Signed   By: Jorje Guild M.D.   On: 09/07/2022 07:53   CT HEAD WO CONTRAST (5MM)  Result Date: 09/06/2022 CLINICAL DATA:  Head trauma, minor (Age >= 65y); Neck trauma (Age >= 65y) EXAM: CT HEAD WITHOUT CONTRAST CT CERVICAL SPINE WITHOUT CONTRAST TECHNIQUE: Multidetector CT imaging of the head and cervical spine was performed following the standard protocol without intravenous contrast. Multiplanar CT image reconstructions of the cervical spine were also generated. RADIATION DOSE REDUCTION: This exam was performed according to the departmental dose-optimization program which includes automated exposure control, adjustment of the mA and/or kV according to patient size and/or use of iterative reconstruction technique. COMPARISON:  None Available. FINDINGS: CT HEAD FINDINGS BRAIN: BRAIN Cerebral ventricle sizes are concordant with the degree of cerebral volume loss. Patchy and confluent  areas of decreased attenuation are noted throughout the deep and periventricular white matter of the cerebral hemispheres bilaterally, compatible with chronic microvascular ischemic disease. No evidence of large-territorial acute infarction. No parenchymal hemorrhage. Redemonstration of an enlarged pituitary gland measuring up to 2.1 cm. No extra-axial collection. No mass effect or midline shift. No hydrocephalus. Basilar cisterns are patent. Vascular: No hyperdense vessel. Atherosclerotic calcifications are present within the cavernous internal carotid and vertebral arteries. Skull: No acute fracture or focal lesion. Sinuses/Orbits: Paranasal sinuses and mastoid air cells are clear. Bilateral lens replacement. Otherwise the orbits are unremarkable. Other: None. CT CERVICAL SPINE FINDINGS Alignment: Normal. Skull base and vertebrae: Multilevel degenerative changes of the spine. No associated severe osseous neural foraminal or central canal  stenosis. No acute fracture. No aggressive appearing focal osseous lesion or focal pathologic process. Soft tissues and spinal canal: No prevertebral fluid or swelling. No visible canal hematoma. Upper chest: Unremarkable. Other: None. IMPRESSION: 1. No acute intracranial abnormality. 2. No acute displaced fracture or traumatic listhesis of the cervical spine. 3. Chronic pituitary mass lesion measuring up to 2.1 cm consistent with known lesion better evaluated on MRI head 10/09/2020. Electronically Signed   By: Iven Finn M.D.   On: 09/06/2022 23:57   CT Cervical Spine Wo Contrast  Result Date: 09/06/2022 CLINICAL DATA:  Head trauma, minor (Age >= 65y); Neck trauma (Age >= 65y) EXAM: CT HEAD WITHOUT CONTRAST CT CERVICAL SPINE WITHOUT CONTRAST TECHNIQUE: Multidetector CT imaging of the head and cervical spine was performed following the standard protocol without intravenous contrast. Multiplanar CT image reconstructions of the cervical spine were also generated. RADIATION  DOSE REDUCTION: This exam was performed according to the departmental dose-optimization program which includes automated exposure control, adjustment of the mA and/or kV according to patient size and/or use of iterative reconstruction technique. COMPARISON:  None Available. FINDINGS: CT HEAD FINDINGS BRAIN: BRAIN Cerebral ventricle sizes are concordant with the degree of cerebral volume loss. Patchy and confluent areas of decreased attenuation are noted throughout the deep and periventricular white matter of the cerebral hemispheres bilaterally, compatible with chronic microvascular ischemic disease. No evidence of large-territorial acute infarction. No parenchymal hemorrhage. Redemonstration of an enlarged pituitary gland measuring up to 2.1 cm. No extra-axial collection. No mass effect or midline shift. No hydrocephalus. Basilar cisterns are patent. Vascular: No hyperdense vessel. Atherosclerotic calcifications are present within the cavernous internal carotid and vertebral arteries. Skull: No acute fracture or focal lesion. Sinuses/Orbits: Paranasal sinuses and mastoid air cells are clear. Bilateral lens replacement. Otherwise the orbits are unremarkable. Other: None. CT CERVICAL SPINE FINDINGS Alignment: Normal. Skull base and vertebrae: Multilevel degenerative changes of the spine. No associated severe osseous neural foraminal or central canal stenosis. No acute fracture. No aggressive appearing focal osseous lesion or focal pathologic process. Soft tissues and spinal canal: No prevertebral fluid or swelling. No visible canal hematoma. Upper chest: Unremarkable. Other: None. IMPRESSION: 1. No acute intracranial abnormality. 2. No acute displaced fracture or traumatic listhesis of the cervical spine. 3. Chronic pituitary mass lesion measuring up to 2.1 cm consistent with known lesion better evaluated on MRI head 10/09/2020. Electronically Signed   By: Iven Finn M.D.   On: 09/06/2022 23:57         Scheduled Meds:  hydrocortisone  10 mg Oral Q breakfast   And   hydrocortisone  5 mg Oral QAC supper   latanoprost  1 drop Both Eyes QHS   levothyroxine  112 mcg Oral QAC breakfast   Continuous Infusions:  sodium chloride 50 mL/hr at 09/08/22 0531     LOS: 1 day      Phillips Climes, MD [facility]   To contact the attending provider between 7A-7P or the covering provider during after hours 7P-7A, please log into the web site [email or web address] and access using universal [place] password for that web site. If you do not have the password, please call the hospital operator.  09/08/2022, 2:20 PM

## 2022-09-08 NOTE — Evaluation (Signed)
Physical Therapy Evaluation Patient Details Name: Alexander Yoder MRN: [id number] DOB: [birth year] Today's Date: 09/08/2022  History of Present Illness  88 y/o male presented to ED on 09/06/22 after fall and had been seen by PCP earlier that morning for confusion and found to have low sodium. CT head negative. PMH: pituitary tumor, CHF, glaucome, prostate cancer, diabetes insipidus  Clinical Impression  Patient admitted with the above. PTA, patient lives with wife whom he cares for and uses SPC intermittently. Patient presents with weakness, impaired balance, and decreased activity tolerance. Patient ambulating at supervision level with use of IV pole. Encouraged use of SPC for mobility due to current fall risk, patient in agreement. Patient will benefit from skilled PT services during acute stay to address listed deficits. Recommend OPPT at discharge to address strength and balance deficits.        Recommendations for follow up therapy are one component of a multi-disciplinary discharge planning process, led by the attending physician.  Recommendations may be updated based on patient status, additional functional criteria and insurance authorization.  Follow Up Recommendations Outpatient PT      Assistance Recommended at Discharge Intermittent Supervision/Assistance  Patient can return home with the following  A little help with walking and/or transfers;A little help with bathing/dressing/bathroom;Assistance with cooking/housework;Direct supervision/assist for medications management;Direct supervision/assist for financial management    Equipment Recommendations None recommended by PT  Recommendations for Other Services       Functional Status Assessment Patient has had a recent decline in their functional status and demonstrates the ability to make significant improvements in function in a reasonable and predictable amount of time.     Precautions / Restrictions  Precautions Precautions: Fall Restrictions Weight Bearing Restrictions: No      Mobility  Bed Mobility Overal bed mobility: Needs Assistance Bed Mobility: Supine to Sit, Sit to Supine     Supine to sit: Supervision Sit to supine: Supervision        Transfers Overall transfer level: Needs assistance Equipment used: None Transfers: Sit to/from Stand Sit to Stand: Min guard           General transfer comment: min guard for safety. Initially unsteady but able to self correct    Ambulation/Gait Ambulation/Gait assistance: Min guard, Supervision Gait Distance (Feet): 400 Feet Assistive device: IV Pole Gait Pattern/deviations: Step-through pattern, Decreased stride length, Drifts right/left Gait velocity: decreased     General Gait Details: drifting throughout ambulation. Min guard initially but progressed to supervision with IV pole  Stairs            Wheelchair Mobility    Modified Rankin (Stroke Patients Only)       Balance Overall balance assessment: Mild deficits observed, not formally tested, History of Falls                                           Pertinent Vitals/Pain Pain Assessment Pain Assessment: No/denies pain    Home Living Family/patient expects to be discharged to:: Private residence Living Arrangements: Spouse/significant other   Type of Home: Other(Comment) (condo) Home Access: Elevator       Home Layout: One level Home Equipment: Cane - single point      Prior Function Prior Level of Function : Independent/Modified Independent             Mobility Comments: cares for wife who is in w/c  Hand Dominance        Extremity/Trunk Assessment   Upper Extremity Assessment Upper Extremity Assessment: Generalized weakness    Lower Extremity Assessment Lower Extremity Assessment: Generalized weakness    Cervical / Trunk Assessment Cervical / Trunk Assessment: Kyphotic  Communication    Communication: HOH  Cognition Arousal/Alertness: Awake/alert Behavior During Therapy: WFL for tasks assessed/performed Overall Cognitive Status: No family/caregiver present to determine baseline cognitive functioning                                 General Comments: seems generally confused but difficult to discern due to [facility] and no family present        General Comments      Exercises     Assessment/Plan    PT Assessment Patient needs continued PT services  PT Problem List Decreased strength;Decreased activity tolerance;Decreased balance;Decreased mobility;Decreased cognition;Decreased knowledge of use of DME;Decreased safety awareness;Decreased knowledge of precautions       PT Treatment Interventions DME instruction;Gait training;Functional mobility training;Therapeutic activities;Therapeutic exercise;Balance training;Patient/family education    PT Goals (Current goals can be found in the Care Plan section)  Acute Rehab PT Goals Patient Stated Goal: to get some rest PT Goal Formulation: With patient Time For Goal Achievement: 09/22/22 Potential to Achieve Goals: Fair    Frequency Min 3X/week     Co-evaluation               AM-PAC PT "6 Clicks" Mobility  Outcome Measure Help needed turning from your back to your side while in a flat bed without using bedrails?: A Little Help needed moving from lying on your back to sitting on the side of a flat bed without using bedrails?: A Little Help needed moving to and from a bed to a chair (including a wheelchair)?: A Little Help needed standing up from a chair using your arms (e.g., wheelchair or bedside chair)?: A Little Help needed to walk in hospital room?: A Little Help needed climbing 3-5 steps with a railing? : A Little 6 Click Score: 18    End of Session Equipment Utilized During Treatment: Gait belt Activity Tolerance: Patient tolerated treatment well Patient left: in bed;with call bell/phone  within reach;with bed alarm set Nurse Communication: Mobility status PT Visit Diagnosis: Muscle weakness (generalized) (M62.81);Unsteadiness on feet (R26.81);History of falling (Z91.81)    Time: 7893-8101 PT Time Calculation (min) (ACUTE ONLY): 16 min   Charges:   PT Evaluation $PT Eval Low Complexity: 1 Low          Stefanie Hodgens A. Gilford Rile PT, DPT Acute Rehabilitation Services Office [phone]   Linna Hoff 09/08/2022, 4:21 PM

## 2022-09-08 NOTE — Progress Notes (Signed)
Na 120, Md on call notified, see new order.

## 2022-09-08 NOTE — Progress Notes (Signed)
       CROSS COVER NOTE  NAME: DEMARYIUS IMRAN MRN: [id number] DOB : Jun 24, 1930    Date of Service   09/08/2022   HPI/Events of Note   Notified by RN of sodium 120.  Patient has been nauseous and had 1 episode of emesis.  No other acute changes endorsed by RN.   Patient was admitted for hyponatremia with sodium level 124.  Patient previously taking desmopressin for diabetes insipidus.  Per previous notes, desmopressin is being held.  Patient currently on no IVF due to previous bolus given in ED, however patient is now experiencing N/V. will be started on gentle normal saline at 50 cc/h.  Continue trending sodium level.   Interventions/ Plan   Normal saline started Sodium level trending.  Monitor for rapid increase in sodium level.       Raenette Rover, DNP, [place]

## 2022-09-08 NOTE — Consult Note (Signed)
Nephrology Consult   Requesting provider: Phillips Climes Service requesting consult: Hospitalist Reason for consult: Hyponatremia   Assessment/Recommendations: Alexander Yoder is a/an 88 y.o. male with a past medical history panhypopit, CHF, HLD who present w/ hyponatremia   Hyponatremia: Likely with mild symptoms.  Likely iatrogenic from DDAVP.  Would continue to hold this medication at this time.  Agree with normal saline 50 cc/h.  We will have to monitor closely for overcorrection.  Goal sodium is 131 by 6 AM on 10/22 -Cont w/ Na checks every 3-6 hours -Restart ddavp at 48mg if patient over corrects Na -Continue IVFs w/ NS at 50 cc/hr -Will obtain coritsol and consider higher dose of cortef in case adrenal insufficiency contributing  Volume Status: Appears near euvolemic to me. Cont w/ IVFs as ordered  Fall: Sounds mechanical.  Hyponatremia likely contributed.  Management as above  Panhypopituitarism: Continue home medications except for desmopressin.  Hypothyroidism: Consider decreasing replacement given low TSH  Metabolic Acidosis: bicarb slightly low at 19. CTM   Recommendations conveyed to primary service.    [facility] 09/08/2022 12:03 PM   _____________________________________________________________________________________ CC: Hyponatremia  History of Present Illness: Alexander MILNERis a/an 88y.o. male with a past medical history of panhypopit, CHF, HLD who presents after a fall.  Patient presented to the hospital yesterday after he had a fall.  He had been receiving therapy for a fungal infection in his foot and describes some nausea and emesis for the past 5 days prior to arrival.  It does sound like the patient was able to take most of his medications.  Family also noted that the patient had been slightly confused for several days.  Patient had a CT scan of his head prior to arriving which demonstrated no change to his  pituitary tumor.  The patient states he tripped when he fell and did not lose consciousness.  He denies significant fevers, chills, shortness of breath, chest pain, difficulties urinating.  He states that he has had problems with low sodium in the past.  His endocrinologist recommended decreasing his home desmopressin from 243m to 37m69mbut he had not done this yet.   The patient's sodium was 124 on arrival.  Did get as low as 120 this morning but back up to 124.  Patient did receive 1 dose of desmopressin 2mc34mt 5 am on 10/20. No longer having confusion. Mild nausea at this time. TSH notably low on 10/19 at 0.056.   Medications:  Current Facility-Administered Medications  Medication Dose Route Frequency Provider Last Rate Last Admin   0.9 %  sodium chloride infusion   Intravenous Continuous ChavRaenette Rover 50 mL/hr at 09/08/22 0531 New Bag at 09/08/22 0531   acetaminophen (TYLENOL) tablet 650 mg  650 mg Oral Q6H PRN Howerter, Justin B, DO   650 mg at 09/07/22 [id number]   acetaminophen (TYLENOL) suppository 650 mg  650 mg Rectal Q6H PRN Howerter, Justin B, DO       hydrocortisone (CORTEF) tablet 10 mg  10 mg Oral Q breakfast Howerter, Justin B, DO   10 mg at 09/08/22 [id number]   hydrocortisone (CORTEF) tablet 5 mg  5 mg Oral QAC supper Howerter, Justin B, DO   5 mg at 09/07/22 1848   latanoprost (XALATAN) 0.005 % ophthalmic solution 1 drop  1 drop Both Eyes QHS Howerter, Justin B, DO   1 drop at 09/07/22 2103   levothyroxine (SYNTHROID) tablet  112 mcg  112 mcg Oral QAC breakfast Howerter, Justin B, DO   112 mcg at 09/08/22 0816   ondansetron (ZOFRAN) injection 4 mg  4 mg Intravenous Q6H PRN Howerter, Justin B, DO   4 mg at 09/08/22 0128     ALLERGIES Peanut-containing drug products and Penicillins  MEDICAL HISTORY Past Medical History:  Diagnosis Date   Amblyopia of eye, right    Anxiety attack    Cancer ([place])    prostate   Exotropia of right eye    Glaucoma    Hyperlipidemia     Hypothyroidism    Pituitary tumor      SOCIAL HISTORY Social History   Socioeconomic History   Marital status: Married    Spouse name: Not on file   Number of children: Not on file   Years of education: Not on file   Highest education level: Not on file  Occupational History   Not on file  Tobacco Use   Smoking status: Never   Smokeless tobacco: Never  Vaping Use   Vaping Use: Never used  Substance and Sexual Activity   Alcohol use: No   Drug use: Never   Sexual activity: Not on file  Other Topics Concern   Not on file  Social History Narrative   Not on file   Social Determinants of Health   Financial Resource Strain: Not on file  Food Insecurity: No Food Insecurity (09/07/2022)   Hunger Vital Sign    Worried About Running Out of Food in the Last Year: Never true    Ran Out of Food in the Last Year: Never true  Transportation Needs: No Transportation Needs (09/07/2022)   PRAPARE - Hydrologist (Medical): No    Lack of Transportation (Non-Medical): No  Physical Activity: Not on file  Stress: Not on file  Social Connections: Not on file  Intimate Partner Violence: Not At Risk (09/07/2022)   Humiliation, Afraid, Rape, and Kick questionnaire    Fear of Current or Ex-Partner: No    Emotionally Abused: No    Physically Abused: No    Sexually Abused: No     FAMILY HISTORY Father had prostate cancer   Review of Systems: 12 systems reviewed Otherwise as per HPI, all other systems reviewed and negative  Physical Exam: Vitals:   09/07/22 2343 09/08/22 0409  BP: (!) 146/79 135/85  Pulse: 66 66  Resp: 17 19  Temp: 97.7 F (36.5 C) 97.8 F (36.6 C)  SpO2: 96% 100%   No intake/output data recorded.  Intake/Output Summary (Last 24 hours) at 09/08/2022 1203 Last data filed at 09/08/2022 1610 Gross per 24 hour  Intake 200 ml  Output 1400 ml  Net -1200 ml   General: well-appearing, no acute distress HEENT: anicteric sclera,  oropharynx clear without lesions CV: normal rate, no rub, no edema Lungs: clear to auscultation bilaterally, normal work of breathing Abd: soft, non-tender, non-distended Skin: no visible lesions or rashes Psych: alert, engaged, appropriate mood and affect Musculoskeletal: no obvious deformities Neuro: normal speech, no gross focal deficits   Test Results Reviewed Lab Results  Component Value Date   NA 124 (L) 09/08/2022   K 4.2 09/08/2022   CL 91 (L) 09/08/2022   CO2 19 (L) 09/08/2022   BUN 6 (L) 09/08/2022   CREATININE 0.86 09/08/2022   CALCIUM 8.8 (L) 09/08/2022   ALBUMIN 3.8 09/07/2022    CBC Recent Labs  Lab 09/06/22 2320 09/06/22 2335 09/07/22 0919 09/08/22  0252  WBC 5.8  --  7.4 5.5  NEUTROABS 3.4  --  5.4  --   HGB 18.0* 18.4* 16.8 16.9  HCT 49.7 54.0* 48.9 47.3  MCV 81.7  --  83.9 80.3  PLT 272  --  259 257    I have reviewed all relevant outside healthcare records related to the patient's current hospitalization

## 2022-09-09 DIAGNOSIS — E2740 Unspecified adrenocortical insufficiency: Secondary | ICD-10-CM | POA: Diagnosis not present

## 2022-09-09 DIAGNOSIS — E871 Hypo-osmolality and hyponatremia: Secondary | ICD-10-CM | POA: Diagnosis not present

## 2022-09-09 LAB — GLUCOSE, CAPILLARY: Glucose-Capillary: 104 mg/dL — ABNORMAL HIGH (ref 70–99)

## 2022-09-09 LAB — BASIC METABOLIC PANEL
Anion gap: 14 (ref 5–15)
BUN: 11 mg/dL (ref 8–23)
CO2: 19 mmol/L — ABNORMAL LOW (ref 22–32)
Calcium: 9.6 mg/dL (ref 8.9–10.3)
Chloride: 96 mmol/L — ABNORMAL LOW (ref 98–111)
Creatinine, Ser: 1.2 mg/dL (ref 0.61–1.24)
GFR, Estimated: 58 mL/min — ABNORMAL LOW (ref >60)
Glucose, Bld: 87 mg/dL (ref 70–99)
Potassium: 4.7 mmol/L (ref 3.5–5.1)
Sodium: 129 mmol/L — ABNORMAL LOW (ref 135–145)

## 2022-09-09 LAB — CBC
HCT: 50 % (ref 39.0–52.0)
Hemoglobin: 18 g/dL — ABNORMAL HIGH (ref 13.0–17.0)
MCH: 29 pg (ref 26.0–34.0)
MCHC: 36 g/dL (ref 30.0–36.0)
MCV: 80.6 fL (ref 80.0–100.0)
Platelets: 263 10*3/uL (ref 150–400)
RBC: 6.2 MIL/uL — ABNORMAL HIGH (ref 4.22–5.81)
RDW: 13 % (ref 11.5–15.5)
WBC: 4.7 10*3/uL (ref 4.0–10.5)
nRBC: 0 % (ref 0.0–0.2)

## 2022-09-09 LAB — SODIUM
Sodium: 127 mmol/L — ABNORMAL LOW (ref 135–145)
Sodium: 129 mmol/L — ABNORMAL LOW (ref 135–145)
Sodium: 131 mmol/L — ABNORMAL LOW (ref 135–145)
Sodium: 137 mmol/L (ref 135–145)

## 2022-09-09 LAB — OSMOLALITY, URINE: Osmolality, Ur: 132 mOsm/kg — ABNORMAL LOW (ref 300–900)

## 2022-09-09 NOTE — Progress Notes (Addendum)
PROGRESS NOTE    Alexander Yoder  ZHY:[id number] DOB: [birth year] DOA: 09/06/2022 PCP: Pa, E. Lopez   Chief Complaint  Patient presents with   Fall    Brief Narrative:  Alexander Yoder is a 88 y.o. 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   Assessment & Plan:   Principal Problem:   Acute hyponatremia Active Problems:   Fall at home, initial encounter   Nausea & vomiting   Hypothyroidism   Panhypopituitarism ([place])   Adrenal insufficiency (HCC)   Diabetes insipidus ([place])  Hyponatremia - With history of diabetes insipidus, on DDAVP at home -Roving with IV NS, most recent is 131 -Continue to hold DDAVP per renal recommendation, can be resumed at a lower dose once sodium is> 140 -Now continue to hold IV fluids, allow to eat and drink normally, monitor sodium every 8 hours -Need to follow with endocrinology as an outpatient  Panhypopituitarism Hypothyroidism adrenal insufficiency Central diabetes insipidus -On chronic cortisone supplements, cortisol a.m. level  at 4(unclear how significant this value in the setting of known panhypopituitarism), patient is feeling much better today after receiving 1 stress dose of IV hydrocortisone yesterday, and they have doubled his daily hydrocortisone dose, he is feeling much better today after increasing his hydrocortisone. -TSH level is significantly low at 0.056(TSH value is unhelpful to panhypopituitarism/secondary hypothyroidism), free T4 is 1.01 which is appropriately in the upper normal range. -Patient on IM testosterone supplement every 2 weeks, which should be continued as an  outpatient -Patient on DDAVP at home, continue to hold meds.  Fall -Mechanical, continue with PT/OT  Chronic diastolic CHF -Appears to be hypovolemic, continue with IV NS.  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 -We will continue to hold oral Lamisil  DVT prophylaxis: lovenox Code Status: Full Family Communication: none at bedside Disposition:   Status is: Inpatient  Consultants:  Renal   Subjective:  Reports he is feeling much better today (he received stress dose hydrocortisone yesterday)  Objective: Vitals:   09/08/22 2005 09/09/22 0444 09/09/22 0500 09/09/22 0812  BP: (!) 141/66 132/75  115/81  Pulse: 61 65  64  Resp: '18 16  17  '$ Temp: (!) 97.5 F (36.4 C) 98.2 F (36.8 C)  97.8 F (36.6 C)  TempSrc: Oral Oral  Oral  SpO2: 98% 95%  96%  Weight:   81.6 kg   Height:        Intake/Output Summary (Last 24 hours) at 09/09/2022 1334 Last data filed at 09/09/2022 0950 Gross per 24 hour  Intake 300 ml  Output 1550 ml  Net -1250 ml   Filed Weights   09/06/22 2324 09/08/22 0411 09/09/22 0500  Weight: 79 kg 81.6 kg 81.6 kg    Examination:  Awake Alert, Oriented X 3, No new F.N deficits, Normal affect Symmetrical Chest wall movement, Good air movement bilaterally, CTAB RRR,No Gallops,Rubs or new Murmurs, No Parasternal Heave +ve B.Sounds, Abd Soft, No tenderness, No rebound - guarding or rigidity. No Cyanosis, Clubbing or edema, No new Rash or bruise       Data Reviewed: I have personally reviewed following labs and imaging studies  CBC: Recent Labs  Lab 09/06/22 2320 09/06/22 2335 09/07/22 0919 09/08/22 0252 09/09/22 0237  WBC 5.8  --  7.4 5.5 4.7  NEUTROABS 3.4  --  5.4  --   --   HGB 18.0* 18.4* 16.8 16.9 18.0*  HCT 49.7 54.0* 48.9 47.3 50.0  MCV 81.7  --  83.9 80.3 80.6  PLT 272  --  259 257 128    Basic Metabolic Panel: Recent Labs  Lab 09/06/22 2320 09/06/22 2335 09/07/22 0554 09/07/22 0919 09/08/22 0252 09/08/22 0558 09/08/22 1925 09/08/22 2329  09/09/22 0237 09/09/22 0641 09/09/22 1105  NA 124* 124*  --  125* 120*   < > 125* 127* 129* 129* 131*  K 3.6 3.7  --  3.9 4.2  --   --   --  4.7  --   --   CL 92* 89*  --  94* 91*  --   --   --  96*  --   --   CO2 20*  --   --  23 19*  --   --   --  19*  --   --   GLUCOSE 94 96  --  87 88  --   --   --  87  --   --   BUN <5* 5*  --  <5* 6*  --   --   --  11  --   --   CREATININE 0.96 0.80  --  0.90 0.86  --   --   --  1.20  --   --   CALCIUM 9.3  --   --  8.3* 8.8*  --   --   --  9.6  --   --   MG  --   --  1.7  --   --   --   --   --   --   --   --    < > = values in this interval not displayed.    GFR: Estimated Creatinine Clearance: 43.1 mL/min (by C-G formula based on SCr of 1.2 mg/dL).  Liver Function Tests: Recent Labs  Lab 09/06/22 2320 09/07/22 0919  AST 29 27  ALT 29 24  ALKPHOS 69 62  BILITOT 1.0 0.9  PROT 7.0 6.3*  ALBUMIN 4.3 3.8    CBG: No results for input(s): "GLUCAP" in the last 168 hours.   No results found for this or any previous visit (from the past 240 hour(s)).       Radiology Studies: No results found.      Scheduled Meds:  aspirin EC  81 mg Oral Daily   brimonidine  1 drop Right Eye BID   cholecalciferol  2,000 Units Oral Daily   enoxaparin (LOVENOX) injection  40 mg Subcutaneous Q24H   hydrocortisone  20 mg Oral Q breakfast   And   hydrocortisone  10 mg Oral QAC supper   latanoprost  1 drop Both Eyes QHS   levothyroxine  112 mcg Oral QAC breakfast   Continuous Infusions:     LOS: 2 days      Phillips Climes, MD [facility]   To contact the attending provider between 7A-7P or the covering provider during after hours 7P-7A, please log into the web site [email or web address] and access using universal [place] password for that web site. If you do not have the password, please call the hospital operator.  09/09/2022, 1:34 PM

## 2022-09-09 NOTE — Progress Notes (Signed)
Nephrology Follow-Up Consult note   Assessment/Recommendations: Alexander Yoder is a/an 88 y.o. male with a past medical history significant for panhypopit, CHF, HLD who present w/ hyponatremia    Hyponatremia: Likely with mild symptoms now resolved.  Likely iatrogenic from DDAVP.  Sodium has corrected on its own with holding medication and only normal saline.  If he truly had complete DI his Na would have shot up extensively with holding his home DDAVP. Because this didn't happen he must not have complete central DI (it is possible he doesn't have DI at all). -Continue to hold DDAVP -Would let patient eat and drink normally -If Na goes above 140 can restart DDAVP 5mg BID -Recommend he f/u w/ endocrine outpatient to confirm central DI is present -Na checks every 8 hours -Stop IVFs   Volume Status: Appears near euvolemic to me. Stop IVFs   Fall: Sounds mechanical.  Hyponatremia likely contributed.  Management as above   Panhypopituitarism: Continue home medications except for desmopressin.   Hypothyroidism: Consider decreasing replacement given low TSH   Metabolic Acidosis: bicarb slightly low at 19. CTM  Given the patient's improvement in sodium we will sign off at this time.  Please do not hesitate to contact [facility] further questions.   Recommendations conveyed to primary service.    [facility] 09/09/2022 12:08 PM  ___________________________________________________________  CC: hyponatremia  Interval History/Subjective: Patient states he feels great today.  Appetite is good.  No significant nausea.  No confusion.  No other complaints.  Sodium has slowly improved   Medications:  Current Facility-Administered Medications  Medication Dose Route Frequency Provider Last Rate Last Admin   0.9 %  sodium chloride infusion   Intravenous Continuous CRaenette Rover NP 50 mL/hr at 09/09/22 0420 New Bag at 09/09/22 0420   acetaminophen (TYLENOL)  tablet 650 mg  650 mg Oral Q6H PRN Howerter, Justin B, DO   650 mg at 09/09/22 07062  Or   acetaminophen (TYLENOL) suppository 650 mg  650 mg Rectal Q6H PRN Howerter, Justin B, DO       aspirin EC tablet 81 mg  81 mg Oral Daily [name], DSilver Huguenin MD   81 mg at 09/09/22 0834   brimonidine (ALPHAGAN) 0.15 % ophthalmic solution 1 drop  1 drop Right Eye BID [name], DSilver Huguenin MD   1 drop at 09/09/22 0835   cholecalciferol (VITAMIN D3) 25 MCG (1000 UNIT) tablet 2,000 Units  2,000 Units Oral Daily [name], DSilver Huguenin MD   2,000 Units at 09/09/22 0835   enoxaparin (LOVENOX) injection 40 mg  40 mg Subcutaneous Q24H [name], DSilver Huguenin MD   40 mg at 09/08/22 2147   hydrocortisone (CORTEF) tablet 20 mg  20 mg Oral Q breakfast [name], DSilver Huguenin MD   20 mg at 09/09/22 03762  And   hydrocortisone (CORTEF) tablet 10 mg  10 mg Oral QAC supper [name], DSilver Huguenin MD   10 mg at 09/08/22 1733   latanoprost (XALATAN) 0.005 % ophthalmic solution 1 drop  1 drop Both Eyes QHS Howerter, Justin B, DO   1 drop at 09/08/22 2149   levothyroxine (SYNTHROID) tablet 112 mcg  112 mcg Oral QAC breakfast Howerter, Justin B, DO   112 mcg at 09/09/22 0835   ondansetron (ZOFRAN) injection 4 mg  4 mg Intravenous Q6H PRN Howerter, Justin B, DO   4 mg at 09/08/22 0128      Review of Systems: 10 systems reviewed and negative except per  interval history/subjective  Physical Exam: Vitals:   09/09/22 0444 09/09/22 0812  BP: 132/75 115/81  Pulse: 65 64  Resp: 16 17  Temp: 98.2 F (36.8 C) 97.8 F (36.6 C)  SpO2: 95% 96%   Total I/O In: 180 [P.O.:180] Out: 800 [Urine:800]  Intake/Output Summary (Last 24 hours) at 09/09/2022 1208 Last data filed at 09/09/2022 4132 Gross per 24 hour  Intake 300 ml  Output 2700 ml  Net -2400 ml   Constitutional: well-appearing, no acute distress ENMT: ears and nose without scars or lesions, MMM CV: normal rate, no edema Respiratory: clear to auscultation, normal work of  breathing Gastrointestinal: soft, non-tender, no palpable masses or hernias Skin: no visible lesions or rashes Psych: alert, judgement/insight appropriate, appropriate mood and affect   Test Results I personally reviewed new and old clinical labs and radiology tests Lab Results  Component Value Date   NA 131 (L) 09/09/2022   K 4.7 09/09/2022   CL 96 (L) 09/09/2022   CO2 19 (L) 09/09/2022   BUN 11 09/09/2022   CREATININE 1.20 09/09/2022   CALCIUM 9.6 09/09/2022   ALBUMIN 3.8 09/07/2022    CBC Recent Labs  Lab 09/06/22 2320 09/06/22 2335 09/07/22 0919 09/08/22 0252 09/09/22 0237  WBC 5.8  --  7.4 5.5 4.7  NEUTROABS 3.4  --  5.4  --   --   HGB 18.0*   < > 16.8 16.9 18.0*  HCT 49.7   < > 48.9 47.3 50.0  MCV 81.7  --  83.9 80.3 80.6  PLT 272  --  259 257 263   < > = values in this interval not displayed.

## 2022-09-10 ENCOUNTER — Other Ambulatory Visit (HOSPITAL_COMMUNITY): Payer: Self-pay

## 2022-09-10 DIAGNOSIS — E2740 Unspecified adrenocortical insufficiency: Secondary | ICD-10-CM | POA: Diagnosis not present

## 2022-09-10 DIAGNOSIS — E232 Diabetes insipidus: Secondary | ICD-10-CM | POA: Diagnosis not present

## 2022-09-10 DIAGNOSIS — E871 Hypo-osmolality and hyponatremia: Secondary | ICD-10-CM | POA: Diagnosis not present

## 2022-09-10 LAB — BASIC METABOLIC PANEL
Anion gap: 9 (ref 5–15)
BUN: 13 mg/dL (ref 8–23)
CO2: 23 mmol/L (ref 22–32)
Calcium: 9.8 mg/dL (ref 8.9–10.3)
Chloride: 105 mmol/L (ref 98–111)
Creatinine, Ser: 1.14 mg/dL (ref 0.61–1.24)
GFR, Estimated: >60 mL/min (ref >60)
Glucose, Bld: 80 mg/dL (ref 70–99)
Potassium: 4.1 mmol/L (ref 3.5–5.1)
Sodium: 137 mmol/L (ref 135–145)

## 2022-09-10 LAB — CBC
HCT: 52.5 % — ABNORMAL HIGH (ref 39.0–52.0)
Hemoglobin: 18.1 g/dL — ABNORMAL HIGH (ref 13.0–17.0)
MCH: 28.6 pg (ref 26.0–34.0)
MCHC: 34.5 g/dL (ref 30.0–36.0)
MCV: 83.1 fL (ref 80.0–100.0)
Platelets: 309 10*3/uL (ref 150–400)
RBC: 6.32 MIL/uL — ABNORMAL HIGH (ref 4.22–5.81)
RDW: 13.8 % (ref 11.5–15.5)
WBC: 7.5 10*3/uL (ref 4.0–10.5)
nRBC: 0 % (ref 0.0–0.2)

## 2022-09-10 LAB — SODIUM: Sodium: 137 mmol/L (ref 135–145)

## 2022-09-10 MED ORDER — MELATONIN 3 MG PO TABS
3.0000 mg | ORAL_TABLET | Freq: Every day | ORAL | Status: DC
  Filled 2022-09-10: qty 1

## 2022-09-10 MED ORDER — ACETAMINOPHEN 325 MG PO TABS: 650.0000 mg | ORAL_TABLET | Freq: Four times a day (QID) | ORAL | Status: AC | PRN

## 2022-09-10 MED ORDER — HYDROCORTISONE 10 MG PO TABS
ORAL_TABLET | ORAL | 0 refills | Status: DC
  Filled 2022-09-10: qty 90, 30d supply, fill #0

## 2022-09-10 MED ORDER — DESMOPRESSIN ACETATE 0.1 MG PO TABS
0.1000 mg | ORAL_TABLET | Freq: Two times a day (BID) | ORAL | 0 refills | Status: AC
  Filled 2022-09-10: qty 60, 30d supply, fill #0

## 2022-09-10 NOTE — Progress Notes (Signed)
PT Cancellation Note  Patient Details Name: Alexander Yoder MRN: [id number] DOB: 05-23-1930   Cancelled Treatment:    Reason Eval/Treat Not Completed: (P) Other (comment) (pt given HEP and quick verbal review, pt receptive. Pt preparing for DC and recently worked with mobility specialist so defers gait trial. Pt assisted to step pivot to wheelchair with HHA and min guard for safety to DC in care of family member.)   [place]iana 09/10/2022, 12:39 PM

## 2022-09-10 NOTE — Progress Notes (Signed)
Alexander Yoder to be D/C'd  per MD order.  Discussed with the patient and all questions fully answered.  VSS, Skin clean, dry and intact without evidence of skin break down, no evidence of skin tears noted.  IV catheter discontinued intact. Site without signs and symptoms of complications. Dressing and pressure applied.  An After Visit Summary was printed and given to the patient. Patient received prescription from [facility].  D/c education completed with patient/family including follow up instructions, medication list, d/c activities limitations if indicated, with other d/c instructions as indicated by MD - patient able to verbalize understanding, all questions fully answered.   Patient instructed to return to ED, call 911, or call MD for any changes in condition.   Patient to be escorted via [place], and D/C home via private auto.

## 2022-09-10 NOTE — Discharge Instructions (Signed)
Follow with Primary MD Pa, [facility] in 7 days   Get CBC, CMP,checked  by Primary MD next visit.    Activity: As tolerated with Full fall precautions use walker/cane & assistance as needed   Disposition Home    Diet: Regular diet.   On your next visit with your primary care physician please Get Medicines reviewed and adjusted.   Please request your Prim.MD to go over all Hospital Tests and Procedure/Radiological results at the follow up, please get all Hospital records sent to your Prim MD by signing hospital release before you go home.   If you experience worsening of your admission symptoms, develop shortness of breath, life threatening emergency, suicidal or homicidal thoughts you must seek medical attention immediately by calling 911 or calling your MD immediately  if symptoms less severe.  You Must read complete instructions/literature along with all the possible adverse reactions/side effects for all the Medicines you take and that have been prescribed to you. Take any new Medicines after you have completely understood and accpet all the possible adverse reactions/side effects.   Do not drive, operating heavy machinery, perform activities at heights, swimming or participation in water activities or provide baby sitting services if your were admitted for syncope or siezures until you have seen by Primary MD or a Neurologist and advised to do so again.  Do not drive when taking Pain medications.    Do not take more than prescribed Pain, Sleep and Anxiety Medications  Special Instructions: If you have smoked or chewed Tobacco  in the last 2 yrs please stop smoking, stop any regular Alcohol  and or any Recreational drug use.  Wear Seat belts while driving.   Please note  You were cared for by a hospitalist during your hospital stay. If you have any questions about your discharge medications or the care you received while you were in the hospital after you  are discharged, you can call the unit and asked to speak with the hospitalist on call if the hospitalist that took care of you is not available. Once you are discharged, your primary care physician will handle any further medical issues. Please note that NO REFILLS for any discharge medications will be authorized once you are discharged, as it is imperative that you return to your primary care physician (or establish a relationship with a primary care physician if you do not have one) for your aftercare needs so that they can reassess your need for medications and monitor your lab values.

## 2022-09-10 NOTE — Care Management Important Message (Signed)
Important Message  Patient Details  Name: Alexander Yoder MRN: [id number] Date of Birth: 1930-11-12   Medicare Important Message Given:  Yes     Hannah Beat 09/10/2022, 2:36 PM

## 2022-09-10 NOTE — Discharge Summary (Signed)
Physician Discharge Summary  Alexander Yoder DZH:[id number] DOB: [birth year] DOA: 09/06/2022  PCP: [facility]  Admit date: 09/06/2022 Discharge date: 09/10/2022  Admitted From: Home Disposition:  Home  Recommendations for Outpatient Follow-up:  Please check BMP within 5 days, sent to schedule an appointment with primary care physician Instructed to follow with primary endocrinologist in 1 to 2 weeks, medication has been adjusted by increasing hydrocortisone and decreasing desmopressin, please see discussion below  Home Health: Outpatient PT   Discharge Condition: (Stable) CODE STATUS: (FULL) Diet recommendation: Heart Healthy  Brief/Interim Summary:  Alexander Yoder is a 88 y.o. 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   Hyponatremia - With history of diabetes insipidus, on DDAVP at home, this is most likely caused by his nausea, vomiting and volume depletion while using DDAVP, patient was seen by renal, his DDAVP was held, treated with normal saline initially with improvement of his hyponatremia, he did not receive any IV fluids, he has good oral intake, sodium is 137 today, patient will be resumed at a lower dose desmopressin 0.1 mg p.o. twice daily, discussed with patient and the son important regarding repeat BMP this week and to follow with primary endocrinologist.    Panhypopituitarism Hypothyroidism adrenal insufficiency Central diabetes insipidus -On chronic cortisone supplements, cortisol a.m. level  at 4(unclear how significant this value in the setting of known panhypopituitarism), patient is feeling much better  after receiving 1 stress dose of IV hydrocortisone and doubling his baseline oral hydrocortisone dose (patient had nausea, poor oral intake, fatigue prior to increase dosing, this has all resolved,) will be discharged on increased dose hydrocortisone until his seen by his endocrinologist.   -TSH level is significantly low at 0.056(TSH value is unhelpful to panhypopituitarism/secondary hypothyroidism), free T4 is 1.01 which is appropriately in the upper normal range. 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  Discharge Diagnoses:  Principal Problem:   Acute hyponatremia Active Problems:   Fall at home, initial encounter   Nausea & vomiting   Hypothyroidism   Panhypopituitarism ([place])   Adrenal insufficiency ([place])   Diabetes insipidus ([place])    Discharge Instructions  Discharge Instructions     Diet - low sodium heart healthy   Complete by: As directed    Discharge instructions   Complete by: As directed    Follow with Primary MD Pa, [place] in 7 days   Get CBC, CMP,checked  by Primary MD next visit.    Activity: As tolerated with Full fall precautions use walker/cane & assistance as needed   Disposition Home    Diet: Regular diet.   On your next visit with your primary care physician please Get Medicines reviewed and adjusted.   Please request your Prim.MD to go over all Hospital Tests and Procedure/Radiological results at the follow up, please get all Hospital records sent to your  Prim MD by signing hospital release before you go home.   If you experience worsening of  your admission symptoms, develop shortness of breath, life threatening emergency, suicidal or homicidal thoughts you must seek medical attention immediately by calling 911 or calling your MD immediately  if symptoms less severe.  You Must read complete instructions/literature along with all the possible adverse reactions/side effects for all the Medicines you take and that have been prescribed to you. Take any new Medicines after you have completely understood and accpet all the possible adverse reactions/side effects.   Do not drive, operating heavy machinery, perform activities at heights, swimming or participation in water activities or provide baby sitting services if your were admitted for syncope or siezures until you have seen by Primary MD or a Neurologist and advised to do so again.  Do not drive when taking Pain medications.    Do not take more than prescribed Pain, Sleep and Anxiety Medications  Special Instructions: If you have smoked or chewed Tobacco  in the last 2 yrs please stop smoking, stop any regular Alcohol  and or any Recreational drug use.  Wear Seat belts while driving.   Please note  You were cared for by a hospitalist during your hospital stay. If you have any questions about your discharge medications or the care you received while you were in the hospital after you are discharged, you can call the unit and asked to speak with the hospitalist on call if the hospitalist that took care of you is not available. Once you are discharged, your primary care physician will handle any further medical issues. Please note that NO REFILLS for any discharge medications will be authorized once you are discharged, as it is imperative that you return to your primary care physician (or establish a relationship with a primary care physician if you do not have one) for your aftercare needs so that they can reassess your need for medications and monitor your lab values.   Increase activity  slowly   Complete by: As directed       Allergies as of 09/10/2022       Reactions   Peanut-containing Drug Products Anaphylaxis   Penicillins Other (See Comments)   Did it involve swelling of the face/tongue/throat, SOB, or low BP? Yes Did it involve sudden or severe rash/hives, skin peeling, or any reaction on the inside of your mouth or nose? Yes Did you need to seek medical attention at a hospital or doctor's office? Yes When did it last happen?     2010  If all above answers are "NO", may proceed with cephalosporin use.        Medication List     TAKE these medications    acetaminophen 325 MG tablet Commonly known as: TYLENOL Take 2 tablets (650 mg total) by mouth every 6 (six) hours as needed for mild pain (or Fever >/= 101). What changed:  medication strength how much to take reasons to take this   aspirin EC 81 MG tablet Take 81 mg by mouth daily.   B-complex with vitamin C tablet Take 1 tablet by mouth daily.   brimonidine 0.15 % ophthalmic solution Commonly known as: ALPHAGAN Place 1 drop into the right eye 2 (two) times daily.   clopidogrel 75 MG tablet Commonly known as: PLAVIX Take 1 tablet by mouth daily.   Combigan 0.2-0.5 % ophthalmic solution Generic drug: brimonidine-timolol Place 1 drop into the left eye 2 (two) times daily.  desmopressin 0.1 MG tablet Commonly known as: DDAVP Take 1 tablet (0.1 mg total) by mouth 2 (two) times daily. What changed:  medication strength how much to take   Fish Oil 1000 MG Caps Take 1,000 mg by mouth daily.   hydrocortisone 10 MG tablet Commonly known as: CORTEF Please take 20 mg in the morning, and 10 mg in the evening. What changed:  how much to take how to take this when to take this additional instructions   latanoprost 0.005 % ophthalmic solution Commonly known as: XALATAN Place 1 drop into both eyes at bedtime.   ondansetron 4 MG tablet Commonly known as: Zofran Take 1 tablet (4 mg  total) by mouth every 8 (eight) hours as needed for nausea or vomiting.   QC TUMERIC COMPLEX PO Take 1 capsule by mouth daily.   Synthroid 112 MCG tablet Generic drug: levothyroxine Take 112 mcg by mouth daily before breakfast.   testosterone cypionate 200 MG/ML injection Commonly known as: DEPOTESTOSTERONE CYPIONATE Inject 0.7 mLs into the muscle every 14 (fourteen) days.   Vitamin D 50 MCG (2000 UT) Caps Take 2,000 Units by mouth daily.        Follow-up Information     Pa, [facility] Follow up.   Contact information: [address] [place] [address] [phone]         Delrae Rend, MD Follow up.   Specialty: Endocrinology Contact information: [address] [place] [address] [phone]                Allergies  Allergen Reactions   Peanut-Containing Drug Products Anaphylaxis   Penicillins Other (See Comments)    Did it involve swelling of the face/tongue/throat, SOB, or low BP? Yes Did it involve sudden or severe rash/hives, skin peeling, or any reaction on the inside of your mouth or nose? Yes Did you need to seek medical attention at a hospital or doctor's office? Yes When did it last happen?     2010  If all above answers are "NO", may proceed with cephalosporin use.     Consultations: Renal   Procedures/Studies: DG Chest Port 1 View  Result Date: 09/07/2022 CLINICAL DATA:  Acute hyponatremia EXAM: PORTABLE CHEST 1 VIEW COMPARISON:  05/31/2022 FINDINGS: Artifact from EKG leads. Normal heart size and mediastinal contours. No acute infiltrate or edema. No effusion or pneumothorax. No acute osseous findings. IMPRESSION: Negative portable chest. Electronically Signed   By: Jorje Guild M.D.   On: 09/07/2022 07:53   CT HEAD WO CONTRAST (5MM)  Result Date: 09/06/2022 CLINICAL DATA:  Head trauma, minor (Age >= 65y); Neck trauma (Age >= 65y) EXAM: CT HEAD WITHOUT CONTRAST CT  CERVICAL SPINE WITHOUT CONTRAST TECHNIQUE: Multidetector CT imaging of the head and cervical spine was performed following the standard protocol without intravenous contrast. Multiplanar CT image reconstructions of the cervical spine were also generated. RADIATION DOSE REDUCTION: This exam was performed according to the departmental dose-optimization program which includes automated exposure control, adjustment of the mA and/or kV according to patient size and/or use of iterative reconstruction technique. COMPARISON:  None Available. FINDINGS: CT HEAD FINDINGS BRAIN: BRAIN Cerebral ventricle sizes are concordant with the degree of cerebral volume loss. Patchy and confluent areas of decreased attenuation are noted throughout the deep and periventricular white matter of the cerebral hemispheres bilaterally, compatible with chronic microvascular ischemic disease. No evidence of large-territorial acute infarction. No parenchymal hemorrhage. Redemonstration of an enlarged pituitary gland measuring  up to 2.1 cm. No extra-axial collection. No mass effect or midline shift. No hydrocephalus. Basilar cisterns are patent. Vascular: No hyperdense vessel. Atherosclerotic calcifications are present within the cavernous internal carotid and vertebral arteries. Skull: No acute fracture or focal lesion. Sinuses/Orbits: Paranasal sinuses and mastoid air cells are clear. Bilateral lens replacement. Otherwise the orbits are unremarkable. Other: None. CT CERVICAL SPINE FINDINGS Alignment: Normal. Skull base and vertebrae: Multilevel degenerative changes of the spine. No associated severe osseous neural foraminal or central canal stenosis. No acute fracture. No aggressive appearing focal osseous lesion or focal pathologic process. Soft tissues and spinal canal: No prevertebral fluid or swelling. No visible canal hematoma. Upper chest: Unremarkable. Other: None. IMPRESSION: 1. No acute intracranial abnormality. 2. No acute displaced  fracture or traumatic listhesis of the cervical spine. 3. Chronic pituitary mass lesion measuring up to 2.1 cm consistent with known lesion better evaluated on MRI head 10/09/2020. Electronically Signed   By: Iven Finn M.D.   On: 09/06/2022 23:57   CT Cervical Spine Wo Contrast  Result Date: 09/06/2022 CLINICAL DATA:  Head trauma, minor (Age >= 65y); Neck trauma (Age >= 65y) EXAM: CT HEAD WITHOUT CONTRAST CT CERVICAL SPINE WITHOUT CONTRAST TECHNIQUE: Multidetector CT imaging of the head and cervical spine was performed following the standard protocol without intravenous contrast. Multiplanar CT image reconstructions of the cervical spine were also generated. RADIATION DOSE REDUCTION: This exam was performed according to the departmental dose-optimization program which includes automated exposure control, adjustment of the mA and/or kV according to patient size and/or use of iterative reconstruction technique. COMPARISON:  None Available. FINDINGS: CT HEAD FINDINGS BRAIN: BRAIN Cerebral ventricle sizes are concordant with the degree of cerebral volume loss. Patchy and confluent areas of decreased attenuation are noted throughout the deep and periventricular white matter of the cerebral hemispheres bilaterally, compatible with chronic microvascular ischemic disease. No evidence of large-territorial acute infarction. No parenchymal hemorrhage. Redemonstration of an enlarged pituitary gland measuring up to 2.1 cm. No extra-axial collection. No mass effect or midline shift. No hydrocephalus. Basilar cisterns are patent. Vascular: No hyperdense vessel. Atherosclerotic calcifications are present within the cavernous internal carotid and vertebral arteries. Skull: No acute fracture or focal lesion. Sinuses/Orbits: Paranasal sinuses and mastoid air cells are clear. Bilateral lens replacement. Otherwise the orbits are unremarkable. Other: None. CT CERVICAL SPINE FINDINGS Alignment: Normal. Skull base and vertebrae:  Multilevel degenerative changes of the spine. No associated severe osseous neural foraminal or central canal stenosis. No acute fracture. No aggressive appearing focal osseous lesion or focal pathologic process. Soft tissues and spinal canal: No prevertebral fluid or swelling. No visible canal hematoma. Upper chest: Unremarkable. Other: None. IMPRESSION: 1. No acute intracranial abnormality. 2. No acute displaced fracture or traumatic listhesis of the cervical spine. 3. Chronic pituitary mass lesion measuring up to 2.1 cm consistent with known lesion better evaluated on MRI head 10/09/2020. Electronically Signed   By: Iven Finn M.D.   On: 09/06/2022 23:57   CT HEAD WO CONTRAST (5MM)  Result Date: 09/06/2022 CLINICAL DATA:  88 year old male with history of confusion and difficulty speaking. Headaches. Dizziness. Memory loss. EXAM: CT HEAD WITHOUT CONTRAST TECHNIQUE: Contiguous axial images were obtained from the base of the skull through the vertex without intravenous contrast. RADIATION DOSE REDUCTION: This exam was performed according to the departmental dose-optimization program which includes automated exposure control, adjustment of the mA and/or kV according to patient size and/or use of iterative reconstruction technique. COMPARISON:  Head CT 03/28/2021. FINDINGS: Brain: Chronic  intrasellar and suprasellar soft tissue mass measuring 2.4 x 1.6 cm (axial image 9 of series 2), similar to the prior study, again causing expansion and deformity of the bony sella turcica, and associated with chronic opacification of the sphenoid sinuses (unchanged). Mild cerebral atrophy. Patchy and confluent areas of decreased attenuation are noted throughout the deep and periventricular white matter of the cerebral hemispheres bilaterally, compatible with chronic microvascular ischemic disease. Physiologic calcifications in the basal ganglia bilaterally. No evidence of acute infarction, hemorrhage, hydrocephalus,  extra-axial collection or new mass lesion/mass effect. Vascular: Numerous vascular calcifications. 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 Sinuses/Orbits: Chronic opacification of the sphenoid sinuses, similar to the prior study. No acute finding. Other: None. IMPRESSION: 1. No acute intracranial abnormalities. 2. Mild cerebral atrophy with chronic microvascular ischemic changes in the cerebral white matter, as above. 3. Similar appearance of invasive pituitary tumor, as above. Electronically Signed   By: Vinnie Langton M.D.   On: 09/06/2022 11:52      Subjective: Significant events overnight, he denies any complaints, good appetite, no nausea, no vomiting, ambulating   Discharge Exam: Vitals:   09/10/22 0517 09/10/22 0800  BP: (!) 158/84 (!) 155/91  Pulse: 66 69  Resp: 18 17  Temp: 98.2 F (36.8 C) (!) 97.5 F (36.4 C)  SpO2: 96% 96%   Vitals:   09/09/22 2023 09/10/22 0432 09/10/22 0517 09/10/22 0800  BP: 124/89 114/75 (!) 158/84 (!) 155/91  Pulse: 82 65 66 69  Resp: '18 16 18 17  '$ Temp: 98.1 F (36.7 C) (!) 97.5 F (36.4 C) 98.2 F (36.8 C) (!) 97.5 F (36.4 C)  TempSrc: Oral Oral Oral Oral  SpO2: 94% 98% 96% 96%  Weight:   82.6 kg   Height:        General: Pt is alert, awake, not in acute distress Cardiovascular: RRR, S1/S2 +, no rubs, no gallops Respiratory: CTA bilaterally, no wheezing, no rhonchi Abdominal: Soft, NT, ND, bowel sounds + Extremities: no edema, no cyanosis    The results of significant diagnostics from this hospitalization (including imaging, microbiology, ancillary and laboratory) are listed below for reference.     Microbiology: No results found for this or any previous visit (from the past 240 hour(s)).   Labs: BNP (last 3 results) Recent Labs    09/07/22 0554  BNP 599.3*   Basic Metabolic Panel: Recent Labs  Lab 09/06/22 2320 09/06/22 2335  09/07/22 0554 09/07/22 0919 09/08/22 0252 09/08/22 0558 09/09/22 0237 09/09/22 0641 09/09/22 1105 09/09/22 2006 09/10/22 0226  NA 124* 124*  --  125* 120*   < > 129* 129* 131* 137 137  K 3.6 3.7  --  3.9 4.2  --  4.7  --   --   --  4.1  CL 92* 89*  --  94* 91*  --  96*  --   --   --  105  CO2 20*  --   --  23 19*  --  19*  --   --   --  23  GLUCOSE 94 96  --  87 88  --  87  --   --   --  80  BUN <5* 5*  --  <5* 6*  --  11  --   --   --  13  CREATININE 0.96 0.80  --  0.90 0.86  --  1.20  --   --   --  1.14  CALCIUM 9.3  --   --  8.3* 8.8*  --  9.6  --   --   --  9.8  MG  --   --  1.7  --   --   --   --   --   --   --   --    < > = values in this interval not displayed.   Liver Function Tests: Recent Labs  Lab 09/06/22 2320 09/07/22 0919  AST 29 27  ALT 29 24  ALKPHOS 69 62  BILITOT 1.0 0.9  PROT 7.0 6.3*  ALBUMIN 4.3 3.8   Recent Labs  Lab 09/06/22 2320  LIPASE 55*   No results for input(s): "AMMONIA" in the last 168 hours. CBC: Recent Labs  Lab 09/06/22 2320 09/06/22 2335 09/07/22 0919 09/08/22 0252 09/09/22 0237 09/10/22 0226  WBC 5.8  --  7.4 5.5 4.7 7.5  NEUTROABS 3.4  --  5.4  --   --   --   HGB 18.0* 18.4* 16.8 16.9 18.0* 18.1*  HCT 49.7 54.0* 48.9 47.3 50.0 52.5*  MCV 81.7  --  83.9 80.3 80.6 83.1  PLT 272  --  259 257 263 309   Cardiac Enzymes: No results for input(s): "CKTOTAL", "CKMB", "CKMBINDEX", "TROPONINI" in the last 168 hours. BNP: Invalid input(s): "POCBNP" CBG: Recent Labs  Lab 09/09/22 2024  GLUCAP 104*   D-Dimer No results for input(s): "DDIMER" in the last 72 hours. Hgb A1c No results for input(s): "HGBA1C" in the last 72 hours. Lipid Profile No results for input(s): "CHOL", "HDL", "LDLCALC", "TRIG", "CHOLHDL", "LDLDIRECT" in the last 72 hours. Thyroid function studies No results for input(s): "TSH", "T4TOTAL", "T3FREE", "THYROIDAB" in the last 72 hours.  Invalid input(s): "FREET3" Anemia work up No results for input(s):  "VITAMINB12", "FOLATE", "FERRITIN", "TIBC", "IRON", "RETICCTPCT" in the last 72 hours. Urinalysis    Component Value Date/Time   COLORURINE STRAW (A) 09/07/2022 0052   APPEARANCEUR CLEAR 09/07/2022 0052   LABSPEC 1.009 09/07/2022 0052   PHURINE 8.0 09/07/2022 0052   GLUCOSEU NEGATIVE 09/07/2022 0052   HGBUR NEGATIVE 09/07/2022 0052   BILIRUBINUR NEGATIVE 09/07/2022 0052   KETONESUR NEGATIVE 09/07/2022 0052   PROTEINUR NEGATIVE 09/07/2022 0052   UROBILINOGEN 0.2 05/17/2009 0253   NITRITE NEGATIVE 09/07/2022 0052   LEUKOCYTESUR NEGATIVE 09/07/2022 0052   Sepsis Labs Recent Labs  Lab 09/07/22 0919 09/08/22 0252 09/09/22 0237 09/10/22 0226  WBC 7.4 5.5 4.7 7.5   Microbiology No results found for this or any previous visit (from the past 240 hour(s)).   Time coordinating discharge: Over 30 minutes  SIGNED:   Phillips Climes, MD  [facility] 09/10/2022, 11:46 AM Pager   If 7PM-7AM, please contact night-coverage [email or web address] Password [email or web address]

## 2022-09-10 NOTE — TOC Initial Note (Signed)
Transition of Care [facility]) - Initial/Assessment Note    Patient Details  Name: Alexander Yoder MRN: [id number] Date of Birth: 1930-09-24  Transition of Care [facility] CM/SW Contact:    Marilu Favre, RN Phone Number: 09/10/2022, 12:21 PM  Clinical Narrative:                 Spoke to patient and son at bedside. Discussed OP PT. Patient in agreement . Preference for [facility] location. Entered order and asked MD to sign. AVS updated    Expected Discharge Plan: OP Rehab Barriers to Discharge: No Barriers Identified   Patient Goals and CMS Choice Patient states their goals for this hospitalization and ongoing recovery are:: to return to home CMS Medicare.gov Compare Post Acute Care list provided to:: Patient Choice offered to / list presented to : Patient  Expected Discharge Plan and Services Expected Discharge Plan: OP Rehab   Discharge Planning Services: CM Consult Post Acute Care Choice:  (OP PT) Living arrangements for the past 2 months: Single Family Home Expected Discharge Date: 09/10/22                 DME Agency: NA                  Prior Living Arrangements/Services Living arrangements for the past 2 months: Single Family Home Lives with:: Spouse Patient language and need for interpreter reviewed:: Yes        Need for Family Participation in Patient Care: Yes (Comment) Care giver support system in place?: Yes (comment) Current home services: DME Criminal Activity/Legal Involvement Pertinent to Current Situation/Hospitalization: No - Comment as needed  Activities of Daily Living Home Assistive Devices/Equipment: None ADL Screening (condition at time of admission) Patient's cognitive ability adequate to safely complete daily activities?: Yes Is the patient deaf or have difficulty hearing?: Yes Does the patient have difficulty seeing, even when wearing glasses/contacts?: No Does the patient have difficulty concentrating, remembering, or making decisions?:  Yes Patient able to express need for assistance with ADLs?: Yes Does the patient have difficulty dressing or bathing?: No Independently performs ADLs?: Yes (appropriate for developmental age) Does the patient have difficulty walking or climbing stairs?: No Weakness of Legs: Both Weakness of Arms/Hands: None  Permission Sought/Granted   Permission granted to share information with : Yes, Verbal Permission Granted  Share Information with NAME: son           Emotional Assessment Appearance:: Appears stated age Attitude/Demeanor/Rapport: Engaged Affect (typically observed): Accepting Orientation: : Oriented to Self, Oriented to Place, Oriented to  Time, Oriented to Situation Alcohol / Substance Use: Not Applicable Psych Involvement: No (comment)  Admission diagnosis:  Acute hyponatremia [E87.1] Hyponatremia syndrome [E87.1] Patient Active Problem List   Diagnosis Date Noted   Acute hyponatremia 09/07/2022   Fall at home, initial encounter 09/07/2022   Nausea & vomiting 09/07/2022   Hypothyroidism    Panhypopituitarism ([place])    Adrenal insufficiency ([place])    Diabetes insipidus ([place])    PCP:  [place], [place]:   [place] [place] [address] [place] [address] Phone: [phone] Fax: [phone]  [id number] [id number]: [phone]: [phone] [place] [place]Phone: [phone]: [phone]  Social Determinants of Health (SDOH) Interventions    Readmission Risk Interventions     No data to display           

## 2022-09-10 NOTE — Progress Notes (Signed)
Mobility Specialist - Progress Note   09/10/22 1200  Mobility  Activity Ambulated independently in hallway  Level of Assistance Modified independent, requires aide device or extra time  Assistive Device Front wheel walker  Distance Ambulated (ft) 600 ft  Activity Response Tolerated well  $Mobility charge 1 Mobility    Pt received in bed agreeable to mobility. Left in room w/ call bell in reach and all needs met.   Sydney Joyce Mobility Specialist  

## 2022-09-12 DIAGNOSIS — E230 Hypopituitarism: Secondary | ICD-10-CM | POA: Diagnosis not present

## 2022-09-12 DIAGNOSIS — D497 Neoplasm of unspecified behavior of endocrine glands and other parts of nervous system: Secondary | ICD-10-CM | POA: Diagnosis not present

## 2022-09-12 DIAGNOSIS — E232 Diabetes insipidus: Secondary | ICD-10-CM | POA: Diagnosis not present

## 2022-09-17 DIAGNOSIS — E232 Diabetes insipidus: Secondary | ICD-10-CM | POA: Diagnosis not present

## 2022-09-17 DIAGNOSIS — D497 Neoplasm of unspecified behavior of endocrine glands and other parts of nervous system: Secondary | ICD-10-CM | POA: Diagnosis not present

## 2022-09-17 DIAGNOSIS — E230 Hypopituitarism: Secondary | ICD-10-CM | POA: Diagnosis not present

## 2022-09-18 ENCOUNTER — Ambulatory Visit: Payer: Medicare Other | Attending: Internal Medicine

## 2022-09-18 ENCOUNTER — Other Ambulatory Visit: Payer: Self-pay

## 2022-09-18 DIAGNOSIS — Y92009 Unspecified place in unspecified non-institutional (private) residence as the place of occurrence of the external cause: Secondary | ICD-10-CM | POA: Diagnosis not present

## 2022-09-18 DIAGNOSIS — R2689 Other abnormalities of gait and mobility: Secondary | ICD-10-CM | POA: Diagnosis not present

## 2022-09-18 DIAGNOSIS — E871 Hypo-osmolality and hyponatremia: Secondary | ICD-10-CM | POA: Diagnosis not present

## 2022-09-18 DIAGNOSIS — W19XXXA Unspecified fall, initial encounter: Secondary | ICD-10-CM | POA: Insufficient documentation

## 2022-09-18 NOTE — Therapy (Signed)
OUTPATIENT PHYSICAL THERAPY EVALUATION  PHYSICAL THERAPY DISCHARGE SUMMARY  Visits from Start of Care: 1  Current functional level related to goals / functional outcomes: N/A   Remaining deficits: N/A   Education / Equipment: Educated on assessment findings    Patient agrees to discharge. Patient goals were  n/a . Patient is being discharged due to  skilled intervention not warranted based upon objective measures.  Patient Name: Alexander Yoder MRN: [id number] DOB:Jun 21, 1930, 88 y.o., male Today's Date: 09/18/2022   PT End of Session - 09/18/22 1447     Visit Number 1    Date for PT Re-Evaluation --   n/a d/c   Authorization Type MCR    PT Start Time 1447    PT Stop Time 1093    PT Time Calculation (min) 43 min    Activity Tolerance Patient tolerated treatment well    Behavior During Therapy WFL for tasks assessed/performed             Past Medical History:  Diagnosis Date   Amblyopia of eye, right    Anxiety attack    Cancer ([name])    prostate   Exotropia of right eye    Glaucoma    Hyperlipidemia    Hypothyroidism    Pituitary tumor    Past Surgical History:  Procedure Laterality Date   COLONOSCOPY     EYE SURGERY Bilateral    cataracts removed   PROSTATECTOMY  2000   STRABISMUS SURGERY Right 09/24/2019   Procedure: REPAIR STRABISMUS RIGHT EYE;  Surgeon: Lamonte Sakai, MD;  Location: [facility];  Service: Ophthalmology;  Laterality: Right;   TONSILLECTOMY     TRANSPHENOIDAL / TRANSNASAL HYPOPHYSECTOMY / RESECTION PITUITARY TUMOR  1998, 2010   x2 -   WISDOM TOOTH EXTRACTION     Patient Active Problem List   Diagnosis Date Noted   Acute hyponatremia 09/07/2022   Fall at home, initial encounter 09/07/2022   Nausea & vomiting 09/07/2022   Hypothyroidism    Panhypopituitarism ([name])    Adrenal insufficiency ([name])    Diabetes 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 He was taken to the ED and found to have hyponatremia. He was admitted to the hospital from 10/19 to 09/10/22. He feels that he has been gradually improving since discharge feeling that he has more energy and no nausea/dizziness and has had his "best day yet" today. He feels that he is almost functioning at his baseline since hospital discharge, but admits to chronic low back pain.   PERTINENT HISTORY: Prostate cancer Pituitary tumor  Anxiety   PAIN:  Are you having pain?No PRECAUTIONS: Fall  WEIGHT BEARING RESTRICTIONS: No  FALLS:  Has patient fallen in last 6 months? Yes. Number of falls 1 passed out when walking to the bathroom and was admitted to hospital due to hyponatremia   LIVING ENVIRONMENT: Lives with: lives with their spouse Lives in: House/apartment Stairs: Yes lives on 14th floor; uses elevator  Has following equipment at home: Single point cane  PLOF: Independent  PATIENT GOALS: "I'm not sure what I will get out of it, but if it can help my back  some"   OBJECTIVE:  PATIENT SURVEYS:  FOTO 49%  COGNITION: Overall cognitive status: Within functional limits for tasks assessed     SENSATION: WFL  POSTURE:  Kyphotic    LOWER EXTREMITY MMT:  MMT Right eval Left eval  Hip flexion 4+ 4+  Hip extension    Hip abduction 4+ 4+  Knee flexion 5 5  Knee extension 5 5   LOWER EXTREMITY SPECIAL TESTS:  BERG BALANCE TEST Sitting to Standing: 4.      Stands without using hands and stabilize independently Standing Unsupported: 4.      Stands safely for 2 minutes Sitting Unsupported: 4.     Sits for 2 minutes independently Standing to Sitting: 4.     Sits safely with minimal use of  hands Transfers: 4.     Transfers safely with minor use of hands Standing with eyes closed: 4.     Stands safely for 10 seconds  Standing with feet together: 4.     Stands for 1 minute safely Reaching forward with outstretched arm: 4.     Reaches forward 10 inches Retrieving object from the floor: 4.      Able to pick up easily and safely Turning to look behind: 4.     Looks behind from both sides and weight shifts well Turning 360 degrees: 4.     Able to turn in </=4 seconds  Place alternate foot on stool: 4.     Completes 8 steps in 20 seconds     Standing with one foot in front: 2.     Independent small step for 30 seconds Standing on one foot: 1.     Holds <3 seconds  Total Score: 51/56   FUNCTIONAL TESTS:  5 x STS 15.5 seconds TUG 10.5 seconds  1480 ft 6 MWT   GAIT: Distance walked: 10 ft  Assistive device utilized: None Level of assistance: Complete Independence Comments: limited foot clearance      PATIENT EDUCATION:  Education details: Education on exam findings.  Person educated: Patient Education method: Explanation Education comprehension: verbalized understanding  HOME EXERCISE PROGRAM: N/A   ASSESSMENT: CLINICAL IMPRESSION: Patient is a 88 y.o. male who was seen today for physical therapy evaluation and treatment following recently hospitalization due to a fall resultant from hyponatremia. Patient reports that he has nearly returned to his baseline function since hospital discharge and reports no difficulty with daily activities. Upon assessment he scores at a low fall risk based upon the BERG and the TUG. He surpasses the normative value for distance walked during the 6 MWT with no signs of gait instability. He has excellent BLE strength. Based upon these assessment findings skilled PT is not warranted at this time with patient in agreement with this plan.   OBJECTIVE IMPAIRMENTS: decreased balance, decreased strength, and postural dysfunction.   ACTIVITY  LIMITATIONS:  none  PARTICIPATION LIMITATIONS:  none  PERSONAL FACTORS: Age and Fitness are also affecting patient's functional outcome.   REHAB POTENTIAL: Excellent  CLINICAL DECISION MAKING: Stable/uncomplicated  EVALUATION COMPLEXITY: Low   GOALS: N/A; patient evaluated and discharged.  PLAN: PT FREQUENCY: N/A  PT DURATION: N/A  PLANNED INTERVENTIONS: N/A  PLAN FOR NEXT SESSION: N/A  Gwendolyn Grant, PT, DPT, ATC 09/18/22 3:45 PM

## 2022-10-05 ENCOUNTER — Other Ambulatory Visit: Payer: Self-pay | Admitting: Internal Medicine

## 2022-10-05 DIAGNOSIS — G459 Transient cerebral ischemic attack, unspecified: Secondary | ICD-10-CM

## 2022-10-05 DIAGNOSIS — E230 Hypopituitarism: Secondary | ICD-10-CM | POA: Diagnosis not present

## 2022-10-05 DIAGNOSIS — D497 Neoplasm of unspecified behavior of endocrine glands and other parts of nervous system: Secondary | ICD-10-CM | POA: Diagnosis not present

## 2022-10-05 DIAGNOSIS — E232 Diabetes insipidus: Secondary | ICD-10-CM | POA: Diagnosis not present

## 2022-10-07 ENCOUNTER — Ambulatory Visit
Admission: RE | Admit: 2022-10-07 | Discharge: 2022-10-07 | Disposition: A | Discharge status: Home / Self Care | Payer: Medicare Other | Source: Ambulatory Visit | Attending: Internal Medicine | Admitting: Internal Medicine

## 2022-10-07 DIAGNOSIS — D497 Neoplasm of unspecified behavior of endocrine glands and other parts of nervous system: Secondary | ICD-10-CM

## 2022-10-07 DIAGNOSIS — G459 Transient cerebral ischemic attack, unspecified: Secondary | ICD-10-CM

## 2022-10-07 DIAGNOSIS — D352 Benign neoplasm of pituitary gland: Secondary | ICD-10-CM | POA: Diagnosis not present

## 2022-10-07 MED ORDER — GADOPICLENOL 0.5 MMOL/ML IV SOLN
8.0000 mL | Freq: Once | INTRAVENOUS | Status: AC | PRN
  Administered 2022-10-07: 8 mL via INTRAVENOUS

## 2022-10-08 ENCOUNTER — Other Ambulatory Visit: Payer: Self-pay | Admitting: Internal Medicine

## 2022-10-08 DIAGNOSIS — H53123 Transient visual loss, bilateral: Secondary | ICD-10-CM | POA: Diagnosis not present

## 2022-10-08 DIAGNOSIS — I358 Other nonrheumatic aortic valve disorders: Secondary | ICD-10-CM | POA: Diagnosis not present

## 2022-10-08 DIAGNOSIS — D497 Neoplasm of unspecified behavior of endocrine glands and other parts of nervous system: Secondary | ICD-10-CM

## 2022-10-08 DIAGNOSIS — M545 Low back pain, unspecified: Secondary | ICD-10-CM | POA: Diagnosis not present

## 2022-10-08 DIAGNOSIS — Z0000 Encounter for general adult medical examination without abnormal findings: Secondary | ICD-10-CM | POA: Diagnosis not present

## 2022-10-08 DIAGNOSIS — E230 Hypopituitarism: Secondary | ICD-10-CM | POA: Diagnosis not present

## 2022-10-08 DIAGNOSIS — G459 Transient cerebral ischemic attack, unspecified: Secondary | ICD-10-CM

## 2022-10-08 DIAGNOSIS — H401133 Primary open-angle glaucoma, bilateral, severe stage: Secondary | ICD-10-CM | POA: Diagnosis not present

## 2022-10-08 DIAGNOSIS — I771 Stricture of artery: Secondary | ICD-10-CM | POA: Diagnosis not present

## 2022-10-08 DIAGNOSIS — E871 Hypo-osmolality and hyponatremia: Secondary | ICD-10-CM | POA: Diagnosis not present

## 2022-10-08 DIAGNOSIS — Z1331 Encounter for screening for depression: Secondary | ICD-10-CM | POA: Diagnosis not present

## 2022-10-08 DIAGNOSIS — Z79899 Other long term (current) drug therapy: Secondary | ICD-10-CM | POA: Diagnosis not present

## 2022-10-08 DIAGNOSIS — R252 Cramp and spasm: Secondary | ICD-10-CM | POA: Diagnosis not present

## 2022-10-08 DIAGNOSIS — R110 Nausea: Secondary | ICD-10-CM | POA: Diagnosis not present

## 2022-10-08 DIAGNOSIS — H9190 Unspecified hearing loss, unspecified ear: Secondary | ICD-10-CM | POA: Diagnosis not present

## 2022-10-08 DIAGNOSIS — E559 Vitamin D deficiency, unspecified: Secondary | ICD-10-CM | POA: Diagnosis not present

## 2022-10-08 DIAGNOSIS — I651 Occlusion and stenosis of basilar artery: Secondary | ICD-10-CM | POA: Diagnosis not present

## 2022-10-09 DIAGNOSIS — M47816 Spondylosis without myelopathy or radiculopathy, lumbar region: Secondary | ICD-10-CM | POA: Diagnosis not present

## 2022-10-10 DIAGNOSIS — Z23 Encounter for immunization: Secondary | ICD-10-CM | POA: Diagnosis not present

## 2022-11-15 DIAGNOSIS — E230 Hypopituitarism: Secondary | ICD-10-CM | POA: Diagnosis not present

## 2022-11-15 DIAGNOSIS — M545 Low back pain, unspecified: Secondary | ICD-10-CM | POA: Diagnosis not present

## 2022-11-15 DIAGNOSIS — D497 Neoplasm of unspecified behavior of endocrine glands and other parts of nervous system: Secondary | ICD-10-CM | POA: Diagnosis not present

## 2022-11-15 DIAGNOSIS — E232 Diabetes insipidus: Secondary | ICD-10-CM | POA: Diagnosis not present

## 2022-11-22 DIAGNOSIS — Z23 Encounter for immunization: Secondary | ICD-10-CM | POA: Diagnosis not present

## 2022-11-26 DIAGNOSIS — M47816 Spondylosis without myelopathy or radiculopathy, lumbar region: Secondary | ICD-10-CM | POA: Diagnosis not present

## 2022-11-29 ENCOUNTER — Ambulatory Visit (INDEPENDENT_AMBULATORY_CARE_PROVIDER_SITE_OTHER): Payer: Medicare Other | Admitting: Podiatry

## 2022-11-29 ENCOUNTER — Encounter: Payer: Self-pay | Admitting: Podiatry

## 2022-11-29 VITALS — BP 168/75 | HR 69

## 2022-11-29 DIAGNOSIS — L603 Nail dystrophy: Secondary | ICD-10-CM

## 2022-11-29 NOTE — Progress Notes (Signed)
Presents today for follow-up of his nail fungus he is completed 1 every other day dosing of his Lamisil.  He states that he had to go to the ER for fainting and due to low sodium and mild hypertension.  Objective: Vital signs stable though his blood pressure is mildly elevated 168/75 his pulse is 69 he is alert oriented x 3.  There is no erythema edema salines drainage odor toenails are thick yellow dystrophic onychomycotic did not appear to be resolving at this point.  Assessment: Long-term therapy with Lamisil being discontinued due to hyponatremia.  Plan: I recommend that he continue to stay off of the medication and to follow-up with his primary care provider.

## 2022-12-05 DIAGNOSIS — L821 Other seborrheic keratosis: Secondary | ICD-10-CM | POA: Diagnosis not present

## 2022-12-05 DIAGNOSIS — L570 Actinic keratosis: Secondary | ICD-10-CM | POA: Diagnosis not present

## 2022-12-05 DIAGNOSIS — B078 Other viral warts: Secondary | ICD-10-CM | POA: Diagnosis not present

## 2022-12-05 DIAGNOSIS — Z85828 Personal history of other malignant neoplasm of skin: Secondary | ICD-10-CM | POA: Diagnosis not present

## 2022-12-05 DIAGNOSIS — D1801 Hemangioma of skin and subcutaneous tissue: Secondary | ICD-10-CM | POA: Diagnosis not present

## 2022-12-07 DIAGNOSIS — Z125 Encounter for screening for malignant neoplasm of prostate: Secondary | ICD-10-CM | POA: Diagnosis not present

## 2022-12-07 DIAGNOSIS — E230 Hypopituitarism: Secondary | ICD-10-CM | POA: Diagnosis not present

## 2022-12-07 DIAGNOSIS — E232 Diabetes insipidus: Secondary | ICD-10-CM | POA: Diagnosis not present

## 2022-12-07 DIAGNOSIS — D497 Neoplasm of unspecified behavior of endocrine glands and other parts of nervous system: Secondary | ICD-10-CM | POA: Diagnosis not present

## 2022-12-07 DIAGNOSIS — M545 Low back pain, unspecified: Secondary | ICD-10-CM | POA: Diagnosis not present

## 2022-12-20 DIAGNOSIS — M5416 Radiculopathy, lumbar region: Secondary | ICD-10-CM | POA: Diagnosis not present

## 2023-01-15 DIAGNOSIS — M47816 Spondylosis without myelopathy or radiculopathy, lumbar region: Secondary | ICD-10-CM | POA: Diagnosis not present

## 2023-01-17 DIAGNOSIS — M79605 Pain in left leg: Secondary | ICD-10-CM | POA: Diagnosis not present

## 2023-01-17 DIAGNOSIS — E871 Hypo-osmolality and hyponatremia: Secondary | ICD-10-CM | POA: Diagnosis not present

## 2023-01-17 DIAGNOSIS — M79604 Pain in right leg: Secondary | ICD-10-CM | POA: Diagnosis not present

## 2023-01-17 DIAGNOSIS — I651 Occlusion and stenosis of basilar artery: Secondary | ICD-10-CM | POA: Diagnosis not present

## 2023-01-19 ENCOUNTER — Emergency Department (HOSPITAL_COMMUNITY): Payer: Medicare Other

## 2023-01-19 ENCOUNTER — Inpatient Hospital Stay (HOSPITAL_COMMUNITY)
Admission: EM | Admit: 2023-01-19 | Discharge: 2023-01-21 | DRG: 178 | Disposition: A | Discharge status: Home / Self Care | Payer: Medicare Other | Attending: Internal Medicine | Admitting: Internal Medicine

## 2023-01-19 ENCOUNTER — Other Ambulatory Visit: Payer: Self-pay

## 2023-01-19 DIAGNOSIS — E039 Hypothyroidism, unspecified: Secondary | ICD-10-CM | POA: Diagnosis present

## 2023-01-19 DIAGNOSIS — Z91010 Allergy to peanuts: Secondary | ICD-10-CM | POA: Diagnosis not present

## 2023-01-19 DIAGNOSIS — H409 Unspecified glaucoma: Secondary | ICD-10-CM | POA: Diagnosis present

## 2023-01-19 DIAGNOSIS — Z9841 Cataract extraction status, right eye: Secondary | ICD-10-CM | POA: Diagnosis not present

## 2023-01-19 DIAGNOSIS — E860 Dehydration: Secondary | ICD-10-CM | POA: Diagnosis present

## 2023-01-19 DIAGNOSIS — Z8546 Personal history of malignant neoplasm of prostate: Secondary | ICD-10-CM | POA: Diagnosis not present

## 2023-01-19 DIAGNOSIS — Z7952 Long term (current) use of systemic steroids: Secondary | ICD-10-CM | POA: Diagnosis not present

## 2023-01-19 DIAGNOSIS — Z7902 Long term (current) use of antithrombotics/antiplatelets: Secondary | ICD-10-CM

## 2023-01-19 DIAGNOSIS — Z79899 Other long term (current) drug therapy: Secondary | ICD-10-CM

## 2023-01-19 DIAGNOSIS — E273 Drug-induced adrenocortical insufficiency: Secondary | ICD-10-CM | POA: Diagnosis present

## 2023-01-19 DIAGNOSIS — Z9842 Cataract extraction status, left eye: Secondary | ICD-10-CM | POA: Diagnosis not present

## 2023-01-19 DIAGNOSIS — I5032 Chronic diastolic (congestive) heart failure: Secondary | ICD-10-CM | POA: Diagnosis not present

## 2023-01-19 DIAGNOSIS — E785 Hyperlipidemia, unspecified: Secondary | ICD-10-CM | POA: Diagnosis present

## 2023-01-19 DIAGNOSIS — Z7982 Long term (current) use of aspirin: Secondary | ICD-10-CM

## 2023-01-19 DIAGNOSIS — U071 COVID-19: Principal | ICD-10-CM

## 2023-01-19 DIAGNOSIS — E2740 Unspecified adrenocortical insufficiency: Secondary | ICD-10-CM | POA: Diagnosis not present

## 2023-01-19 DIAGNOSIS — W19XXXA Unspecified fall, initial encounter: Secondary | ICD-10-CM | POA: Diagnosis not present

## 2023-01-19 DIAGNOSIS — R55 Syncope and collapse: Secondary | ICD-10-CM

## 2023-01-19 DIAGNOSIS — R070 Pain in throat: Secondary | ICD-10-CM | POA: Diagnosis not present

## 2023-01-19 DIAGNOSIS — Z79890 Hormone replacement therapy (postmenopausal): Secondary | ICD-10-CM

## 2023-01-19 DIAGNOSIS — F411 Generalized anxiety disorder: Secondary | ICD-10-CM | POA: Diagnosis present

## 2023-01-19 DIAGNOSIS — Z888 Allergy status to other drugs, medicaments and biological substances status: Secondary | ICD-10-CM | POA: Diagnosis not present

## 2023-01-19 DIAGNOSIS — D751 Secondary polycythemia: Secondary | ICD-10-CM | POA: Diagnosis not present

## 2023-01-19 DIAGNOSIS — E232 Diabetes insipidus: Secondary | ICD-10-CM | POA: Diagnosis present

## 2023-01-19 DIAGNOSIS — Z043 Encounter for examination and observation following other accident: Secondary | ICD-10-CM | POA: Diagnosis not present

## 2023-01-19 DIAGNOSIS — M4802 Spinal stenosis, cervical region: Secondary | ICD-10-CM | POA: Diagnosis not present

## 2023-01-19 DIAGNOSIS — R059 Cough, unspecified: Secondary | ICD-10-CM | POA: Diagnosis not present

## 2023-01-19 DIAGNOSIS — E230 Hypopituitarism: Secondary | ICD-10-CM | POA: Diagnosis present

## 2023-01-19 DIAGNOSIS — Z880 Allergy status to penicillin: Secondary | ICD-10-CM

## 2023-01-19 DIAGNOSIS — R7989 Other specified abnormal findings of blood chemistry: Secondary | ICD-10-CM | POA: Diagnosis present

## 2023-01-19 DIAGNOSIS — S0990XA Unspecified injury of head, initial encounter: Secondary | ICD-10-CM | POA: Diagnosis not present

## 2023-01-19 DIAGNOSIS — E872 Acidosis, unspecified: Secondary | ICD-10-CM | POA: Diagnosis present

## 2023-01-19 DIAGNOSIS — T380X5A Adverse effect of glucocorticoids and synthetic analogues, initial encounter: Secondary | ICD-10-CM | POA: Diagnosis present

## 2023-01-19 DIAGNOSIS — R1111 Vomiting without nausea: Secondary | ICD-10-CM | POA: Diagnosis not present

## 2023-01-19 LAB — LACTIC ACID, PLASMA
Lactic Acid, Venous: 2.5 mmol/L (ref 0.5–1.9)
Lactic Acid, Venous: 1.5 mmol/L (ref 0.5–1.9)

## 2023-01-19 LAB — RESP PANEL BY RT-PCR (RSV, FLU A&B, COVID)  RVPGX2
Influenza A by PCR: NEGATIVE
Influenza B by PCR: NEGATIVE
Resp Syncytial Virus by PCR: NEGATIVE
SARS Coronavirus 2 by RT PCR: POSITIVE — AB

## 2023-01-19 LAB — URINALYSIS, ROUTINE W REFLEX MICROSCOPIC
Bacteria, UA: NONE SEEN
Bilirubin Urine: NEGATIVE
Glucose, UA: NEGATIVE mg/dL
Ketones, ur: NEGATIVE mg/dL
Leukocytes,Ua: NEGATIVE
Nitrite: NEGATIVE
Protein, ur: NEGATIVE mg/dL
Specific Gravity, Urine: 1.012 (ref 1.005–1.030)
pH: 6 (ref 5.0–8.0)

## 2023-01-19 LAB — CBC WITH DIFFERENTIAL/PLATELET
Abs Immature Granulocytes: 0.03 10*3/uL (ref 0.00–0.07)
Basophils Absolute: 0.1 10*3/uL (ref 0.0–0.1)
Basophils Relative: 1 %
Eosinophils Absolute: 0.2 10*3/uL (ref 0.0–0.5)
Eosinophils Relative: 2 %
HCT: 53.3 % — ABNORMAL HIGH (ref 39.0–52.0)
Hemoglobin: 17.9 g/dL — ABNORMAL HIGH (ref 13.0–17.0)
Immature Granulocytes: 0 %
Lymphocytes Relative: 16 %
Lymphs Abs: 1.3 10*3/uL (ref 0.7–4.0)
MCH: 28.8 pg (ref 26.0–34.0)
MCHC: 33.6 g/dL (ref 30.0–36.0)
MCV: 85.8 fL (ref 80.0–100.0)
Monocytes Absolute: 1.1 10*3/uL — ABNORMAL HIGH (ref 0.1–1.0)
Monocytes Relative: 13 %
Neutro Abs: 5.5 10*3/uL (ref 1.7–7.7)
Neutrophils Relative %: 68 %
Platelets: 198 10*3/uL (ref 150–400)
RBC: 6.21 MIL/uL — ABNORMAL HIGH (ref 4.22–5.81)
RDW: 14 % (ref 11.5–15.5)
WBC: 8.2 10*3/uL (ref 4.0–10.5)
nRBC: 0 % (ref 0.0–0.2)

## 2023-01-19 LAB — I-STAT CHEM 8, ED
BUN: 10 mg/dL (ref 8–23)
Calcium, Ion: 1 mmol/L — ABNORMAL LOW (ref 1.15–1.40)
Chloride: 100 mmol/L (ref 98–111)
Creatinine, Ser: 1.1 mg/dL (ref 0.61–1.24)
Glucose, Bld: 83 mg/dL (ref 70–99)
HCT: 53 % — ABNORMAL HIGH (ref 39.0–52.0)
Hemoglobin: 18 g/dL — ABNORMAL HIGH (ref 13.0–17.0)
Potassium: 3.8 mmol/L (ref 3.5–5.1)
Sodium: 136 mmol/L (ref 135–145)
TCO2: 26 mmol/L (ref 22–32)

## 2023-01-19 LAB — COMPREHENSIVE METABOLIC PANEL
ALT: 29 U/L (ref 0–44)
AST: 31 U/L (ref 15–41)
Albumin: 3.8 g/dL (ref 3.5–5.0)
Alkaline Phosphatase: 69 U/L (ref 38–126)
Anion gap: 10 (ref 5–15)
BUN: 9 mg/dL (ref 8–23)
CO2: 24 mmol/L (ref 22–32)
Calcium: 8.8 mg/dL — ABNORMAL LOW (ref 8.9–10.3)
Chloride: 101 mmol/L (ref 98–111)
Creatinine, Ser: 1.22 mg/dL (ref 0.61–1.24)
GFR, Estimated: 56 mL/min — ABNORMAL LOW (ref >60)
Glucose, Bld: 87 mg/dL (ref 70–99)
Potassium: 3.8 mmol/L (ref 3.5–5.1)
Sodium: 135 mmol/L (ref 135–145)
Total Bilirubin: 1 mg/dL (ref 0.3–1.2)
Total Protein: 6.7 g/dL (ref 6.5–8.1)

## 2023-01-19 LAB — PROTIME-INR
INR: 1.1 (ref 0.8–1.2)
Prothrombin Time: 13.8 seconds (ref 11.4–15.2)

## 2023-01-19 LAB — TSH: TSH: 0.037 u[IU]/mL — ABNORMAL LOW (ref 0.350–4.500)

## 2023-01-19 LAB — T4, FREE: Free T4: 1.08 ng/dL (ref 0.61–1.12)

## 2023-01-19 LAB — CBG MONITORING, ED: Glucose-Capillary: 83 mg/dL (ref 70–99)

## 2023-01-19 MED ORDER — SODIUM CHLORIDE 0.9 % IV BOLUS: 500.0000 mL | Freq: Once | INTRAVENOUS | Status: DC

## 2023-01-19 MED ORDER — DESMOPRESSIN ACETATE 0.1 MG PO TABS
0.1000 mg | ORAL_TABLET | Freq: Two times a day (BID) | ORAL | Status: DC
  Administered 2023-01-19 – 2023-01-21 (×4): 0.1 mg via ORAL
  Filled 2023-01-19 (×6): qty 1

## 2023-01-19 MED ORDER — SODIUM CHLORIDE 0.9 % IV SOLN: INTRAVENOUS | Status: AC

## 2023-01-19 MED ORDER — ENOXAPARIN SODIUM 40 MG/0.4ML IJ SOSY
40.0000 mg | PREFILLED_SYRINGE | INTRAMUSCULAR | Status: DC
  Administered 2023-01-19 – 2023-01-21 (×3): 40 mg via SUBCUTANEOUS
  Filled 2023-01-19 (×3): qty 0.4

## 2023-01-19 MED ORDER — ONDANSETRON HCL 4 MG/2ML IJ SOLN
4.0000 mg | Freq: Four times a day (QID) | INTRAMUSCULAR | Status: DC | PRN
  Administered 2023-01-19: 4 mg via INTRAVENOUS
  Filled 2023-01-19: qty 2

## 2023-01-19 MED ORDER — HYDROCORTISONE 10 MG PO TABS
10.0000 mg | ORAL_TABLET | Freq: Every morning | ORAL | Status: DC
  Administered 2023-01-20: 10 mg via ORAL
  Filled 2023-01-19: qty 2
  Filled 2023-01-19: qty 1

## 2023-01-19 MED ORDER — BRIMONIDINE TARTRATE 0.15 % OP SOLN
1.0000 [drp] | Freq: Two times a day (BID) | OPHTHALMIC | Status: DC
  Filled 2023-01-19: qty 5

## 2023-01-19 MED ORDER — HYDROCORTISONE 5 MG PO TABS
5.0000 mg | ORAL_TABLET | Freq: Every evening | ORAL | Status: DC
  Administered 2023-01-19: 5 mg via ORAL
  Filled 2023-01-19: qty 1

## 2023-01-19 MED ORDER — CLOPIDOGREL BISULFATE 75 MG PO TABS
75.0000 mg | ORAL_TABLET | Freq: Every day | ORAL | Status: DC
  Administered 2023-01-19 – 2023-01-21 (×3): 75 mg via ORAL
  Filled 2023-01-19 (×3): qty 1

## 2023-01-19 MED ORDER — LEVOTHYROXINE SODIUM 112 MCG PO TABS
112.0000 ug | ORAL_TABLET | Freq: Every day | ORAL | Status: DC
  Administered 2023-01-19 – 2023-01-20 (×2): 112 ug via ORAL
  Filled 2023-01-19 (×2): qty 1

## 2023-01-19 MED ORDER — SODIUM CHLORIDE 0.9% FLUSH
3.0000 mL | Freq: Two times a day (BID) | INTRAVENOUS | Status: DC
  Administered 2023-01-19 – 2023-01-21 (×5): 3 mL via INTRAVENOUS

## 2023-01-19 MED ORDER — SODIUM CHLORIDE 0.9 % IV BOLUS
500.0000 mL | Freq: Once | INTRAVENOUS | Status: AC
  Administered 2023-01-19: 500 mL via INTRAVENOUS

## 2023-01-19 MED ORDER — GUAIFENESIN-DM 100-10 MG/5ML PO SYRP
5.0000 mL | ORAL_SOLUTION | ORAL | Status: DC | PRN
  Administered 2023-01-20 – 2023-01-21 (×2): 5 mL via ORAL
  Filled 2023-01-19 (×2): qty 5

## 2023-01-19 MED ORDER — TIMOLOL MALEATE 0.5 % OP SOLN
1.0000 [drp] | Freq: Two times a day (BID) | OPHTHALMIC | Status: DC
  Administered 2023-01-19 – 2023-01-21 (×5): 1 [drp] via OPHTHALMIC
  Filled 2023-01-19: qty 5

## 2023-01-19 MED ORDER — BRIMONIDINE TARTRATE 0.2 % OP SOLN
1.0000 [drp] | Freq: Two times a day (BID) | OPHTHALMIC | Status: DC
  Administered 2023-01-19 – 2023-01-21 (×5): 1 [drp] via OPHTHALMIC
  Filled 2023-01-19: qty 5

## 2023-01-19 MED ORDER — ALBUTEROL SULFATE (2.5 MG/3ML) 0.083% IN NEBU: 2.5000 mg | INHALATION_SOLUTION | Freq: Four times a day (QID) | RESPIRATORY_TRACT | Status: DC | PRN

## 2023-01-19 MED ORDER — LATANOPROST 0.005 % OP SOLN
1.0000 [drp] | Freq: Every day | OPHTHALMIC | Status: DC
  Administered 2023-01-19 – 2023-01-20 (×2): 1 [drp] via OPHTHALMIC
  Filled 2023-01-19: qty 2.5

## 2023-01-19 MED ORDER — ACETAMINOPHEN 650 MG RE SUPP: 650.0000 mg | Freq: Four times a day (QID) | RECTAL | Status: DC | PRN

## 2023-01-19 MED ORDER — CALCIUM GLUCONATE-NACL 1-0.675 GM/50ML-% IV SOLN
1.0000 g | Freq: Once | INTRAVENOUS | Status: AC
  Administered 2023-01-19: 1000 mg via INTRAVENOUS
  Filled 2023-01-19: qty 50

## 2023-01-19 MED ORDER — BRIMONIDINE TARTRATE-TIMOLOL 0.2-0.5 % OP SOLN
1.0000 [drp] | Freq: Two times a day (BID) | OPHTHALMIC | Status: DC
  Filled 2023-01-19: qty 5

## 2023-01-19 MED ORDER — ADULT MULTIVITAMIN W/MINERALS CH
1.0000 | ORAL_TABLET | Freq: Every day | ORAL | Status: DC
  Administered 2023-01-19 – 2023-01-21 (×3): 1 via ORAL
  Filled 2023-01-19 (×3): qty 1

## 2023-01-19 MED ORDER — ONDANSETRON HCL 4 MG PO TABS: 4.0000 mg | ORAL_TABLET | Freq: Four times a day (QID) | ORAL | Status: DC | PRN

## 2023-01-19 MED ORDER — ASPIRIN 81 MG PO TBEC
81.0000 mg | DELAYED_RELEASE_TABLET | Freq: Every day | ORAL | Status: DC
  Administered 2023-01-19 – 2023-01-21 (×3): 81 mg via ORAL
  Filled 2023-01-19 (×3): qty 1

## 2023-01-19 MED ORDER — NIRMATRELVIR/RITONAVIR (PAXLOVID)TABLET: 3.0000 | ORAL_TABLET | Freq: Two times a day (BID) | ORAL | Status: DC

## 2023-01-19 MED ORDER — MELATONIN 5 MG PO TABS
5.0000 mg | ORAL_TABLET | Freq: Every evening | ORAL | Status: DC | PRN
  Administered 2023-01-19: 5 mg via ORAL
  Filled 2023-01-19: qty 1

## 2023-01-19 MED ORDER — GUAIFENESIN ER 600 MG PO TB12
600.0000 mg | ORAL_TABLET | Freq: Two times a day (BID) | ORAL | Status: DC
  Administered 2023-01-19 – 2023-01-21 (×5): 600 mg via ORAL
  Filled 2023-01-19 (×5): qty 1

## 2023-01-19 MED ORDER — NIRMATRELVIR/RITONAVIR (PAXLOVID) TABLET (RENAL DOSING)
2.0000 | ORAL_TABLET | Freq: Two times a day (BID) | ORAL | Status: DC
  Administered 2023-01-19 – 2023-01-21 (×4): 2 via ORAL
  Filled 2023-01-19: qty 20

## 2023-01-19 MED ORDER — ACETAMINOPHEN 325 MG PO TABS
650.0000 mg | ORAL_TABLET | Freq: Four times a day (QID) | ORAL | Status: DC | PRN
  Administered 2023-01-19: 650 mg via ORAL
  Filled 2023-01-19: qty 2

## 2023-01-19 MED ORDER — PHENOL 1.4 % MT LIQD
1.0000 | OROMUCOSAL | Status: DC | PRN
  Administered 2023-01-19: 1 via OROMUCOSAL
  Filled 2023-01-19: qty 177

## 2023-01-19 NOTE — ED Notes (Signed)
PT back from CT

## 2023-01-19 NOTE — ED Provider Notes (Signed)
Patient care taken over at shift handoff from outgoing provider. For detailed HPI, please refer to previous provider's note.  In short patient presents for evaluation of nasal congestion, cough x 1 day and syncopex 1. Physical Exam  BP (!) 170/72   Pulse 91   Temp 97.6 F (36.4 C) (Oral)   Resp 16   Ht '5\' 10"'$  (1.778 m)   Wt 83 kg   SpO2 100%   BMI 26.26 kg/m   Physical Exam Vitals and nursing note reviewed.  Constitutional:      Appearance: Normal appearance.  HENT:     Head: Normocephalic and atraumatic.     Mouth/Throat:     Mouth: Mucous membranes are dry.  Eyes:     General: No scleral icterus. Cardiovascular:     Rate and Rhythm: Normal rate and regular rhythm.     Pulses: Normal pulses.     Heart sounds: Normal heart sounds.  Pulmonary:     Effort: Pulmonary effort is normal.     Breath sounds: Normal breath sounds.  Abdominal:     General: Abdomen is flat.     Palpations: Abdomen is soft.     Tenderness: There is no abdominal tenderness.  Musculoskeletal:        General: No deformity.  Skin:    General: Skin is warm.     Findings: No rash.  Neurological:     General: No focal deficit present.     Mental Status: He is alert.  Psychiatric:        Mood and Affect: Mood normal.     Procedures  Procedures  ED Course / MDM    Medical Decision Making Amount and/or Complexity of Data Reviewed Labs: ordered. Radiology: ordered.  Risk Decision regarding hospitalization.   89 year old male history of hypothyroidism, pituitary mass, hyperlipidemia presents for evaluation of nasal congestion, cough and syncope.  Physical exam showed signs of dehydration with dry mucous membrane.  Workup included CT scan of the head which was negative for any acute intracranial abnormalities, no acute change in the pituitary mass.  CT neck and chest x-ray negative.  CBC with no evidence of leukocytosis or anemia.  Lactic acid was found to be elevated at 2.5.  CMP with no acute  electrolyte abnormalities.  BUN/creatinine is unremarkable. Covid is positive.  I do think patient requires admission for monitoring of lactic acid and COVID.  I discussed case with Dr. Tamala Julian hospitalist who agreed to admit the patient.       Rex Kras, PA 01/19/23 1515    Carmin Muskrat, MD 01/19/23 [phone]

## 2023-01-19 NOTE — ED Notes (Signed)
ED TO INPATIENT HANDOFF REPORT  ED Nurse Name and Phone #: Kei Langhorst [phone]  S Name/Age/Gender Alexander Yoder 89 y.o. male Room/Bed: 018C/018C  Code Status   Code Status: Full Code  Home/SNF/Other Home Patient oriented to: self, place, time, and situation Is this baseline? Yes   Triage Complete: Triage complete  Chief Complaint COVID-19 [U07.1]  Triage Note Pt arrives to ED c/o Fall/congestion/sore throat. Pt reports having unwitnessed fall w/ no LOC/head trauma. Pt is on blood thinners. Pt endorses n/v x several hours. Cold symptoms x 1 day   Allergies Allergies  Allergen Reactions   Peanut-Containing Drug Products Anaphylaxis   Penicillins Other (See Comments)    Did it involve swelling of the face/tongue/throat, SOB, or low BP? Yes Did it involve sudden or severe rash/hives, skin peeling, or any reaction on the inside of your mouth or nose? Yes Did you need to seek medical attention at a hospital or doctor's office? Yes When did it last happen?     2010  If all above answers are "NO", may proceed with cephalosporin use.    Rosuvastatin Calcium     Other Reaction(s): leg pains    Level of Care/Admitting Diagnosis ED Disposition     ED Disposition  Admit   Condition  --   Comment  Hospital Area: [facility] [[id number]]  Level of Care: Telemetry Medical [104]  May place patient in observation at [facility] or [facility] if equivalent level of care is available:: No  Covid Evaluation: Confirmed COVID Positive  Diagnosis: COVID-19 [id number]  Admitting Physician: Norval Morton [id number]  Attending Physician: Norval Morton [id number]          B Medical/Surgery History Past Medical History:  Diagnosis Date   Amblyopia of eye, right    Anxiety attack    Cancer ([place])    prostate   Exotropia of right eye    Glaucoma    Hyperlipidemia    Hypothyroidism    Pituitary tumor    Past Surgical History:  Procedure Laterality Date    COLONOSCOPY     EYE SURGERY Bilateral    cataracts removed   PROSTATECTOMY  2000   STRABISMUS SURGERY Right 09/24/2019   Procedure: REPAIR STRABISMUS RIGHT EYE;  Surgeon: Lamonte Sakai, MD;  Location: [facility];  Service: Ophthalmology;  Laterality: Right;   TONSILLECTOMY     TRANSPHENOIDAL / TRANSNASAL HYPOPHYSECTOMY / RESECTION PITUITARY TUMOR  1998, 2010   x2 -   WISDOM TOOTH EXTRACTION       A IV Location/Drains/Wounds Patient Lines/Drains/Airways Status     Active Line/Drains/Airways     Name Placement date Placement time Site Days   Peripheral IV 01/19/23 22 G Posterior;Right Forearm 01/19/23  0520  Forearm  less than 1   Peripheral IV 01/19/23 20 G Right Antecubital 01/19/23  0750  Antecubital  less than 1   Peripheral IV 01/19/23 22 G Left Antecubital 01/19/23  0650  Antecubital  less than 1   External Urinary Catheter 01/19/23  [id number]  --  less than 1   Incision (Closed) 09/24/19 Eye Right 09/24/19  0801  -- 1213            Intake/Output Last 24 hours  Intake/Output Summary (Last 24 hours) at 01/19/2023 1015 Last data filed at 01/19/2023 [id number] Gross per 24 hour  Intake --  Output 700 ml  Net -700 ml    Labs/Imaging Results for orders placed or performed during the hospital encounter of  01/19/23 (from the past 48 hour(s))  CBG monitoring, ED     Status: None   Collection Time: 01/19/23  5:42 AM  Result Value Ref Range   Glucose-Capillary 83 70 - 99 mg/dL    Comment: Glucose reference range applies only to samples taken after fasting for at least 8 hours.  CBC with Differential     Status: Abnormal   Collection Time: 01/19/23  5:44 AM  Result Value Ref Range   WBC 8.2 4.0 - 10.5 K/uL   RBC 6.21 (H) 4.22 - 5.81 MIL/uL   Hemoglobin 17.9 (H) 13.0 - 17.0 g/dL   HCT 53.3 (H) 39.0 - 52.0 %   MCV 85.8 80.0 - 100.0 fL   MCH 28.8 26.0 - 34.0 pg   MCHC 33.6 30.0 - 36.0 g/dL   RDW 14.0 11.5 - 15.5 %   Platelets 198 150 - 400 K/uL   nRBC 0.0 0.0 - 0.2 %   Neutrophils  Relative % 68 %   Neutro Abs 5.5 1.7 - 7.7 K/uL   Lymphocytes Relative 16 %   Lymphs Abs 1.3 0.7 - 4.0 K/uL   Monocytes Relative 13 %   Monocytes Absolute 1.1 (H) 0.1 - 1.0 K/uL   Eosinophils Relative 2 %   Eosinophils Absolute 0.2 0.0 - 0.5 K/uL   Basophils Relative 1 %   Basophils Absolute 0.1 0.0 - 0.1 K/uL   Immature Granulocytes 0 %   Abs Immature Granulocytes 0.03 0.00 - 0.07 K/uL    Comment: Performed at [facility], [address] [place], [place] [address]  Comprehensive metabolic panel     Status: Abnormal   Collection Time: 01/19/23  5:44 AM  Result Value Ref Range   Sodium 135 135 - 145 mmol/L   Potassium 3.8 3.5 - 5.1 mmol/L   Chloride 101 98 - 111 mmol/L   CO2 24 22 - 32 mmol/L   Glucose, Bld 87 70 - 99 mg/dL    Comment: Glucose reference range applies only to samples taken after fasting for at least 8 hours.   BUN 9 8 - 23 mg/dL   Creatinine, Ser 1.22 0.61 - 1.24 mg/dL   Calcium 8.8 (L) 8.9 - 10.3 mg/dL   Total Protein 6.7 6.5 - 8.1 g/dL   Albumin 3.8 3.5 - 5.0 g/dL   AST 31 15 - 41 U/L   ALT 29 0 - 44 U/L   Alkaline Phosphatase 69 38 - 126 U/L   Total Bilirubin 1.0 0.3 - 1.2 mg/dL   GFR, Estimated 56 (L) >60 mL/min    Comment: (NOTE) Calculated using the CKD-EPI Creatinine Equation (2021)    Anion gap 10 5 - 15    Comment: Performed at [facility] [address], [place], [place] [address]  Lactic acid, plasma     Status: Abnormal   Collection Time: 01/19/23  5:44 AM  Result Value Ref Range   Lactic Acid, Venous 2.5 (HH) 0.5 - 1.9 mmol/L    Comment: CRITICAL RESULT CALLED TO, READ BACK BY AND VERIFIED WITH ARNETT COBB RN 01/19/23 [id number] Wiliam Ke Performed at [facility], [place] [address] [place], [place] [address]   Protime-INR     Status: None   Collection Time: 01/19/23  5:44 AM  Result Value Ref Range   Prothrombin Time 13.8 11.4 - 15.2 seconds   INR 1.1 0.8 - 1.2    Comment: (NOTE) INR goal varies based on device and disease  states.  Performed at [facility], [place] [address], [place], [place] [address]   I-stat chem 8, ED     Status: Abnormal   Collection Time: 01/19/23  6:08 AM  Result Value Ref Range   Sodium 136 135 - 145 mmol/L   Potassium 3.8 3.5 - 5.1 mmol/L   Chloride 100 98 - 111 mmol/L   BUN 10 8 - 23 mg/dL   Creatinine, Ser 1.10 0.61 - 1.24 mg/dL   Glucose, Bld 83 70 - 99 mg/dL    Comment: Glucose reference range applies only to samples taken after fasting for at least 8 hours.   Calcium, Ion 1.00 (L) 1.15 - 1.40 mmol/L   TCO2 26 22 - 32 mmol/L   Hemoglobin 18.0 (H) 13.0 - 17.0 g/dL   HCT 53.0 (H) 39.0 - 52.0 %  Resp panel by RT-PCR (RSV, Flu A&B, Covid) Anterior Nasal Swab     Status: Abnormal   Collection Time: 01/19/23  6:20 AM   Specimen: Anterior Nasal Swab  Result Value Ref Range   SARS Coronavirus 2 by RT PCR POSITIVE (A) NEGATIVE   Influenza A by PCR NEGATIVE NEGATIVE   Influenza B by PCR NEGATIVE NEGATIVE    Comment: (NOTE) The Xpert Xpress SARS-CoV-2/FLU/RSV plus assay is intended as an aid in the diagnosis of influenza from Nasopharyngeal swab specimens and should not be used as a sole basis for treatment. Nasal washings and aspirates are unacceptable for Xpert Xpress SARS-CoV-2/FLU/RSV testing.  Fact Sheet for Patients: EntrepreneurPulse.com.au  Fact Sheet for Healthcare Providers: IncredibleEmployment.be  This test is not yet approved or cleared by the [place] FDA and has been authorized for detection and/or diagnosis of SARS-CoV-2 by FDA under an Emergency Use Authorization (EUA). This EUA will remain in effect (meaning this test can be used) for the duration of the COVID-19 declaration under Section 564(b)(1) of the Act, 21 U.S.C. section 360bbb-3(b)(1), unless the authorization is terminated or revoked.     Resp Syncytial Virus by PCR NEGATIVE NEGATIVE    Comment: (NOTE) Fact Sheet for  Patients: EntrepreneurPulse.com.au  Fact Sheet for Healthcare Providers: IncredibleEmployment.be  This test is not yet approved or cleared by the [place] FDA and has been authorized for detection and/or diagnosis of SARS-CoV-2 by FDA under an Emergency Use Authorization (EUA). This EUA will remain in effect (meaning this test can be used) for the duration of the COVID-19 declaration under Section 564(b)(1) of the Act, 21 U.S.C. section 360bbb-3(b)(1), unless the authorization is terminated or revoked.  Performed at [facility], [place] [address] [place], [place] [address]   Urinalysis, Routine w reflex microscopic -Urine, Clean Catch     Status: Abnormal   Collection Time: 01/19/23  6:20 AM  Result Value Ref Range   Color, Urine YELLOW YELLOW   APPearance CLEAR CLEAR   Specific Gravity, Urine 1.012 1.005 - 1.030   pH 6.0 5.0 - 8.0   Glucose, UA NEGATIVE NEGATIVE mg/dL   Hgb urine dipstick SMALL (A) NEGATIVE   Bilirubin Urine NEGATIVE NEGATIVE   Ketones, ur NEGATIVE NEGATIVE mg/dL   Protein, ur NEGATIVE NEGATIVE mg/dL   Nitrite NEGATIVE NEGATIVE   Leukocytes,Ua NEGATIVE NEGATIVE   RBC / HPF 0-5 0 - 5 RBC/hpf   WBC, UA 0-5 0 - 5 WBC/hpf   Bacteria, UA NONE SEEN NONE SEEN   Squamous Epithelial / HPF 0-5 0 - 5 /HPF    Comment: Performed at [facility], [place],  [place] [address]  Lactic acid, plasma     Status: None   Collection Time: 01/19/23  7:48 AM  Result Value Ref Range   Lactic Acid, Venous 1.5 0.5 - 1.9 mmol/L    Comment: Performed at [facility] [address], [place], [place] [address]  TSH     Status: Abnormal   Collection Time: 01/19/23  8:00 AM  Result Value Ref Range   TSH 0.037 (L) 0.350 - 4.500 uIU/mL    Comment: Performed by a 3rd Generation assay with a functional sensitivity of <=0.01 uIU/mL. Performed at [facility], [place] [address], [place], [place] [address]    CT  Head Wo Contrast  Result Date: 01/19/2023 CLINICAL DATA:  Head and neck trauma EXAM: CT HEAD WITHOUT CONTRAST CT CERVICAL SPINE WITHOUT CONTRAST TECHNIQUE: Multidetector CT imaging of the head and cervical spine was performed following the standard protocol without intravenous contrast. Multiplanar CT image reconstructions of the cervical spine were also generated. RADIATION DOSE REDUCTION: This exam was performed according to the departmental dose-optimization program which includes automated exposure control, adjustment of the mA and/or kV according to patient size and/or use of iterative reconstruction technique. COMPARISON:  09/06/2022 FINDINGS: CT HEAD FINDINGS Brain: No evidence of acute infarction, hemorrhage, hydrocephalus, extra-axial collection or mass effect. Known pituitary mass with changes of transsphenoidal hypophysectomy. Tumor extends into the sphenoid sinuses. Recent MRI characterization 10/07/2022. Vascular: No hyperdense vessel or unexpected calcification. Skull: Negative for fracture Sinuses/Orbits: No evidence of injury. Postoperative changes of transsphenoidal access. CT CERVICAL SPINE FINDINGS Alignment: Normal. Skull base and vertebrae: No acute fracture. No primary bone lesion or focal pathologic process. Soft tissues and spinal canal: No prevertebral fluid or swelling. No visible canal hematoma. Disc levels: Generalized disc space narrowing and degenerative spurring. Up to mild spinal stenosis at C5-6. Upper chest: Negative IMPRESSION: 1. No evidence of acute intracranial or cervical spine injury. 2. Chronic findings are described above. Electronically Signed   By: Jorje Guild M.D.   On: 01/19/2023 06:29   CT Cervical Spine Wo Contrast  Result Date: 01/19/2023 CLINICAL DATA:  Head and neck trauma EXAM: CT HEAD WITHOUT CONTRAST CT CERVICAL SPINE WITHOUT CONTRAST TECHNIQUE: Multidetector CT imaging of the head and cervical spine was performed following the standard protocol without  intravenous contrast. Multiplanar CT image reconstructions of the cervical spine were also generated. RADIATION DOSE REDUCTION: This exam was performed according to the departmental dose-optimization program which includes automated exposure control, adjustment of the mA and/or kV according to patient size and/or use of iterative reconstruction technique. COMPARISON:  09/06/2022 FINDINGS: CT HEAD FINDINGS Brain: No evidence of acute infarction, hemorrhage, hydrocephalus, extra-axial collection or mass effect. Known pituitary mass with changes of transsphenoidal hypophysectomy. Tumor extends into the sphenoid sinuses. Recent MRI characterization 10/07/2022. Vascular: No hyperdense vessel or unexpected calcification. Skull: Negative for fracture Sinuses/Orbits: No evidence of injury. Postoperative changes of transsphenoidal access. CT CERVICAL SPINE FINDINGS Alignment: Normal. Skull base and vertebrae: No acute fracture. No primary bone lesion or focal pathologic process. Soft tissues and spinal canal: No prevertebral fluid or swelling. No visible canal hematoma. Disc levels: Generalized disc space narrowing and degenerative spurring. Up to mild spinal stenosis at C5-6. Upper chest: Negative IMPRESSION: 1. No evidence of acute intracranial or cervical spine injury. 2. Chronic findings are described above. Electronically Signed   By: Jorje Guild M.D.   On: 01/19/2023 06:29   DG Chest Port 1 View  Result Date: 01/19/2023 CLINICAL DATA:  Syncope with cough . EXAM: PORTABLE CHEST 1 VIEW COMPARISON:  09/07/2022 FINDINGS: The lungs are clear without focal pneumonia, edema, pneumothorax or pleural effusion. The cardiopericardial silhouette is within normal limits for size. The visualized bony structures of the thorax are unremarkable. Telemetry leads overlie the chest. IMPRESSION: No active disease. Electronically Signed   By: Misty Stanley M.D.   On: 01/19/2023 05:47   DG Pelvis Portable  Result Date:  01/19/2023 CLINICAL DATA:  Syncope and fall. EXAM: PORTABLE PELVIS 1-2 VIEWS COMPARISON:  05/31/2021. FINDINGS: No evidence for an acute fracture. Surgical clips in the pelvis again noted. SI joints and symphysis pubis unremarkable. IMPRESSION: Negative. Electronically Signed   By: Misty Stanley M.D.   On: 01/19/2023 05:46    Pending Labs Unresulted Labs (From admission, onward)     Start     Ordered   01/20/23 0500  CBC  Tomorrow morning,   R        01/19/23 0803   01/20/23 [id number]  Basic metabolic panel  Tomorrow morning,   R        01/19/23 0803   01/19/23 0809  T4, free  Once,   R        01/19/23 0808   01/19/23 0636  Culture, blood (routine x 2)  BLOOD CULTURE X 2,   R      01/19/23 0635            Vitals/Pain Today's Vitals   01/19/23 0510 01/19/23 0517 01/19/23 0545 01/19/23 0912  BP: 126/89 (!) 145/80 (!) 170/72   Pulse: 90 90 91   Resp: '16 16 16   '$ Temp: 97.6 F (36.4 C) 97.6 F (36.4 C)  97.8 F (36.6 C)  TempSrc:  Oral  Oral  SpO2: 97% 98% 100%   Weight:      Height:      PainSc:        Isolation Precautions Airborne and Contact precautions  Medications Medications  multivitamin with minerals tablet 1 tablet (1 tablet Oral Given 01/19/23 0938)  aspirin EC tablet 81 mg (81 mg Oral Given 01/19/23 0938)  desmopressin (DDAVP) tablet 0.1 mg (0.1 mg Oral Not Given 01/19/23 0940)  hydrocortisone (CORTEF) tablet 10 mg (10 mg Oral Not Given 01/19/23 0839)  levothyroxine (SYNTHROID) tablet 112 mcg (112 mcg Oral Given 01/19/23 0839)  clopidogrel (PLAVIX) tablet 75 mg (75 mg Oral Given 01/19/23 0938)  brimonidine (ALPHAGAN) 0.15 % ophthalmic solution 1 drop (1 drop Right Eye Not Given 01/19/23 0940)  brimonidine-timolol (COMBIGAN) 0.2-0.5 % ophthalmic solution 1 drop (1 drop Left Eye Not Given 01/19/23 0940)  latanoprost (XALATAN) 0.005 % ophthalmic solution 1 drop (has no administration in time range)  enoxaparin (LOVENOX) injection 40 mg (40 mg Subcutaneous Given 01/19/23 0937)  sodium  chloride flush (NS) 0.9 % injection 3 mL (3 mLs Intravenous Given 01/19/23 0941)  acetaminophen (TYLENOL) tablet 650 mg (650 mg Oral Given 01/19/23 0939)    Or  acetaminophen (TYLENOL) suppository 650 mg ( Rectal See Alternative 01/19/23 0939)  ondansetron (ZOFRAN) tablet 4 mg (has no administration in time range)    Or  ondansetron (ZOFRAN) injection 4 mg (has no administration in time range)  albuterol (PROVENTIL) (2.5 MG/3ML) 0.083% nebulizer solution 2.5 mg (has no administration in time range)  guaiFENesin (MUCINEX) 12 hr tablet 600 mg (600 mg Oral Given 01/19/23 0938)  nirmatrelvir/ritonavir (renal dosing) (PAXLOVID) 2 tablet (2 tablets Oral Not Given 01/19/23 0941)  hydrocortisone (CORTEF) tablet 5 mg (has no administration in  time range)  sodium chloride 0.9 % bolus 500 mL (0 mLs Intravenous Stopped 01/19/23 0735)  sodium chloride 0.9 % bolus 500 mL ( Intravenous Infusion Verify 01/19/23 0919)  calcium gluconate 1 g/ 50 mL sodium chloride IVPB (0 mg Intravenous Stopped 01/19/23 0932)    Mobility walks      R Recommendations: See Admitting Provider Note  Report given to:   Additional Notes:

## 2023-01-19 NOTE — Plan of Care (Signed)

## 2023-01-19 NOTE — ED Triage Notes (Signed)
Pt arrives to ED c/o Fall/congestion/sore throat. Pt reports having unwitnessed fall w/ no LOC/head trauma. Pt is on blood thinners. Pt endorses n/v x several hours. Cold symptoms x 1 day

## 2023-01-19 NOTE — ED Notes (Signed)
To CT

## 2023-01-19 NOTE — Progress Notes (Signed)
   01/19/23 0515  Spiritual Encounters  Type of Visit Initial  Reason for visit Code  OnCall Visit Yes   [facility] responded to Fall on Thinners code. Pt unavailable as medical team worked.  No support person present.  [facility] services remain available by page

## 2023-01-19 NOTE — ED Notes (Signed)
Daughter in law Dorice [place] [phone] would also like to be updated.

## 2023-01-19 NOTE — ED Provider Notes (Signed)
[facility] Provider Note   CSN: [id number] Arrival date & time: 01/19/23  0500     History  Chief Complaint  Patient presents with   Nasal Congestion   Fall    Alexander Yoder is a 89 y.o. male.  89 year old male brought in by EMS after syncopal episode at home.  Patient states that he has had a cough, sore throat, runny nose for the past day or so, was ambulating to the sofa with on the ground.  He denies any specific injuries.  States he had a similar episode in the past with due to low sodium, feels like he has not been hydrating well, reports 1 episode of vomiting at home, no changes in bowel habits.       Home Medications Prior to Admission medications   Medication Sig Start Date End Date Taking? Authorizing Provider  acetaminophen (TYLENOL) 325 MG tablet Take 2 tablets (650 mg total) by mouth every 6 (six) hours as needed for mild pain (or Fever >/= 101). 09/10/22  Yes [name], [name], MD  aspirin EC 81 MG tablet Take 81 mg by mouth daily.   Yes [provider]  B Complex-C (B-COMPLEX WITH VITAMIN C) tablet Take 1 tablet by mouth daily.    [provider]  brimonidine (ALPHAGAN) 0.15 % ophthalmic solution Place 1 drop into the right eye 2 (two) times daily.    [provider]  Cholecalciferol (VITAMIN D) 50 MCG (2000 UT) CAPS Take 2,000 Units by mouth daily.    [provider]  clopidogrel (PLAVIX) 75 MG tablet Take 1 tablet by mouth daily. 03/29/21   [provider]  COMBIGAN 0.2-0.5 % ophthalmic solution Place 1 drop into the left eye 2 (two) times daily. 08/20/22   [provider]  desmopressin (DDAVP) 0.1 MG tablet Take 1 tablet (0.1 mg total) by mouth 2 (two) times daily. 09/10/22   [name], [name], MD  hydrocortisone (CORTEF) 10 MG tablet Take  2 tablets ('20mg'$ ) by mouth in the morning, and 1 tablet ('10mg'$ ) in the evening. 09/10/22   [name], [name], MD   latanoprost (XALATAN) 0.005 % ophthalmic solution Place 1 drop into both eyes at bedtime.    [provider]  Omega-3 Fatty Acids (FISH OIL) 1000 MG CAPS Take 1,000 mg by mouth daily.    [provider]  ondansetron (ZOFRAN) 4 MG tablet Take 1 tablet (4 mg total) by mouth every 8 (eight) hours as needed for nausea or vomiting. 10/20/21   Carvel Getting, NP  SYNTHROID 112 MCG tablet Take 112 mcg by mouth daily before breakfast.  07/29/19   [provider]  testosterone cypionate (DEPOTESTOSTERONE CYPIONATE) 200 MG/ML injection Inject 0.7 mLs into the muscle every 14 (fourteen) days. 10/31/20   [provider]  Turmeric (QC TUMERIC COMPLEX PO) Take 1 capsule by mouth daily.    [provider]      Allergies    Peanut-containing drug products and Penicillins    Review of Systems   Review of Systems Negative except as per HPI Physical Exam Updated Vital Signs BP (!) 170/72   Pulse 91   Temp 97.6 F (36.4 C) (Oral)   Resp 16   Ht '5\' 10"'$  (1.778 m)   Wt 83 kg   SpO2 100%   BMI 26.26 kg/m  Physical Exam Vitals and nursing note reviewed.  Constitutional:      General: He is not in acute  distress.    Appearance: He is well-developed. He is not diaphoretic.  HENT:     Head: Normocephalic and atraumatic.     Mouth/Throat:     Mouth: Mucous membranes are dry.  Cardiovascular:     Rate and Rhythm: Normal rate and regular rhythm.     Heart sounds: Normal heart sounds.  Pulmonary:     Effort: Pulmonary effort is normal.     Breath sounds: Normal breath sounds.  Abdominal:     Palpations: Abdomen is soft.     Tenderness: There is no abdominal tenderness.  Musculoskeletal:        General: No swelling or tenderness. Normal range of motion.     Cervical back: Neck supple.     Right lower leg: No edema.     Left lower leg: No edema.  Skin:    General: Skin is warm and dry.  Neurological:     Mental Status: He is alert and oriented to person,  place, and time.  Psychiatric:        Behavior: Behavior normal.     ED Results / Procedures / Treatments   Labs (all labs ordered are listed, but only abnormal results are displayed) Labs Reviewed  CBC WITH DIFFERENTIAL/PLATELET - Abnormal; Notable for the following components:      Result Value   RBC 6.21 (*)    Hemoglobin 17.9 (*)    HCT 53.3 (*)    Monocytes Absolute 1.1 (*)    All other components within normal limits  COMPREHENSIVE METABOLIC PANEL - Abnormal; Notable for the following components:   Calcium 8.8 (*)    GFR, Estimated 56 (*)    All other components within normal limits  LACTIC ACID, PLASMA - Abnormal; Notable for the following components:   Lactic Acid, Venous 2.5 (*)    All other components within normal limits  URINALYSIS, ROUTINE W REFLEX MICROSCOPIC - Abnormal; Notable for the following components:   Hgb urine dipstick SMALL (*)    All other components within normal limits  I-STAT CHEM 8, ED - Abnormal; Notable for the following components:   Calcium, Ion 1.00 (*)    Hemoglobin 18.0 (*)    HCT 53.0 (*)    All other components within normal limits  RESP PANEL BY RT-PCR (RSV, FLU A&B, COVID)  RVPGX2  CULTURE, BLOOD (ROUTINE X 2)  CULTURE, BLOOD (ROUTINE X 2)  PROTIME-INR  LACTIC ACID, PLASMA  CBG MONITORING, ED    EKG None  Radiology CT Head Wo Contrast  Result Date: 01/19/2023 CLINICAL DATA:  Head and neck trauma EXAM: CT HEAD WITHOUT CONTRAST CT CERVICAL SPINE WITHOUT CONTRAST TECHNIQUE: Multidetector CT imaging of the head and cervical spine was performed following the standard protocol without intravenous contrast. Multiplanar CT image reconstructions of the cervical spine were also generated. RADIATION DOSE REDUCTION: This exam was performed according to the departmental dose-optimization program which includes automated exposure control, adjustment of the mA and/or kV according to patient size and/or use of iterative reconstruction technique.  COMPARISON:  09/06/2022 FINDINGS: CT HEAD FINDINGS Brain: No evidence of acute infarction, hemorrhage, hydrocephalus, extra-axial collection or mass effect. Known pituitary mass with changes of transsphenoidal hypophysectomy. Tumor extends into the sphenoid sinuses. Recent MRI characterization 10/07/2022. Vascular: No hyperdense vessel or unexpected calcification. Skull: Negative for fracture Sinuses/Orbits: No evidence of injury. Postoperative changes of transsphenoidal access. CT CERVICAL SPINE FINDINGS Alignment: Normal. Skull base and vertebrae: No acute fracture. No primary bone lesion or focal pathologic process. Soft  tissues and spinal canal: No prevertebral fluid or swelling. No visible canal hematoma. Disc levels: Generalized disc space narrowing and degenerative spurring. Up to mild spinal stenosis at C5-6. Upper chest: Negative IMPRESSION: 1. No evidence of acute intracranial or cervical spine injury. 2. Chronic findings are described above. Electronically Signed   By: Jorje Guild M.D.   On: 01/19/2023 06:29   CT Cervical Spine Wo Contrast  Result Date: 01/19/2023 CLINICAL DATA:  Head and neck trauma EXAM: CT HEAD WITHOUT CONTRAST CT CERVICAL SPINE WITHOUT CONTRAST TECHNIQUE: Multidetector CT imaging of the head and cervical spine was performed following the standard protocol without intravenous contrast. Multiplanar CT image reconstructions of the cervical spine were also generated. RADIATION DOSE REDUCTION: This exam was performed according to the departmental dose-optimization program which includes automated exposure control, adjustment of the mA and/or kV according to patient size and/or use of iterative reconstruction technique. COMPARISON:  09/06/2022 FINDINGS: CT HEAD FINDINGS Brain: No evidence of acute infarction, hemorrhage, hydrocephalus, extra-axial collection or mass effect. Known pituitary mass with changes of transsphenoidal hypophysectomy. Tumor extends into the sphenoid sinuses.  Recent MRI characterization 10/07/2022. Vascular: No hyperdense vessel or unexpected calcification. Skull: Negative for fracture Sinuses/Orbits: No evidence of injury. Postoperative changes of transsphenoidal access. CT CERVICAL SPINE FINDINGS Alignment: Normal. Skull base and vertebrae: No acute fracture. No primary bone lesion or focal pathologic process. Soft tissues and spinal canal: No prevertebral fluid or swelling. No visible canal hematoma. Disc levels: Generalized disc space narrowing and degenerative spurring. Up to mild spinal stenosis at C5-6. Upper chest: Negative IMPRESSION: 1. No evidence of acute intracranial or cervical spine injury. 2. Chronic findings are described above. Electronically Signed   By: Jorje Guild M.D.   On: 01/19/2023 06:29   DG Chest Port 1 View  Result Date: 01/19/2023 CLINICAL DATA:  Syncope with cough . EXAM: PORTABLE CHEST 1 VIEW COMPARISON:  09/07/2022 FINDINGS: The lungs are clear without focal pneumonia, edema, pneumothorax or pleural effusion. The cardiopericardial silhouette is within normal limits for size. The visualized bony structures of the thorax are unremarkable. Telemetry leads overlie the chest. IMPRESSION: No active disease. Electronically Signed   By: Misty Stanley M.D.   On: 01/19/2023 05:47   DG Pelvis Portable  Result Date: 01/19/2023 CLINICAL DATA:  Syncope and fall. EXAM: PORTABLE PELVIS 1-2 VIEWS COMPARISON:  05/31/2021. FINDINGS: No evidence for an acute fracture. Surgical clips in the pelvis again noted. SI joints and symphysis pubis unremarkable. IMPRESSION: Negative. Electronically Signed   By: Misty Stanley M.D.   On: 01/19/2023 05:46    Procedures Procedures    Medications Ordered in ED Medications  sodium chloride 0.9 % bolus 500 mL (500 mLs Intravenous New Bag/Given 01/19/23 0550)  sodium chloride 0.9 % bolus 500 mL (500 mLs Intravenous New Bag/Given 01/19/23 0636)    ED Course/ Medical Decision Making/ A&P                              Medical Decision Making  This patient presents to the ED for concern of syncope, URI symptoms, vomiting, this involves an extensive number of treatment options, and is a complaint that carries with it a high risk of complications and morbidity.  The differential diagnosis includes but not limited to traumatic injury, metabolic disturbance, sepsis, viral illness, PNA, UTI   Co morbidities that complicate the patient evaluation  Diabetes, hyperlipidemia, hypothyroid, pituitary tumor, prostate cancer    Additional history  obtained:  Additional history obtained from EMS who contributes to history as above External records from outside source obtained and reviewed including prior labs on file for comparison    Lab Tests:  I Ordered, and personally interpreted labs.  The pertinent results include:  CBG normal, CBC with mildly elevated hgb at 17.9. CMP without significant findings. INR WNL. Lactic elevated at 2.5.   Imaging Studies ordered:  I ordered imaging studies including CXR, xr pelvis, CT head, CT c-spine  I independently visualized and interpreted imaging which showed no acute intracranial trauma/actue c-spine trauma. CXR unremarkable, pelvis unremarkable.  I agree with the radiologist interpretation -known pituitary mass   Cardiac Monitoring: / EKG:  The patient was maintained on a cardiac monitor.  I personally viewed and interpreted the cardiac monitored which showed an underlying rhythm of: sinus rhythm, rate 93   Consultations Obtained:  I requested consultation with the ER attending, Dr. Randal Buba,  and discussed lab and imaging findings as well as pertinent plan - they recommend: has seen the patient, suspect viral illness leading to syncopal event, fluids for elevated lactic. Hold on abx with normal WBC, afebrile, BP stable, pending viral panel   Problem List / ED Course / Critical interventions / Medication management  89 year old male with runny nose, sore  throat, cough, syncope as above. Labs with elevated lactic, UA does not suggest infection, CXR does not show PNA. Provided with fluids. Care signed out pending resp viral panel and admission.  Level 2 trauma for fall on thinners (Plavix) I ordered medication including fluids  for syncope, vomiting  Reevaluation of the patient after these medicines showed that the patient stayed the same I have reviewed the patients home medicines and have made adjustments as needed   Social Determinants of Health:  Has PCP   Test / Admission - Considered:  admit         Final Clinical Impression(s) / ED Diagnoses Final diagnoses:  Syncope, unspecified syncope type  Elevated lactic acid level    Rx / DC Orders ED Discharge Orders     None         Tacy Learn, PA-C 01/19/23 [place], April, MD 01/19/23 [phone]

## 2023-01-19 NOTE — Evaluation (Signed)
Physical Therapy Evaluation Patient Details Name: Alexander Yoder MRN: [id number] DOB: July 21, 1930 Today's Date: 01/19/2023  History of Present Illness  89 year old male brought in by EMS after syncopal episode at home 3/2. COVID+ PMH: pituitary tumor, CHF, glaucome, prostate cancer, diabetes insipidus  Clinical Impression  Pt admitted with above diagnosis. Limited physical assessment due to dizziness (acute) and lower back pain (chronic). Pt cares for his wife but has help 7x/week for 5hrs/day from aides. He is independent at baseline with 2 reported falls in the past 6 months. Today, pt is assist to rise to EOB, and supervision to stand, currently unable to ambulate due to dizziness. Due to this dizziness with position changes, (which he describes as feeling anxious rather than vertigo) Dixhall pike and horiziontal roll tests were performed (modified and limited due to back pain). There was no nystagmus noted nor any subjective increase in dizziness reported. Saccadic tracking present. Pt currently with functional limitations due to the deficits listed below (see PT Problem List). Pt will benefit from skilled PT to increase their independence and safety with mobility to allow discharge to the venue listed below.          Recommendations for follow up therapy are one component of a multi-disciplinary discharge planning process, led by the attending physician.  Recommendations may be updated based on patient status, additional functional criteria and insurance authorization.  Follow Up Recommendations Home health PT      Assistance Recommended at Discharge Intermittent Supervision/Assistance  Patient can return home with the following  A little help with walking and/or transfers;A little help with bathing/dressing/bathroom;Assistance with cooking/housework;Assist for transportation    Equipment Recommendations None recommended by PT  Recommendations for Other Services       Functional Status  Assessment Patient has had a recent decline in their functional status and demonstrates the ability to make significant improvements in function in a reasonable and predictable amount of time.     Precautions / Restrictions Precautions Precautions: Fall Precaution Comments: monitor O2 Restrictions Weight Bearing Restrictions: No      Mobility  Bed Mobility Overal bed mobility: Needs Assistance Bed Mobility: Rolling, Sidelying to Sit, Sit to Sidelying Rolling: Supervision Sidelying to sit: Min assist     Sit to sidelying: Supervision General bed mobility comments: Min assist to rise to EOB due to back pain. Pt reports chronic and is able to mobilize himself at home. Rolls without assist but using rail today.    Transfers Overall transfer level: Needs assistance Equipment used: None Transfers: Sit to/from Stand Sit to Stand: Supervision           General transfer comment: Supervision for safety. limited by reported dizziness. Only tolerated about 1 min while BP checked    Ambulation/Gait               General Gait Details: Pt declined due to dizziness  Stairs            Wheelchair Mobility    Modified Rankin (Stroke Patients Only)       Balance Overall balance assessment: Needs assistance Sitting-balance support: No upper extremity supported, Feet supported Sitting balance-Leahy Scale: Good     Standing balance support: No upper extremity supported Standing balance-Leahy Scale: Fair                               Pertinent Vitals/Pain Pain Assessment Pain Assessment: 0-10 Pain Score: 3  Pain Location: back  Pain Descriptors / Indicators: Aching (chronic) Pain Intervention(s): Monitored during session, Repositioned    Home Living Family/patient expects to be discharged to:: Private residence Living Arrangements: Spouse/significant other Available Help at Discharge: Family;Available PRN/intermittently;Personal care  attendant Type of Home: Other(Comment) (Condo) Home Access: Elevator       Home Layout: One level Home Equipment: Cane - single Barista (2 wheels);Rollator (4 wheels) Additional Comments: Aides come in to help wife 7x/week for 5 hr    Prior Function Prior Level of Function : Independent/Modified Independent;History of Falls (last six months)             Mobility Comments: cares for wife who is in w/c       Hand Dominance   Dominant Hand: Right    Extremity/Trunk Assessment   Upper Extremity Assessment Upper Extremity Assessment: Defer to OT evaluation    Lower Extremity Assessment Lower Extremity Assessment: Generalized weakness (No focal deficits noted)       Communication   Communication: HOH  Cognition Arousal/Alertness: Awake/alert Behavior During Therapy: Anxious Overall Cognitive Status: Within Functional Limits for tasks assessed                                          General Comments General comments (skin integrity, edema, etc.): Reports feeling dizzy upon sitting up - denies vertigo but states very anxious and wants to lie down. No nystagmus noted with tracking however saccadic with tracking and motion sensitive cuasing increased dizziness. Supine BP 142/87, seated BP 137/76 - HR in 80s throughout, SPO2 98% on RA. Due to dizziness with positional changes, Dixhall pike and horiziontal roll tests performed (modified and limited due to back pain) No nystagmus noted nor any subjective increase in dizziness symptoms reported.    Exercises     Assessment/Plan    PT Assessment Patient needs continued PT services  PT Problem List Decreased strength;Decreased activity tolerance;Decreased range of motion;Decreased balance;Decreased mobility;Decreased knowledge of use of DME;Pain       PT Treatment Interventions DME instruction;Gait training;Functional mobility training;Therapeutic activities;Therapeutic exercise;Balance  training;Neuromuscular re-education;Patient/family education    PT Goals (Current goals can be found in the Care Plan section)  Acute Rehab PT Goals Patient Stated Goal: Feel better PT Goal Formulation: With patient Time For Goal Achievement: 02/02/23 Potential to Achieve Goals: Good    Frequency Min 3X/week     Co-evaluation               AM-PAC PT "6 Clicks" Mobility  Outcome Measure Help needed turning from your back to your side while in a flat bed without using bedrails?: None Help needed moving from lying on your back to sitting on the side of a flat bed without using bedrails?: A Little Help needed moving to and from a bed to a chair (including a wheelchair)?: A Little Help needed standing up from a chair using your arms (e.g., wheelchair or bedside chair)?: A Little Help needed to walk in hospital room?: A Little Help needed climbing 3-5 steps with a railing? : A Lot 6 Click Score: 18    End of Session   Activity Tolerance: Other (comment) (Limited by back pain and dizziness) Patient left: in bed;with call bell/phone within reach   PT Visit Diagnosis: Unsteadiness on feet (R26.81);History of falling (Z91.81);Muscle weakness (generalized) (M62.81);Difficulty in walking, not elsewhere classified (R26.2);Dizziness and giddiness (R42);Pain Pain - part of body:  (  back)    Time: [id number] PT Time Calculation (min) (ACUTE ONLY): 35 min   Charges:   PT Evaluation $PT Eval Low Complexity: 1 Low PT Treatments $Therapeutic Activity: 8-22 mins        Candie Mile, PT, DPT Physical Therapist Acute Rehabilitation Services [place]   Ellouise Newer 01/19/2023, 11:02 AM

## 2023-01-19 NOTE — H&P (Addendum)
History and Physical    Patient: Alexander Yoder [id number] DOB: [birth year] DOA: 01/19/2023 DOS: the patient was seen and examined on 01/19/2023 PCP: [name], MD  Patient coming from: Home  Chief Complaint:  Chief Complaint  Patient presents with   Nasal Congestion   Fall   HPI: Alexander Yoder is a 89 y.o. male with medical history significant of hyperlipidemia, pituitary tumor with panhypothyroidism, diabetes insipidus, history of prostate cancer, and lower back pain who presents after having a syncopal episode.  Patient notes that he started feeling unwell yesterday with runny nose, sore throat, and cough.  He normally has a cough, but denies having the other symptoms with the cough.  Associated symptoms included fever up to 103 F, nausea, and at least 1 episode of vomiting.  He reported that he kept feeling nauseous but was not able to really vomit because he had nothing on his stomach.  Emesis was noted to be nonbloody in appearance.   He was laying on the floor early this morning and and after getting up took a few steps and reported feeling lightheaded. Denies having any significant chest pain,  shortness of breath, or abdominal pain.  He was able to get down to 1 knee prior to losing consciousness.  Alexander Yoder currently denies any complaints of pain related to the fall.  Patient takes care of his wife who is disabled and wheelchair-bound.  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  In the emergency department patient was noted to be hypertensive with blood pressures elevated up to 170/72, and all other vital signs maintained.  Labs significant for lactic acid 2.5 and calcium 8.8 with ionized calcium 1.  COVID-19 screening was positive.  Chest x-ray noted no acute abnormality.  Urinalysis showed no significant signs of infection.   CT imaging of the head and cervical spine did not note any acute abnormality.  Patient has been given 1 L of IV fluids.  TRH called to admit.   Review of Systems: As mentioned in the history of present illness. All other systems reviewed and are negative. Past Medical History:  Diagnosis Date   Amblyopia of eye, right    Anxiety attack    Cancer ([place])    prostate   Exotropia of right eye    Glaucoma    Hyperlipidemia    Hypothyroidism    Pituitary tumor    Past Surgical History:  Procedure Laterality Date   COLONOSCOPY     EYE SURGERY Bilateral    cataracts removed   PROSTATECTOMY  2000   STRABISMUS SURGERY Right 09/24/2019   Procedure: REPAIR STRABISMUS RIGHT EYE;  Surgeon: Lamonte Sakai, MD;  Location: [facility];  Service: Ophthalmology;  Laterality: Right;   TONSILLECTOMY     TRANSPHENOIDAL / TRANSNASAL HYPOPHYSECTOMY / RESECTION PITUITARY TUMOR  1998, 2010   x2 -   WISDOM TOOTH EXTRACTION     Social History:  reports that he has never smoked. He has never used smokeless tobacco. He reports that he does not drink alcohol and does not use drugs.  Allergies  Allergen Reactions   Peanut-Containing Drug Products Anaphylaxis   Penicillins Other (See Comments)    Did it involve swelling of the face/tongue/throat, SOB, or low BP? Yes Did it involve sudden or severe rash/hives, skin peeling, or any  reaction on the inside of your mouth or nose? Yes Did you need to seek medical attention at a hospital or doctor's office? Yes When did it last happen?     2010  If all above answers are "NO", may proceed with cephalosporin use.    Rosuvastatin Calcium     Other Reaction(s): leg pains    No family history on file.  Prior to Admission medications   Medication Sig Start Date End Date Taking? Authorizing Provider  acetaminophen (TYLENOL) 325 MG tablet Take 2 tablets (650 mg total) by mouth every 6 (six) hours as needed for mild pain (or Fever >/= 101). 09/10/22  Yes [name], [name],  MD  aspirin EC 81 MG tablet Take 81 mg by mouth daily.   Yes [provider]  B Complex-C (B-COMPLEX WITH VITAMIN C) tablet Take 1 tablet by mouth daily.   Yes [provider]  brimonidine (ALPHAGAN) 0.15 % ophthalmic solution Place 1 drop into the right eye 2 (two) times daily.   Yes [provider]  Cholecalciferol (VITAMIN D) 50 MCG (2000 UT) CAPS Take 2,000 Units by mouth daily.   Yes [provider]  clopidogrel (PLAVIX) 75 MG tablet Take 75 mg by mouth daily. 03/29/21  Yes [provider]  COMBIGAN 0.2-0.5 % ophthalmic solution Place 1 drop into the left eye 2 (two) times daily. 08/20/22  Yes [provider]  desmopressin (DDAVP) 0.1 MG tablet Take 1 tablet (0.1 mg total) by mouth 2 (two) times daily. 09/10/22  Yes [name], [name], MD  latanoprost (XALATAN) 0.005 % ophthalmic solution Place 1 drop into both eyes at bedtime.   Yes [provider]  Omega-3 Fatty Acids (FISH OIL) 1000 MG CAPS Take 1,000 mg by mouth daily.   Yes [provider]  SYNTHROID 112 MCG tablet Take 112 mcg by mouth daily before breakfast.  07/29/19  Yes [provider]  testosterone cypionate (DEPOTESTOSTERONE CYPIONATE) 200 MG/ML injection Inject 0.7 mLs into the muscle every 14 (fourteen) days. 10/31/20  Yes [provider]  Turmeric (QC TUMERIC COMPLEX PO) Take 1 capsule by mouth daily.   Yes [provider]  hydrocortisone (CORTEF) 10 MG tablet Take  2 tablets ('20mg'$ ) by mouth in the morning, and 1 tablet ('10mg'$ ) in the evening. Patient taking differently: Take 5-10 mg by mouth See admin instructions. Take  1 tablets ('10mg'$ ) by mouth in the morning, and 1/2 tablet ('5mg'$ ) in the evening. 09/10/22   [name], [name], MD  ondansetron (ZOFRAN) 4 MG tablet Take 1 tablet (4 mg total) by mouth every 8 (eight) hours as needed for nausea or vomiting. Patient not taking: Reported on 01/19/2023 10/20/21   Carvel Getting, NP     Physical Exam: Vitals:   01/19/23 0509 01/19/23 0510 01/19/23 0517 01/19/23 0545  BP:  126/89 (!) 145/80 (!) 170/72  Pulse:  90 90 91  Resp:  '16 16 16  '$ Temp:  97.6 F (36.4 C) 97.6 F (36.4 C)   TempSrc:   Oral   SpO2:  97% 98% 100%  Weight: 83 kg     Height: '5\' 10"'$  (1.778 m)       Constitutional: Elderly male who appears to be ill but in no acute distress and able to follow commands Eyes: PERRL, lids and conjunctivae normal ENMT: Mucous membranes are moist and patient has green dried emesis on the sides of his mouth. Neck: normal, supple Respiratory: clear to auscultation bilaterally, no wheezing, no crackles. Normal  respiratory effort.  Able to talk in complete sentences on room air. Cardiovascular: Regular rate and rhythm, no murmurs / rubs / gallops. No extremity edema.  Abdomen: no tenderness, no masses palpated. Bowel sounds positive.  Musculoskeletal: no clubbing / cyanosis.  Decreased range of motion due to lower back pain Skin: no rashes, lesions, ulcers.  Poor skin turgor.   Neurologic: CN 2-12 grossly intact. Strength 4+/5 in all 4.  Psychiatric: Normal judgment and insight. Alert and oriented x 3. Normal mood.   Data Reviewed:  Sinus rhythm at 93 bpm with first-degree heart block.  Reviewed labs, imaging, and pertinent records as noted above in HPI.  Assessment and Plan: Syncope and collapse Acute.  Patient reported having syncopal episode prior to arrival.  He was getting up off the floor at the time and took a few steps prior to passing out.  Patient reports that he was able to get down on 1 knee prior to losing consciousness and denied any complaints of pain related to the fall.  CT scan of the head and cervical spine did not note any acute abnormalities.  X-rays of the chest and pelvis were also negative. -Admit to a telemetry bed -Check orthostatic vital signs -Physical therapy to evaluate and treat in regards to safety with ambulation -Follow-up telemetry  monitoring  COVID-19 infection Acute.  Patient presented with complaints of fever, cough, and congestion.  Chest x-ray did not note any acute abnormality.  Screening test positive for COVID-19 -COVID-19 focused order set utilized -Airborne precautions -Paxlovid -Mucinex  Lactic acidosis Acute.  Suspect secondary to the patient being acutely dehydrated with reports of vomiting and fever. -Trend lactic acid level  Hypocalcemia Acute.  Initial calcium 8.8 with ionized calcium 1. 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  CT imaging of the brain made note of known pituitary mass with changes of transsphenoidal hypophysectomy. Tumor extends into the sphenoid sinuses. -Check TSH, free T4 -Continue levothyroxine, DDAVP, hydrocortisone -Resume testosterone injections in the outpatient setting  Chronic diastolic CHF Last echocardiogram from 2018 noted EF to be 60 to 65% with grade 1 diastolic dysfunction at that time.  Patient appears to be euvolemic  Polycythemia Chronic.  Hemoglobin 17.9 which appears similar to priors.  Patient on testosterone injections which could be a possible cause. -Continue to monitor   DVT prophylaxis: Lovenox Advance Care Planning:   Code Status: Full Code    Consults: None  Family Communication: None requested  Severity of Illness: The appropriate patient status for this patient is OBSERVATION. Observation status is judged to be reasonable and necessary in order to provide the required intensity of service to ensure the patient's safety. The patient's presenting symptoms, physical exam findings, and initial radiographic and laboratory data in the context of their medical condition is felt to place them at decreased risk for further clinical deterioration. Furthermore, it is  anticipated that the patient will be medically stable for discharge from the hospital within 2 midnights of admission.   Author: Norval Morton, MD 01/19/2023 7:56 AM  For on call review [email or web address].

## 2023-01-20 DIAGNOSIS — Z9842 Cataract extraction status, left eye: Secondary | ICD-10-CM | POA: Diagnosis not present

## 2023-01-20 DIAGNOSIS — Z8546 Personal history of malignant neoplasm of prostate: Secondary | ICD-10-CM | POA: Diagnosis not present

## 2023-01-20 DIAGNOSIS — Z7982 Long term (current) use of aspirin: Secondary | ICD-10-CM | POA: Diagnosis not present

## 2023-01-20 DIAGNOSIS — Z7952 Long term (current) use of systemic steroids: Secondary | ICD-10-CM | POA: Diagnosis not present

## 2023-01-20 DIAGNOSIS — I5032 Chronic diastolic (congestive) heart failure: Secondary | ICD-10-CM | POA: Diagnosis present

## 2023-01-20 DIAGNOSIS — Z7902 Long term (current) use of antithrombotics/antiplatelets: Secondary | ICD-10-CM | POA: Diagnosis not present

## 2023-01-20 DIAGNOSIS — E273 Drug-induced adrenocortical insufficiency: Secondary | ICD-10-CM | POA: Diagnosis present

## 2023-01-20 DIAGNOSIS — H409 Unspecified glaucoma: Secondary | ICD-10-CM | POA: Diagnosis present

## 2023-01-20 DIAGNOSIS — R7989 Other specified abnormal findings of blood chemistry: Secondary | ICD-10-CM | POA: Diagnosis present

## 2023-01-20 DIAGNOSIS — Z9841 Cataract extraction status, right eye: Secondary | ICD-10-CM | POA: Diagnosis not present

## 2023-01-20 DIAGNOSIS — Z888 Allergy status to other drugs, medicaments and biological substances status: Secondary | ICD-10-CM | POA: Diagnosis not present

## 2023-01-20 DIAGNOSIS — E785 Hyperlipidemia, unspecified: Secondary | ICD-10-CM | POA: Diagnosis present

## 2023-01-20 DIAGNOSIS — E039 Hypothyroidism, unspecified: Secondary | ICD-10-CM | POA: Diagnosis present

## 2023-01-20 DIAGNOSIS — Z79899 Other long term (current) drug therapy: Secondary | ICD-10-CM | POA: Diagnosis not present

## 2023-01-20 DIAGNOSIS — F411 Generalized anxiety disorder: Secondary | ICD-10-CM | POA: Diagnosis present

## 2023-01-20 DIAGNOSIS — Z79890 Hormone replacement therapy (postmenopausal): Secondary | ICD-10-CM | POA: Diagnosis not present

## 2023-01-20 DIAGNOSIS — E232 Diabetes insipidus: Secondary | ICD-10-CM | POA: Diagnosis present

## 2023-01-20 DIAGNOSIS — D751 Secondary polycythemia: Secondary | ICD-10-CM | POA: Diagnosis present

## 2023-01-20 DIAGNOSIS — T380X5A Adverse effect of glucocorticoids and synthetic analogues, initial encounter: Secondary | ICD-10-CM | POA: Diagnosis present

## 2023-01-20 DIAGNOSIS — E230 Hypopituitarism: Secondary | ICD-10-CM | POA: Diagnosis present

## 2023-01-20 DIAGNOSIS — Z91010 Allergy to peanuts: Secondary | ICD-10-CM | POA: Diagnosis not present

## 2023-01-20 DIAGNOSIS — Z880 Allergy status to penicillin: Secondary | ICD-10-CM | POA: Diagnosis not present

## 2023-01-20 DIAGNOSIS — U071 COVID-19: Secondary | ICD-10-CM | POA: Diagnosis present

## 2023-01-20 DIAGNOSIS — E860 Dehydration: Secondary | ICD-10-CM | POA: Diagnosis present

## 2023-01-20 LAB — T4, FREE: Free T4: 1.13 ng/dL — ABNORMAL HIGH (ref 0.61–1.12)

## 2023-01-20 LAB — CBC
HCT: 48.4 % (ref 39.0–52.0)
Hemoglobin: 17 g/dL (ref 13.0–17.0)
MCH: 28.8 pg (ref 26.0–34.0)
MCHC: 35.1 g/dL (ref 30.0–36.0)
MCV: 82 fL (ref 80.0–100.0)
Platelets: 207 10*3/uL (ref 150–400)
RBC: 5.9 MIL/uL — ABNORMAL HIGH (ref 4.22–5.81)
RDW: 14.1 % (ref 11.5–15.5)
WBC: 8 10*3/uL (ref 4.0–10.5)
nRBC: 0 % (ref 0.0–0.2)

## 2023-01-20 LAB — BASIC METABOLIC PANEL
Anion gap: 10 (ref 5–15)
BUN: 12 mg/dL (ref 8–23)
CO2: 21 mmol/L — ABNORMAL LOW (ref 22–32)
Calcium: 8.7 mg/dL — ABNORMAL LOW (ref 8.9–10.3)
Chloride: 102 mmol/L (ref 98–111)
Creatinine, Ser: 1.1 mg/dL (ref 0.61–1.24)
GFR, Estimated: >60 mL/min (ref >60)
Glucose, Bld: 68 mg/dL — ABNORMAL LOW (ref 70–99)
Potassium: 4.3 mmol/L (ref 3.5–5.1)
Sodium: 133 mmol/L — ABNORMAL LOW (ref 135–145)

## 2023-01-20 LAB — MAGNESIUM: Magnesium: 2.1 mg/dL (ref 1.7–2.4)

## 2023-01-20 LAB — BRAIN NATRIURETIC PEPTIDE: B Natriuretic Peptide: 54.8 pg/mL (ref 0.0–100.0)

## 2023-01-20 LAB — C-REACTIVE PROTEIN: CRP: 8.2 mg/dL — ABNORMAL HIGH (ref <1.0)

## 2023-01-20 LAB — TSH: TSH: 0.133 u[IU]/mL — ABNORMAL LOW (ref 0.350–4.500)

## 2023-01-20 MED ORDER — LEVOTHYROXINE SODIUM 100 MCG PO TABS
100.0000 ug | ORAL_TABLET | Freq: Every day | ORAL | Status: DC
  Administered 2023-01-21: 100 ug via ORAL
  Filled 2023-01-20: qty 1

## 2023-01-20 MED ORDER — METHYLPREDNISOLONE SODIUM SUCC 125 MG IJ SOLR
60.0000 mg | INTRAMUSCULAR | Status: DC
  Administered 2023-01-20 – 2023-01-21 (×2): 60 mg via INTRAVENOUS
  Filled 2023-01-20 (×2): qty 2

## 2023-01-20 MED ORDER — SODIUM CHLORIDE 0.9 % IV SOLN: INTRAVENOUS | Status: DC

## 2023-01-20 NOTE — Progress Notes (Signed)
PROGRESS NOTE                                                                                                                                                                                                             Patient Demographics:    Alexander Yoder, is a 89 y.o. male, DOB - 04-04-1930, [id number]  Outpatient Primary MD for the patient is [name], MD    LOS - 0  Admit date - 01/19/2023    Chief Complaint  Patient presents with   Nasal Congestion   Fall       Brief Narrative (HPI from H&P)    89 y.o. 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  Patient has been having a dry cough, low-grade fevers, poor appetite poor oral intake for 2 to 3 days, in the ER was found to have acute COVID-19 infection along with clinical dehydration related of adrenal insufficiency and admitted to the hospital.   Subjective:    Lyna Poser today has, No headache, No chest pain, No abdominal pain - No Nausea, No new weakness tingling or numbness, No loss of breath, does have poor appetite and generalized weakness.   Assessment  & Plan :    Syncope and collapse.  This is due to acute COVID-19 infection in a patient with adrenal insufficiency on chronic steroids causing relative adrenal decompensation and adrenal insufficiency with poor oral intake and clinical dehydration.  He thankfully does not have any significant pulmonary symptoms, x-rays negative, he will be hydrated with IV fluids, added I-S and flutter valve for pulmonary toiletry, stress dose IV steroids, PT OT and monitor.  Since he has already been started on Paxlovid for now we will continue.  Will monitor inflammatory markers closely.  Panhypopituitarism due to  pituitary mass, history of adrenal insufficiency, hypothyroidism and central DI.  TSH is somewhat suppressed, check free T4, may require reduction of Synthroid dose, continue DDAVP, stress dose steroids.  Outpatient testosterone injections and endocrine follow-up.  History of chronic diastolic CHF.  EF 65% on last echocardiogram.  Currently dehydrated gentle hydration.  Polycythemia.  Due to dehydration hydrate and monitor.  Note he is also on chronic steroids  and testosterone.  PCP to monitor.  Continue dual antiplatelet therapy which he takes at home.      Condition - Fair  Family Communication  :  None  Code Status :  Full  Consults  :  None  PUD Prophylaxis :    Procedures  :            Disposition Plan  :    Status is: Observation   DVT Prophylaxis  :    enoxaparin (LOVENOX) injection 40 mg Start: 01/19/23 1000   Lab Results  Component Value Date   PLT 207 01/20/2023    Diet :  Diet Order             Diet Heart Room service appropriate? Yes; Fluid consistency: Thin  Diet effective now                    Inpatient Medications  Scheduled Meds:  aspirin EC  81 mg Oral Daily   brimonidine  1 drop Left Eye BID   And   timolol  1 drop Left Eye BID   clopidogrel  75 mg Oral Daily   desmopressin  0.1 mg Oral BID   enoxaparin (LOVENOX) injection  40 mg Subcutaneous Q24H   guaiFENesin  600 mg Oral BID   latanoprost  1 drop Both Eyes QHS   levothyroxine  112 mcg Oral QAC breakfast   methylPREDNISolone (SOLU-MEDROL) injection  60 mg Intravenous Q24H   multivitamin with minerals  1 tablet Oral Daily   nirmatrelvir/ritonavir (renal dosing)  2 tablet Oral BID   sodium chloride flush  3 mL Intravenous Q12H   Continuous Infusions:  sodium chloride     PRN Meds:.acetaminophen **OR** acetaminophen, albuterol, guaiFENesin-dextromethorphan, melatonin, ondansetron **OR** ondansetron (ZOFRAN) IV, phenol  Antibiotics  :    Anti-infectives (From admission,  onward)    Start     Dose/Rate Route Frequency Ordered Stop   01/19/23 1000  nirmatrelvir/ritonavir (PAXLOVID) 3 tablet  Status:  Discontinued        3 tablet Oral 2 times daily 01/19/23 0755 01/19/23 0813   01/19/23 1000  nirmatrelvir/ritonavir (renal dosing) (PAXLOVID) 2 tablet        2 tablet Oral 2 times daily 01/19/23 0813 01/24/23 0959         Objective:   Vitals:   01/19/23 1112 01/19/23 1956 01/20/23 0000 01/20/23 0400  BP: 139/81 (!) 163/85 120/66 119/78  Pulse:  82 67 72  Resp:  '18 13 17  '$ Temp: 97.7 F (36.5 C) 99.3 F (37.4 C) 98.2 F (36.8 C) 98.5 F (36.9 C)  TempSrc: Axillary Oral Oral   SpO2:  96% 95% (!) 89%  Weight:      Height:        Wt Readings from Last 3 Encounters:  01/19/23 83 kg  09/10/22 82.6 kg  04/18/21 79.8 kg     Intake/Output Summary (Last 24 hours) at 01/20/2023 0937 Last data filed at 01/19/2023 1710 Gross per 24 hour  Intake --  Output 800 ml  Net -800 ml     Physical Exam  Awake Alert, R sided weakness [place].AT,PERRAL Supple Neck, No JVD,   Symmetrical Chest wall movement, Good air movement bilaterally, CTAB RRR,No Gallops,Rubs or new Murmurs,  +ve B.Sounds, Abd Soft, No tenderness,   No Cyanosis, Clubbing or edema      Data Review:    Recent Labs  Lab 01/19/23 0544 01/19/23 0608 01/20/23 0613  WBC 8.2  --  8.0  HGB 17.9* 18.0* 17.0  HCT 53.3* 53.0* 48.4  PLT 198  --  207  MCV 85.8  --  82.0  MCH 28.8  --  28.8  MCHC 33.6  --  35.1  RDW 14.0  --  14.1  LYMPHSABS 1.3  --   --   MONOABS 1.1*  --   --   EOSABS 0.2  --   --   BASOSABS 0.1  --   --     Recent Labs  Lab 01/19/23 0544 01/19/23 0608 01/19/23 0748 01/19/23 0800  NA 135 136  --   --   K 3.8 3.8  --   --   CL 101 100  --   --   CO2 24  --   --   --   ANIONGAP 10  --   --   --   GLUCOSE 87 83  --   --   BUN 9 10  --   --   CREATININE 1.22 1.10  --   --   AST 31  --   --   --   ALT 29  --   --   --   ALKPHOS 69  --   --   --   BILITOT 1.0   --   --   --   ALBUMIN 3.8  --   --   --   LATICACIDVEN 2.5*  --  1.5  --   INR 1.1  --   --   --   TSH  --   --   --  0.037*  CALCIUM 8.8*  --   --   --        Radiology Reports CT Head Wo Contrast  Result Date: 01/19/2023 CLINICAL DATA:  Head and neck trauma EXAM: CT HEAD WITHOUT CONTRAST CT CERVICAL SPINE WITHOUT CONTRAST TECHNIQUE: Multidetector CT imaging of the head and cervical spine was performed following the standard protocol without intravenous contrast. Multiplanar CT image reconstructions of the cervical spine were also generated. RADIATION DOSE REDUCTION: This exam was performed according to the departmental dose-optimization program which includes automated exposure control, adjustment of the mA and/or kV according to patient size and/or use of iterative reconstruction technique. COMPARISON:  09/06/2022 FINDINGS: CT HEAD FINDINGS Brain: No evidence of acute infarction, hemorrhage, hydrocephalus, extra-axial collection or mass effect. Known pituitary mass with changes of transsphenoidal hypophysectomy. Tumor extends into the sphenoid sinuses. Recent MRI characterization 10/07/2022. Vascular: No hyperdense vessel or unexpected calcification. Skull: Negative for fracture Sinuses/Orbits: No evidence of injury. Postoperative changes of transsphenoidal access. CT CERVICAL SPINE FINDINGS Alignment: Normal. Skull base and vertebrae: No acute fracture. No primary bone lesion or focal pathologic process. Soft tissues and spinal canal: No prevertebral fluid or swelling. No visible canal hematoma. Disc levels: Generalized disc space narrowing and degenerative spurring. Up to mild spinal stenosis at C5-6. Upper chest: Negative IMPRESSION: 1. No evidence of acute intracranial or cervical spine injury. 2. Chronic findings are described above. Electronically Signed   By: Jorje Guild M.D.   On: 01/19/2023 06:29   CT Cervical Spine Wo Contrast  Result Date: 01/19/2023 CLINICAL DATA:  Head and neck  trauma EXAM: CT HEAD WITHOUT CONTRAST CT CERVICAL SPINE WITHOUT CONTRAST TECHNIQUE: Multidetector CT imaging of the head and cervical spine was performed following the standard protocol without intravenous contrast. Multiplanar CT image reconstructions of the cervical spine were also generated. RADIATION DOSE REDUCTION: This exam was performed according to the departmental dose-optimization  program which includes automated exposure control, adjustment of the mA and/or kV according to patient size and/or use of iterative reconstruction technique. COMPARISON:  09/06/2022 FINDINGS: CT HEAD FINDINGS Brain: No evidence of acute infarction, hemorrhage, hydrocephalus, extra-axial collection or mass effect. Known pituitary mass with changes of transsphenoidal hypophysectomy. Tumor extends into the sphenoid sinuses. Recent MRI characterization 10/07/2022. Vascular: No hyperdense vessel or unexpected calcification. Skull: Negative for fracture Sinuses/Orbits: No evidence of injury. Postoperative changes of transsphenoidal access. CT CERVICAL SPINE FINDINGS Alignment: Normal. Skull base and vertebrae: No acute fracture. No primary bone lesion or focal pathologic process. Soft tissues and spinal canal: No prevertebral fluid or swelling. No visible canal hematoma. Disc levels: Generalized disc space narrowing and degenerative spurring. Up to mild spinal stenosis at C5-6. Upper chest: Negative IMPRESSION: 1. No evidence of acute intracranial or cervical spine injury. 2. Chronic findings are described above. Electronically Signed   By: Jorje Guild M.D.   On: 01/19/2023 06:29   DG Chest Port 1 View  Result Date: 01/19/2023 CLINICAL DATA:  Syncope with cough . EXAM: PORTABLE CHEST 1 VIEW COMPARISON:  09/07/2022 FINDINGS: The lungs are clear without focal pneumonia, edema, pneumothorax or pleural effusion. The cardiopericardial silhouette is within normal limits for size. The visualized bony structures of the thorax are  unremarkable. Telemetry leads overlie the chest. IMPRESSION: No active disease. Electronically Signed   By: Misty Stanley M.D.   On: 01/19/2023 05:47   DG Pelvis Portable  Result Date: 01/19/2023 CLINICAL DATA:  Syncope and fall. EXAM: PORTABLE PELVIS 1-2 VIEWS COMPARISON:  05/31/2021. FINDINGS: No evidence for an acute fracture. Surgical clips in the pelvis again noted. SI joints and symphysis pubis unremarkable. IMPRESSION: Negative. Electronically Signed   By: Misty Stanley M.D.   On: 01/19/2023 05:46      Signature  -   Lala Lund M.D on 01/20/2023 at 9:37 AM   -  To page go to [email or web address]

## 2023-01-21 ENCOUNTER — Other Ambulatory Visit (HOSPITAL_COMMUNITY): Payer: Self-pay

## 2023-01-21 DIAGNOSIS — U071 COVID-19: Secondary | ICD-10-CM | POA: Diagnosis not present

## 2023-01-21 LAB — CBC WITH DIFFERENTIAL/PLATELET
Abs Immature Granulocytes: 0.04 10*3/uL (ref 0.00–0.07)
Basophils Absolute: 0 10*3/uL (ref 0.0–0.1)
Basophils Relative: 0 %
Eosinophils Absolute: 0 10*3/uL (ref 0.0–0.5)
Eosinophils Relative: 0 %
HCT: 50.7 % (ref 39.0–52.0)
Hemoglobin: 17.7 g/dL — ABNORMAL HIGH (ref 13.0–17.0)
Immature Granulocytes: 1 %
Lymphocytes Relative: 11 %
Lymphs Abs: 0.9 10*3/uL (ref 0.7–4.0)
MCH: 28.7 pg (ref 26.0–34.0)
MCHC: 34.9 g/dL (ref 30.0–36.0)
MCV: 82.3 fL (ref 80.0–100.0)
Monocytes Absolute: 0.2 10*3/uL (ref 0.1–1.0)
Monocytes Relative: 3 %
Neutro Abs: 7 10*3/uL (ref 1.7–7.7)
Neutrophils Relative %: 85 %
Platelets: 258 10*3/uL (ref 150–400)
RBC: 6.16 MIL/uL — ABNORMAL HIGH (ref 4.22–5.81)
RDW: 13.8 % (ref 11.5–15.5)
WBC: 8.2 10*3/uL (ref 4.0–10.5)
nRBC: 0 % (ref 0.0–0.2)

## 2023-01-21 LAB — BRAIN NATRIURETIC PEPTIDE: B Natriuretic Peptide: 69.6 pg/mL (ref 0.0–100.0)

## 2023-01-21 LAB — BASIC METABOLIC PANEL
Anion gap: 9 (ref 5–15)
BUN: 18 mg/dL (ref 8–23)
CO2: 22 mmol/L (ref 22–32)
Calcium: 9.2 mg/dL (ref 8.9–10.3)
Chloride: 103 mmol/L (ref 98–111)
Creatinine, Ser: 1.14 mg/dL (ref 0.61–1.24)
GFR, Estimated: >60 mL/min (ref >60)
Glucose, Bld: 128 mg/dL — ABNORMAL HIGH (ref 70–99)
Potassium: 4.6 mmol/L (ref 3.5–5.1)
Sodium: 134 mmol/L — ABNORMAL LOW (ref 135–145)

## 2023-01-21 LAB — C-REACTIVE PROTEIN: CRP: 13.9 mg/dL — ABNORMAL HIGH (ref <1.0)

## 2023-01-21 LAB — MAGNESIUM: Magnesium: 2.4 mg/dL (ref 1.7–2.4)

## 2023-01-21 MED ORDER — PREDNISONE 5 MG PO TABS
ORAL_TABLET | ORAL | 0 refills | Status: AC
  Filled 2023-01-21: qty 42, 6d supply, fill #0

## 2023-01-21 MED ORDER — LEVOTHYROXINE SODIUM 100 MCG PO TABS
100.0000 ug | ORAL_TABLET | Freq: Every day | ORAL | 0 refills | Status: DC
  Filled 2023-01-21: qty 30, 30d supply, fill #0

## 2023-01-21 MED ORDER — ALBUTEROL SULFATE HFA 108 (90 BASE) MCG/ACT IN AERS
2.0000 | INHALATION_SPRAY | Freq: Four times a day (QID) | RESPIRATORY_TRACT | 0 refills | Status: AC | PRN
  Filled 2023-01-21: qty 6.7, 25d supply, fill #0

## 2023-01-21 MED ORDER — HYDROCORTISONE 10 MG PO TABS: ORAL_TABLET | ORAL | 0 refills | Status: AC

## 2023-01-21 MED ORDER — NIRMATRELVIR/RITONAVIR (PAXLOVID) TABLET (RENAL DOSING)
2.0000 | ORAL_TABLET | Freq: Two times a day (BID) | ORAL | 0 refills | Status: AC
  Filled 2023-01-21: qty 12, 3d supply, fill #0

## 2023-01-21 NOTE — TOC CAGE-AID Note (Signed)
Transition of Care [facility]) - CAGE-AID Screening   Patient Details  Name: Alexander Yoder MRN: [id number] Date of Birth: [birth year]  Transition of Care [facility]) CM/SW Contact:    Clovis Cao, RN Phone Number: [phone] 01/21/2023, 9:44 AM   Clinical Narrative: Pt here after having a syncopal episode at home.  Pt tested positive for COVID.  Pt denies alcohol or drug use.  Screening complete. No resources needed.   CAGE-AID Screening:    Have You Ever Felt You Ought to Cut Down on Your Drinking or Drug Use?: No Have People Annoyed You By Critizing Your Drinking Or Drug Use?: No Have You Felt Bad Or Guilty About Your Drinking Or Drug Use?: No Have You Ever Had a Drink or Used Drugs First Thing In The Morning to Steady Your Nerves or to Get Rid of a Hangover?: No CAGE-AID Score: 0  Substance Abuse Education Offered: No

## 2023-01-21 NOTE — Progress Notes (Addendum)
Discharge paperwork reviewed with pt. Pt verbalized understanding. Pt's daughter in law will transport pt back home. Pt will be picked up at 11am.   Update 11am: Waiting for pt's TOC meds to be delivered from pharmacy.

## 2023-01-21 NOTE — Discharge Summary (Signed)
Alexander Yoder [id number] DOB: 08/19/1930 DOA: 01/19/2023  PCP: [name], MD  Admit date: 01/19/2023  Discharge date: 01/21/2023  Admitted From: Home   Disposition:  Home   Recommendations for Outpatient Follow-up:   Follow up with PCP in 1-2 weeks  PCP Please obtain BMP/CBC, 2 view CXR in 1week,  (see Discharge instructions)   PCP Please follow up on the following pending results: Check CBC, BMP, SH, free T4 magnesium and a two-view chest x-ray in 7 to 10 days.   Home Health: Pt   Equipment/Devices: walker  Consultations: None  Discharge Condition: Stable    CODE STATUS: Full    Diet Recommendation: Heart Healthy     Chief Complaint  Patient presents with   Nasal Congestion   Fall     Brief history of present illness from the day of admission and additional interim summary     89 y.o. 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  Patient has been having a dry cough, low-grade fevers, poor appetite poor oral intake for 2 to 3 days, in the ER was found to have acute COVID-19 infection along with clinical dehydration related of adrenal insufficiency and admitted to the hospital.                                                                  Hospital Course   Syncope and collapse.  This is due to acute COVID-19 infection in a patient with adrenal insufficiency on chronic steroids causing relative adrenal decompensation and adrenal insufficiency with poor oral intake and clinical dehydration.  He thankfully does not have any significant pulmonary symptoms, x-rays negative, he will be hydrated with IV  fluids, added I-S and flutter valve for pulmonary toiletry, improved after rest or with IV steroids and IV fluids, close to baseline now eager to go home.  Will be discharged home on oral steroid taper at higher dose for the next 1 week thereafter his home oral steroids to be resumed, finished Paxlovid course.  Follow-up with PCP within a week.   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  TSH is somewhat suppressed likely due to underlying panhypopituitarism however he also had a low free T4, have reduced his home Synthroid dose, continue DDAVP, stress dose steroids.  Outpatient testosterone injections and endocrine follow-up.  PCP to monitor free T4 levels.   History of chronic diastolic CHF.  EF 65% on last echocardiogram.  Dehydrated upon admission now stable after IV fluids.   Acute on chronic polycythemia.  Due to dehydration hydrate and monitor.  Note he is also on chronic  steroids and testosterone.  PCP to monitor.  Continue dual antiplatelet therapy which he takes at home.   Discharge diagnosis     Principal Problem:   COVID-19 Active Problems:   Syncope and collapse   Lactic acidosis   Hypocalcemia   Hypothyroidism   Panhypopituitarism (HCC)   Adrenal insufficiency (HCC)   Diabetes insipidus (HCC)   Chronic diastolic CHF (congestive heart failure) ([place])   Polycythemia    Discharge instructions    Discharge Instructions     Diet - low sodium heart healthy   Complete by: As directed    Discharge instructions   Complete by: As directed    Follow with Primary MD [name], MD in 7 days   Get CBC, CMP, Magnesium, TSH, free T4, 2 view Chest X ray -  checked next visit with your primary MD   Activity: As tolerated with Full fall precautions use walker/cane & assistance as needed  Disposition Home     Diet: Heart Healthy    Special Instructions: If you have smoked or chewed Tobacco  in the last 2  yrs please stop smoking, stop any regular Alcohol  and or any Recreational drug use.  On your next visit with your primary care physician please Get Medicines reviewed and adjusted.  Please request your Prim.MD to go over all Hospital Tests and Procedure/Radiological results at the follow up, please get all Hospital records sent to your Prim MD by signing hospital release before you go home.  If you experience worsening of your admission symptoms, develop shortness of breath, life threatening emergency, suicidal or homicidal thoughts you must seek medical attention immediately by calling 911 or calling your MD immediately  if symptoms less severe.  You Must read complete instructions/literature along with all the possible adverse reactions/side effects for all the Medicines you take and that have been prescribed to you. Take any new Medicines after you have completely understood and accpet all the possible adverse reactions/side effects.   Increase activity slowly   Complete by: As directed    MyChart COVID-19 home monitoring program   Complete by: Jan 21, 2023    Is the patient willing to use the [place] for home monitoring?: Yes   Temperature monitoring   Complete by: Jan 21, 2023    After how many days would you like to receive a notification of this patient's flowsheet entries?: 1       Discharge Medications   Allergies as of 01/21/2023       Reactions   Peanut-containing Drug Products Anaphylaxis   Penicillins Other (See Comments)   Did it involve swelling of the face/tongue/throat, SOB, or low BP? Yes Did it involve sudden or severe rash/hives, skin peeling, or any reaction on the inside of your mouth or nose? Yes Did you need to seek medical attention at a hospital or doctor's office? Yes When did it last happen?     2010  If all above answers are "NO", may proceed with cephalosporin use.   Rosuvastatin Calcium    Other Reaction(s): leg pains        Medication  List     STOP taking these medications    ondansetron 4 MG tablet Commonly known as: Zofran       TAKE these medications    acetaminophen 325 MG tablet Commonly known as: TYLENOL Take 2 tablets (650 mg total) by mouth every 6 (six) hours as needed for mild pain (or Fever >/= 101).  albuterol 108 (90 Base) MCG/ACT inhaler Commonly known as: VENTOLIN HFA Inhale 2 puffs into the lungs every 6 (six) hours as needed for wheezing or shortness of breath.   aspirin EC 81 MG tablet Take 81 mg by mouth daily.   B-complex with vitamin C tablet Take 1 tablet by mouth daily.   brimonidine 0.15 % ophthalmic solution Commonly known as: ALPHAGAN Place 1 drop into the right eye 2 (two) times daily.   clopidogrel 75 MG tablet Commonly known as: PLAVIX Take 75 mg by mouth daily.   Combigan 0.2-0.5 % ophthalmic solution Generic drug: brimonidine-timolol Place 1 drop into the left eye 2 (two) times daily.   desmopressin 0.1 MG tablet Commonly known as: DDAVP Take 1 tablet (0.1 mg total) by mouth 2 (two) times daily.   Fish Oil 1000 MG Caps Take 1,000 mg by mouth daily.   hydrocortisone 10 MG tablet Commonly known as: CORTEF Take  2 tablets ('20mg'$ ) by mouth in the morning, and 1 tablet ('10mg'$ ) in the evening. Start taking on: January 27, 2023 What changed: These instructions start on January 27, 2023. If you are unsure what to do until then, ask your doctor or other care provider.   latanoprost 0.005 % ophthalmic solution Commonly known as: XALATAN Place 1 drop into both eyes at bedtime.   levothyroxine 100 MCG tablet Commonly known as: SYNTHROID Take 1 tablet (100 mcg total) by mouth daily before breakfast. Start taking on: January 22, 2023 What changed:  medication strength how much to take   nirmatrelvir/ritonavir (renal dosing) 10 x 150 MG & 10 x '100MG'$  Tabs Commonly known as: PAXLOVID Take 2 tablets by mouth 2 (two) times daily for 3 days. Take nirmatrelvir (150 mg) one tablet  twice daily for 3 days and ritonavir (100 mg) one tablet twice daily for 3 days.   predniSONE 5 MG tablet Commonly known as: DELTASONE Label  & dispense according to the schedule below. take 8 Pills PO for 3 days, 6 Pills PO for 3 days, then resume you home Cortef. Total  50 pills.   QC TUMERIC COMPLEX PO Take 1 capsule by mouth daily.   testosterone cypionate 200 MG/ML injection Commonly known as: DEPOTESTOSTERONE CYPIONATE Inject 0.7 mLs into the muscle every 14 (fourteen) days.   Vitamin D 50 MCG (2000 UT) Caps Take 2,000 Units by mouth daily.               Durable Medical Equipment  (From admission, onward)           Start     Ordered   01/21/23 0918  For home use only DME Walker rolling  Once       Comments: 5 wheel  Question Answer Comment  Walker: With [place] Wheels   Patient needs a walker to treat with the following condition Weakness      01/21/23 0917             Follow-up Information     [name], MD. Schedule an appointment as soon as possible for a visit in 1 week(s).   Specialty: Internal Medicine Contact information: [address], [address] [place] [address] [phone]                 Major procedures and Radiology Reports - PLEASE review detailed and final reports thoroughly  -       CT Head Wo Contrast  Result Date: 01/19/2023 CLINICAL DATA:  Head and neck trauma EXAM:  CT HEAD WITHOUT CONTRAST CT CERVICAL SPINE WITHOUT CONTRAST TECHNIQUE: Multidetector CT imaging of the head and cervical spine was performed following the standard protocol without intravenous contrast. Multiplanar CT image reconstructions of the cervical spine were also generated. RADIATION DOSE REDUCTION: This exam was performed according to the departmental dose-optimization program which includes automated exposure control, adjustment of the mA and/or kV according to patient size and/or use of iterative reconstruction technique.  COMPARISON:  09/06/2022 FINDINGS: CT HEAD FINDINGS Brain: No evidence of acute infarction, hemorrhage, hydrocephalus, extra-axial collection or mass effect. Known pituitary mass with changes of transsphenoidal hypophysectomy. Tumor extends into the sphenoid sinuses. Recent MRI characterization 10/07/2022. Vascular: No hyperdense vessel or unexpected calcification. Skull: Negative for fracture Sinuses/Orbits: No evidence of injury. Postoperative changes of transsphenoidal access. CT CERVICAL SPINE FINDINGS Alignment: Normal. Skull base and vertebrae: No acute fracture. No primary bone lesion or focal pathologic process. Soft tissues and spinal canal: No prevertebral fluid or swelling. No visible canal hematoma. Disc levels: Generalized disc space narrowing and degenerative spurring. Up to mild spinal stenosis at C5-6. Upper chest: Negative IMPRESSION: 1. No evidence of acute intracranial or cervical spine injury. 2. Chronic findings are described above. Electronically Signed   By: Jorje Guild M.D.   On: 01/19/2023 06:29   CT Cervical Spine Wo Contrast  Result Date: 01/19/2023 CLINICAL DATA:  Head and neck trauma EXAM: CT HEAD WITHOUT CONTRAST CT CERVICAL SPINE WITHOUT CONTRAST TECHNIQUE: Multidetector CT imaging of the head and cervical spine was performed following the standard protocol without intravenous contrast. Multiplanar CT image reconstructions of the cervical spine were also generated. RADIATION DOSE REDUCTION: This exam was performed according to the departmental dose-optimization program which includes automated exposure control, adjustment of the mA and/or kV according to patient size and/or use of iterative reconstruction technique. COMPARISON:  09/06/2022 FINDINGS: CT HEAD FINDINGS Brain: No evidence of acute infarction, hemorrhage, hydrocephalus, extra-axial collection or mass effect. Known pituitary mass with changes of transsphenoidal hypophysectomy. Tumor extends into the sphenoid sinuses.  Recent MRI characterization 10/07/2022. Vascular: No hyperdense vessel or unexpected calcification. Skull: Negative for fracture Sinuses/Orbits: No evidence of injury. Postoperative changes of transsphenoidal access. CT CERVICAL SPINE FINDINGS Alignment: Normal. Skull base and vertebrae: No acute fracture. No primary bone lesion or focal pathologic process. Soft tissues and spinal canal: No prevertebral fluid or swelling. No visible canal hematoma. Disc levels: Generalized disc space narrowing and degenerative spurring. Up to mild spinal stenosis at C5-6. Upper chest: Negative IMPRESSION: 1. No evidence of acute intracranial or cervical spine injury. 2. Chronic findings are described above. Electronically Signed   By: Jorje Guild M.D.   On: 01/19/2023 06:29   DG Chest Port 1 View  Result Date: 01/19/2023 CLINICAL DATA:  Syncope with cough . EXAM: PORTABLE CHEST 1 VIEW COMPARISON:  09/07/2022 FINDINGS: The lungs are clear without focal pneumonia, edema, pneumothorax or pleural effusion. The cardiopericardial silhouette is within normal limits for size. The visualized bony structures of the thorax are unremarkable. Telemetry leads overlie the chest. IMPRESSION: No active disease. Electronically Signed   By: Misty Stanley M.D.   On: 01/19/2023 05:47   DG Pelvis Portable  Result Date: 01/19/2023 CLINICAL DATA:  Syncope and fall. EXAM: PORTABLE PELVIS 1-2 VIEWS COMPARISON:  05/31/2021. FINDINGS: No evidence for an acute fracture. Surgical clips in the pelvis again noted. SI joints and symphysis pubis unremarkable. IMPRESSION: Negative. Electronically Signed   By: Misty Stanley M.D.   On: 01/19/2023 05:46    Micro Results  Recent Results (from the past 240 hour(s))  Resp panel by RT-PCR (RSV, Flu A&B, Covid) Anterior Nasal Swab     Status: Abnormal   Collection Time: 01/19/23  6:20 AM   Specimen: Anterior Nasal Swab  Result Value Ref Range Status   SARS Coronavirus 2 by RT PCR POSITIVE (A) NEGATIVE  Final   Influenza A by PCR NEGATIVE NEGATIVE Final   Influenza B by PCR NEGATIVE NEGATIVE Final    Comment: (NOTE) The Xpert Xpress SARS-CoV-2/FLU/RSV plus assay is intended as an aid in the diagnosis of influenza from Nasopharyngeal swab specimens and should not be used as a sole basis for treatment. Nasal washings and aspirates are unacceptable for Xpert Xpress SARS-CoV-2/FLU/RSV testing.  Fact Sheet for Patients: EntrepreneurPulse.com.au  Fact Sheet for Healthcare Providers: IncredibleEmployment.be  This test is not yet approved or cleared by the [place] FDA and has been authorized for detection and/or diagnosis of SARS-CoV-2 by FDA under an Emergency Use Authorization (EUA). This EUA will remain in effect (meaning this test can be used) for the duration of the COVID-19 declaration under Section 564(b)(1) of the Act, 21 U.S.C. section 360bbb-3(b)(1), unless the authorization is terminated or revoked.     Resp Syncytial Virus by PCR NEGATIVE NEGATIVE Final    Comment: (NOTE) Fact Sheet for Patients: EntrepreneurPulse.com.au  Fact Sheet for Healthcare Providers: IncredibleEmployment.be  This test is not yet approved or cleared by the [place] FDA and has been authorized for detection and/or diagnosis of SARS-CoV-2 by FDA under an Emergency Use Authorization (EUA). This EUA will remain in effect (meaning this test can be used) for the duration of the COVID-19 declaration under Section 564(b)(1) of the Act, 21 U.S.C. section 360bbb-3(b)(1), unless the authorization is terminated or revoked.  Performed at [facility], [place] [address] [place], [place] [address]   Culture, blood (routine x 2)     Status: None (Preliminary result)   Collection Time: 01/19/23  7:48 AM   Specimen: BLOOD  Result Value Ref Range Status   Specimen Description BLOOD BLOOD RIGHT ARM  Final   Special Requests    Final    BOTTLES DRAWN AEROBIC AND ANAEROBIC Blood Culture results may not be optimal due to an excessive volume of blood received in culture bottles   Culture   Final    NO GROWTH 2 DAYS Performed at [facility], [place] [address], [place], [place] [address]    Report Status PENDING  Incomplete  Culture, blood (routine x 2)     Status: None (Preliminary result)   Collection Time: 01/19/23  8:10 AM   Specimen: BLOOD  Result Value Ref Range Status   Specimen Description BLOOD BLOOD RIGHT HAND  Final   Special Requests   Final    BOTTLES DRAWN AEROBIC AND ANAEROBIC Blood Culture adequate volume   Culture   Final    NO GROWTH 2 DAYS Performed at [facility], [place] [address], [place],  [address]    Report Status PENDING  Incomplete    Today   Subjective    Alexander Yoder today has no headache,no chest abdominal pain,no new weakness tingling or numbness, feels much better wants to go home today.    Objective   Blood pressure 117/67, pulse 77, temperature 98 F (36.7 C), temperature source Oral, resp. rate 20, height '5\' 10"'$  (1.778 m), weight 83 kg, SpO2 95 %.   Intake/Output Summary (Last 24 hours) at 01/21/2023 0935 Last data filed at  01/20/2023 1637 Gross per 24 hour  Intake 463.66 ml  Output --  Net 463.66 ml    Exam  Awake Alert, No new F.N deficits,    [place].AT,PERRAL Supple Neck,   Symmetrical Chest wall movement, Good air movement bilaterally, CTAB RRR,No Gallops,   +ve B.Sounds, Abd Soft, Non tender,  No Cyanosis, Clubbing or edema    Data Review   Recent Labs  Lab 01/19/23 0544 01/19/23 0608 01/20/23 0613 01/21/23 0315  WBC 8.2  --  8.0 8.2  HGB 17.9* 18.0* 17.0 17.7*  HCT 53.3* 53.0* 48.4 50.7  PLT 198  --  207 258  MCV 85.8  --  82.0 82.3  MCH 28.8  --  28.8 28.7  MCHC 33.6  --  35.1 34.9  RDW 14.0  --  14.1 13.8  LYMPHSABS 1.3  --   --  0.9  MONOABS 1.1*  --   --  0.2  EOSABS 0.2  --   --  0.0  BASOSABS 0.1  --   --  0.0     Recent Labs  Lab 01/19/23 0544 01/19/23 0608 01/19/23 0748 01/19/23 0800 01/19/23 0814 01/20/23 0613 01/21/23 0315  NA 135 136  --   --   --  133* 134*  K 3.8 3.8  --   --   --  4.3 4.6  CL 101 100  --   --   --  102 103  CO2 24  --   --   --   --  21* 22  ANIONGAP 10  --   --   --   --  10 9  GLUCOSE 87 83  --   --   --  68* 128*  BUN 9 10  --   --   --  12 18  CREATININE 1.22 1.10  --   --   --  1.10 1.14  AST 31  --   --   --   --   --   --   ALT 29  --   --   --   --   --   --   ALKPHOS 69  --   --   --   --   --   --   BILITOT 1.0  --   --   --   --   --   --   ALBUMIN 3.8  --   --   --   --   --   --   CRP  --   --   --   --  8.2*  --  13.9*  LATICACIDVEN 2.5*  --  1.5  --   --   --   --   INR 1.1  --   --   --   --   --   --   TSH  --   --   --  0.037*  --  0.133*  --   BNP  --   --   --   --   --  54.8 69.6  MG  --   --   --   --   --  2.1 2.4  CALCIUM 8.8*  --   --   --   --  8.7* 9.2     Total Time in preparing paper work, data evaluation and todays exam - 35 minutes  Signature  -    Lala Lund M.D on 01/21/2023 at 9:35 AM   -  To page go to [email or web address]

## 2023-01-21 NOTE — Discharge Instructions (Addendum)
Follow with Primary MD [name], MD in 7 days   Get CBC, CMP, Magnesium, TSH, free T4, 2 view Chest X ray -  checked next visit with your primary MD   Activity: As tolerated with Full fall precautions use walker/cane & assistance as needed  Disposition Home     Diet: Heart Healthy    Special Instructions: If you have smoked or chewed Tobacco  in the last 2 yrs please stop smoking, stop any regular Alcohol  and or any Recreational drug use.  On your next visit with your primary care physician please Get Medicines reviewed and adjusted.  Please request your Prim.MD to go over all Hospital Tests and Procedure/Radiological results at the follow up, please get all Hospital records sent to your Prim MD by signing hospital release before you go home.  If you experience worsening of your admission symptoms, develop shortness of breath, life threatening emergency, suicidal or homicidal thoughts you must seek medical attention immediately by calling 911 or calling your MD immediately  if symptoms less severe.  You Must read complete instructions/literature along with all the possible adverse reactions/side effects for all the Medicines you take and that have been prescribed to you. Take any new Medicines after you have completely understood and accpet all the possible adverse reactions/side effects.

## 2023-01-21 NOTE — TOC Initial Note (Signed)
Transition of Care [facility]) - Initial/Assessment Note    Patient Details  Name: Alexander Yoder MRN: [id number] Date of Birth: [birth year]  [facility]) CM/SW Contact:    Marilu Favre, RN Phone Number: 01/21/2023, 10:34 AM  Clinical Narrative:                  Spoke to patient at bedside.   Patient from home with wife. He helps care for.   Discussed orders for [facility] and HHPT and walker.   Patient states he has a walker at home . He is agreeable for [facility] and HHPT. No preference.   Claiborne Billings with [facility] accepted referral. Information on AVS  Expected Discharge Plan: [place] Barriers to Discharge: No Barriers Identified   Patient Goals and CMS Choice Patient states their goals for this hospitalization and ongoing recovery are:: to return to home CMS Medicare.gov Compare Post Acute Care list provided to:: Patient Choice offered to / list presented to : Patient      Expected Discharge Plan and Services   Discharge Planning Services: CM Consult Post Acute Care Choice: [place] arrangements for the past 2 months: Apartment Expected Discharge Date: 01/21/23               DME Arranged: N/A         HH Arranged: RN, PT HH Agency: [place] Date [place]: 01/21/23 Time [place]: 1034 Representative spoke with at [place]: Claiborne Billings  Prior Living Arrangements/Services Living arrangements for the past 2 months: [place] with:: Spouse Patient language and need for interpreter reviewed:: Yes Do you feel safe going back to the place where you live?: Yes          Current home services: DME Criminal Activity/Legal Involvement Pertinent to Current Situation/Hospitalization: No - Comment as needed  Activities of Daily Living Home Assistive Devices/Equipment: Cane (specify quad or straight) ADL Screening (condition at time of admission) Patient's cognitive ability adequate to safely complete  daily activities?: Yes Is the patient deaf or have difficulty hearing?: No Does the patient have difficulty seeing, even when wearing glasses/contacts?: No Does the patient have difficulty concentrating, remembering, or making decisions?: No Patient able to express need for assistance with ADLs?: Yes Does the patient have difficulty dressing or bathing?: No Independently performs ADLs?: Yes (appropriate for developmental age) Does the patient have difficulty walking or climbing stairs?: No Weakness of Legs: Both Weakness of Arms/Hands: None  Permission Sought/Granted   Permission granted to share information with : No              Emotional Assessment Appearance:: Appears stated age Attitude/Demeanor/Rapport: Engaged Affect (typically observed): Accepting Orientation: : Oriented to Self, Oriented to Place, Oriented to  Time, Oriented to Situation Alcohol / Substance Use: Not Applicable Psych Involvement: No (comment)  Admission diagnosis:  Elevated lactic acid level [R79.89] Syncope, unspecified syncope type [R55] COVID-19 [U07.1] Patient Active Problem List   Diagnosis Date Noted   COVID-19 01/19/2023   Syncope and collapse 01/19/2023   Lactic acidosis 01/19/2023   Hypocalcemia 01/19/2023   Chronic diastolic CHF (congestive heart failure) ([place]) 01/19/2023   Polycythemia 01/19/2023   Acute hyponatremia 09/07/2022   Fall at home, initial encounter 09/07/2022   Nausea & vomiting 09/07/2022   Hypothyroidism    Panhypopituitarism ([place])    Adrenal insufficiency ([place])    Diabetes insipidus ([place])    PCP:  [name], MD Pharmacy:  [facility], [place] [address] [place] [address] Phone: [phone] Fax: [phone]  [id number]  [id number]: [phone]: [phone] [facility] [address]: [phone]: [phone]    Social Determinants of Health (SDOH) Social History: SDOH Screenings   Food Insecurity: No Food Insecurity (01/19/2023)  Housing: Low Risk  (01/19/2023)  Transportation Needs: No Transportation Needs (01/19/2023)  Utilities: Not At Risk (01/19/2023)  Tobacco Use: Low Risk  (11/29/2022)   SDOH Interventions:     Readmission Risk Interventions     No data to display

## 2023-01-24 LAB — CULTURE, BLOOD (ROUTINE X 2)
Culture: NO GROWTH
Culture: NO GROWTH
Special Requests: ADEQUATE

## 2023-01-25 DIAGNOSIS — Z7982 Long term (current) use of aspirin: Secondary | ICD-10-CM | POA: Diagnosis not present

## 2023-01-25 DIAGNOSIS — D751 Secondary polycythemia: Secondary | ICD-10-CM | POA: Diagnosis not present

## 2023-01-25 DIAGNOSIS — E232 Diabetes insipidus: Secondary | ICD-10-CM | POA: Diagnosis not present

## 2023-01-25 DIAGNOSIS — H409 Unspecified glaucoma: Secondary | ICD-10-CM | POA: Diagnosis not present

## 2023-01-25 DIAGNOSIS — I110 Hypertensive heart disease with heart failure: Secondary | ICD-10-CM | POA: Diagnosis not present

## 2023-01-25 DIAGNOSIS — I5032 Chronic diastolic (congestive) heart failure: Secondary | ICD-10-CM | POA: Diagnosis not present

## 2023-01-25 DIAGNOSIS — Z9181 History of falling: Secondary | ICD-10-CM | POA: Diagnosis not present

## 2023-01-25 DIAGNOSIS — E039 Hypothyroidism, unspecified: Secondary | ICD-10-CM | POA: Diagnosis not present

## 2023-01-25 DIAGNOSIS — U071 COVID-19: Secondary | ICD-10-CM | POA: Diagnosis not present

## 2023-01-25 DIAGNOSIS — E2740 Unspecified adrenocortical insufficiency: Secondary | ICD-10-CM | POA: Diagnosis not present

## 2023-01-25 DIAGNOSIS — Z7902 Long term (current) use of antithrombotics/antiplatelets: Secondary | ICD-10-CM | POA: Diagnosis not present

## 2023-01-30 DIAGNOSIS — H409 Unspecified glaucoma: Secondary | ICD-10-CM | POA: Diagnosis not present

## 2023-01-30 DIAGNOSIS — U071 COVID-19: Secondary | ICD-10-CM | POA: Diagnosis not present

## 2023-01-30 DIAGNOSIS — E2740 Unspecified adrenocortical insufficiency: Secondary | ICD-10-CM | POA: Diagnosis not present

## 2023-01-30 DIAGNOSIS — I110 Hypertensive heart disease with heart failure: Secondary | ICD-10-CM | POA: Diagnosis not present

## 2023-01-30 DIAGNOSIS — D751 Secondary polycythemia: Secondary | ICD-10-CM | POA: Diagnosis not present

## 2023-01-30 DIAGNOSIS — I5032 Chronic diastolic (congestive) heart failure: Secondary | ICD-10-CM | POA: Diagnosis not present

## 2023-02-04 DIAGNOSIS — H0102A Squamous blepharitis right eye, upper and lower eyelids: Secondary | ICD-10-CM | POA: Diagnosis not present

## 2023-02-04 DIAGNOSIS — H04123 Dry eye syndrome of bilateral lacrimal glands: Secondary | ICD-10-CM | POA: Diagnosis not present

## 2023-02-04 DIAGNOSIS — H524 Presbyopia: Secondary | ICD-10-CM | POA: Diagnosis not present

## 2023-02-04 DIAGNOSIS — H401133 Primary open-angle glaucoma, bilateral, severe stage: Secondary | ICD-10-CM | POA: Diagnosis not present

## 2023-02-04 DIAGNOSIS — H47293 Other optic atrophy, bilateral: Secondary | ICD-10-CM | POA: Diagnosis not present

## 2023-02-05 DIAGNOSIS — E230 Hypopituitarism: Secondary | ICD-10-CM | POA: Diagnosis not present

## 2023-02-05 DIAGNOSIS — G9331 Postviral fatigue syndrome: Secondary | ICD-10-CM | POA: Diagnosis not present

## 2023-02-05 DIAGNOSIS — R252 Cramp and spasm: Secondary | ICD-10-CM | POA: Diagnosis not present

## 2023-02-06 DIAGNOSIS — I110 Hypertensive heart disease with heart failure: Secondary | ICD-10-CM | POA: Diagnosis not present

## 2023-02-06 DIAGNOSIS — H409 Unspecified glaucoma: Secondary | ICD-10-CM | POA: Diagnosis not present

## 2023-02-06 DIAGNOSIS — E2740 Unspecified adrenocortical insufficiency: Secondary | ICD-10-CM | POA: Diagnosis not present

## 2023-02-06 DIAGNOSIS — I5032 Chronic diastolic (congestive) heart failure: Secondary | ICD-10-CM | POA: Diagnosis not present

## 2023-02-06 DIAGNOSIS — U071 COVID-19: Secondary | ICD-10-CM | POA: Diagnosis not present

## 2023-02-06 DIAGNOSIS — D751 Secondary polycythemia: Secondary | ICD-10-CM | POA: Diagnosis not present

## 2023-02-12 DIAGNOSIS — M545 Low back pain, unspecified: Secondary | ICD-10-CM | POA: Diagnosis not present

## 2023-02-12 DIAGNOSIS — E232 Diabetes insipidus: Secondary | ICD-10-CM | POA: Diagnosis not present

## 2023-02-12 DIAGNOSIS — D497 Neoplasm of unspecified behavior of endocrine glands and other parts of nervous system: Secondary | ICD-10-CM | POA: Diagnosis not present

## 2023-02-12 DIAGNOSIS — Z125 Encounter for screening for malignant neoplasm of prostate: Secondary | ICD-10-CM | POA: Diagnosis not present

## 2023-02-12 DIAGNOSIS — E230 Hypopituitarism: Secondary | ICD-10-CM | POA: Diagnosis not present

## 2023-02-24 DIAGNOSIS — H409 Unspecified glaucoma: Secondary | ICD-10-CM | POA: Diagnosis not present

## 2023-02-24 DIAGNOSIS — I110 Hypertensive heart disease with heart failure: Secondary | ICD-10-CM | POA: Diagnosis not present

## 2023-02-24 DIAGNOSIS — Z9181 History of falling: Secondary | ICD-10-CM | POA: Diagnosis not present

## 2023-02-24 DIAGNOSIS — Z7982 Long term (current) use of aspirin: Secondary | ICD-10-CM | POA: Diagnosis not present

## 2023-02-24 DIAGNOSIS — I5032 Chronic diastolic (congestive) heart failure: Secondary | ICD-10-CM | POA: Diagnosis not present

## 2023-02-24 DIAGNOSIS — U071 COVID-19: Secondary | ICD-10-CM | POA: Diagnosis not present

## 2023-02-24 DIAGNOSIS — Z7902 Long term (current) use of antithrombotics/antiplatelets: Secondary | ICD-10-CM | POA: Diagnosis not present

## 2023-02-24 DIAGNOSIS — E232 Diabetes insipidus: Secondary | ICD-10-CM | POA: Diagnosis not present

## 2023-02-24 DIAGNOSIS — E039 Hypothyroidism, unspecified: Secondary | ICD-10-CM | POA: Diagnosis not present

## 2023-02-24 DIAGNOSIS — D751 Secondary polycythemia: Secondary | ICD-10-CM | POA: Diagnosis not present

## 2023-02-24 DIAGNOSIS — E2740 Unspecified adrenocortical insufficiency: Secondary | ICD-10-CM | POA: Diagnosis not present

## 2023-03-07 DIAGNOSIS — H409 Unspecified glaucoma: Secondary | ICD-10-CM | POA: Diagnosis not present

## 2023-03-07 DIAGNOSIS — U071 COVID-19: Secondary | ICD-10-CM | POA: Diagnosis not present

## 2023-03-07 DIAGNOSIS — I110 Hypertensive heart disease with heart failure: Secondary | ICD-10-CM | POA: Diagnosis not present

## 2023-03-07 DIAGNOSIS — I5032 Chronic diastolic (congestive) heart failure: Secondary | ICD-10-CM | POA: Diagnosis not present

## 2023-03-07 DIAGNOSIS — E2740 Unspecified adrenocortical insufficiency: Secondary | ICD-10-CM | POA: Diagnosis not present

## 2023-03-07 DIAGNOSIS — D751 Secondary polycythemia: Secondary | ICD-10-CM | POA: Diagnosis not present

## 2023-03-25 DIAGNOSIS — I5032 Chronic diastolic (congestive) heart failure: Secondary | ICD-10-CM | POA: Diagnosis not present

## 2023-03-25 DIAGNOSIS — D751 Secondary polycythemia: Secondary | ICD-10-CM | POA: Diagnosis not present

## 2023-03-25 DIAGNOSIS — H409 Unspecified glaucoma: Secondary | ICD-10-CM | POA: Diagnosis not present

## 2023-03-25 DIAGNOSIS — E2740 Unspecified adrenocortical insufficiency: Secondary | ICD-10-CM | POA: Diagnosis not present

## 2023-03-25 DIAGNOSIS — U071 COVID-19: Secondary | ICD-10-CM | POA: Diagnosis not present

## 2023-03-25 DIAGNOSIS — I110 Hypertensive heart disease with heart failure: Secondary | ICD-10-CM | POA: Diagnosis not present

## 2023-03-26 DIAGNOSIS — I110 Hypertensive heart disease with heart failure: Secondary | ICD-10-CM | POA: Diagnosis not present

## 2023-03-26 DIAGNOSIS — I5032 Chronic diastolic (congestive) heart failure: Secondary | ICD-10-CM | POA: Diagnosis not present

## 2023-03-26 DIAGNOSIS — E232 Diabetes insipidus: Secondary | ICD-10-CM | POA: Diagnosis not present

## 2023-03-26 DIAGNOSIS — D751 Secondary polycythemia: Secondary | ICD-10-CM | POA: Diagnosis not present

## 2023-03-26 DIAGNOSIS — U071 COVID-19: Secondary | ICD-10-CM | POA: Diagnosis not present

## 2023-03-26 DIAGNOSIS — E039 Hypothyroidism, unspecified: Secondary | ICD-10-CM | POA: Diagnosis not present

## 2023-03-26 DIAGNOSIS — H409 Unspecified glaucoma: Secondary | ICD-10-CM | POA: Diagnosis not present

## 2023-03-26 DIAGNOSIS — Z7902 Long term (current) use of antithrombotics/antiplatelets: Secondary | ICD-10-CM | POA: Diagnosis not present

## 2023-03-26 DIAGNOSIS — E2740 Unspecified adrenocortical insufficiency: Secondary | ICD-10-CM | POA: Diagnosis not present

## 2023-03-26 DIAGNOSIS — Z9181 History of falling: Secondary | ICD-10-CM | POA: Diagnosis not present

## 2023-03-26 DIAGNOSIS — Z7982 Long term (current) use of aspirin: Secondary | ICD-10-CM | POA: Diagnosis not present

## 2023-03-28 DIAGNOSIS — Z125 Encounter for screening for malignant neoplasm of prostate: Secondary | ICD-10-CM | POA: Diagnosis not present

## 2023-03-28 DIAGNOSIS — E230 Hypopituitarism: Secondary | ICD-10-CM | POA: Diagnosis not present

## 2023-03-28 DIAGNOSIS — E232 Diabetes insipidus: Secondary | ICD-10-CM | POA: Diagnosis not present

## 2023-04-04 DIAGNOSIS — Z125 Encounter for screening for malignant neoplasm of prostate: Secondary | ICD-10-CM | POA: Diagnosis not present

## 2023-04-04 DIAGNOSIS — I5032 Chronic diastolic (congestive) heart failure: Secondary | ICD-10-CM | POA: Diagnosis not present

## 2023-04-04 DIAGNOSIS — E2740 Unspecified adrenocortical insufficiency: Secondary | ICD-10-CM | POA: Diagnosis not present

## 2023-04-04 DIAGNOSIS — H409 Unspecified glaucoma: Secondary | ICD-10-CM | POA: Diagnosis not present

## 2023-04-04 DIAGNOSIS — D751 Secondary polycythemia: Secondary | ICD-10-CM | POA: Diagnosis not present

## 2023-04-04 DIAGNOSIS — D497 Neoplasm of unspecified behavior of endocrine glands and other parts of nervous system: Secondary | ICD-10-CM | POA: Diagnosis not present

## 2023-04-04 DIAGNOSIS — E232 Diabetes insipidus: Secondary | ICD-10-CM | POA: Diagnosis not present

## 2023-04-04 DIAGNOSIS — U071 COVID-19: Secondary | ICD-10-CM | POA: Diagnosis not present

## 2023-04-04 DIAGNOSIS — E230 Hypopituitarism: Secondary | ICD-10-CM | POA: Diagnosis not present

## 2023-04-04 DIAGNOSIS — M545 Low back pain, unspecified: Secondary | ICD-10-CM | POA: Diagnosis not present

## 2023-04-04 DIAGNOSIS — I110 Hypertensive heart disease with heart failure: Secondary | ICD-10-CM | POA: Diagnosis not present

## 2023-04-09 DIAGNOSIS — I5032 Chronic diastolic (congestive) heart failure: Secondary | ICD-10-CM | POA: Diagnosis not present

## 2023-04-09 DIAGNOSIS — E232 Diabetes insipidus: Secondary | ICD-10-CM | POA: Diagnosis not present

## 2023-04-09 DIAGNOSIS — Z9181 History of falling: Secondary | ICD-10-CM | POA: Diagnosis not present

## 2023-04-09 DIAGNOSIS — H409 Unspecified glaucoma: Secondary | ICD-10-CM | POA: Diagnosis not present

## 2023-04-09 DIAGNOSIS — Z7982 Long term (current) use of aspirin: Secondary | ICD-10-CM | POA: Diagnosis not present

## 2023-04-09 DIAGNOSIS — M545 Low back pain, unspecified: Secondary | ICD-10-CM | POA: Diagnosis not present

## 2023-04-09 DIAGNOSIS — D751 Secondary polycythemia: Secondary | ICD-10-CM | POA: Diagnosis not present

## 2023-04-09 DIAGNOSIS — E039 Hypothyroidism, unspecified: Secondary | ICD-10-CM | POA: Diagnosis not present

## 2023-04-09 DIAGNOSIS — Z7902 Long term (current) use of antithrombotics/antiplatelets: Secondary | ICD-10-CM | POA: Diagnosis not present

## 2023-04-09 DIAGNOSIS — E2740 Unspecified adrenocortical insufficiency: Secondary | ICD-10-CM | POA: Diagnosis not present

## 2023-04-09 DIAGNOSIS — I110 Hypertensive heart disease with heart failure: Secondary | ICD-10-CM | POA: Diagnosis not present

## 2023-04-10 DIAGNOSIS — H409 Unspecified glaucoma: Secondary | ICD-10-CM | POA: Diagnosis not present

## 2023-04-10 DIAGNOSIS — I5032 Chronic diastolic (congestive) heart failure: Secondary | ICD-10-CM | POA: Diagnosis not present

## 2023-04-10 DIAGNOSIS — I110 Hypertensive heart disease with heart failure: Secondary | ICD-10-CM | POA: Diagnosis not present

## 2023-04-10 DIAGNOSIS — D751 Secondary polycythemia: Secondary | ICD-10-CM | POA: Diagnosis not present

## 2023-04-10 DIAGNOSIS — E2740 Unspecified adrenocortical insufficiency: Secondary | ICD-10-CM | POA: Diagnosis not present

## 2023-04-10 DIAGNOSIS — U071 COVID-19: Secondary | ICD-10-CM | POA: Diagnosis not present

## 2023-04-25 DIAGNOSIS — Z9181 History of falling: Secondary | ICD-10-CM | POA: Diagnosis not present

## 2023-04-25 DIAGNOSIS — I5032 Chronic diastolic (congestive) heart failure: Secondary | ICD-10-CM | POA: Diagnosis not present

## 2023-04-25 DIAGNOSIS — D751 Secondary polycythemia: Secondary | ICD-10-CM | POA: Diagnosis not present

## 2023-04-25 DIAGNOSIS — Z7902 Long term (current) use of antithrombotics/antiplatelets: Secondary | ICD-10-CM | POA: Diagnosis not present

## 2023-04-25 DIAGNOSIS — U071 COVID-19: Secondary | ICD-10-CM | POA: Diagnosis not present

## 2023-04-25 DIAGNOSIS — I110 Hypertensive heart disease with heart failure: Secondary | ICD-10-CM | POA: Diagnosis not present

## 2023-04-25 DIAGNOSIS — H409 Unspecified glaucoma: Secondary | ICD-10-CM | POA: Diagnosis not present

## 2023-04-25 DIAGNOSIS — E232 Diabetes insipidus: Secondary | ICD-10-CM | POA: Diagnosis not present

## 2023-04-25 DIAGNOSIS — E039 Hypothyroidism, unspecified: Secondary | ICD-10-CM | POA: Diagnosis not present

## 2023-04-25 DIAGNOSIS — E2740 Unspecified adrenocortical insufficiency: Secondary | ICD-10-CM | POA: Diagnosis not present

## 2023-04-25 DIAGNOSIS — Z7982 Long term (current) use of aspirin: Secondary | ICD-10-CM | POA: Diagnosis not present

## 2023-05-06 DIAGNOSIS — I110 Hypertensive heart disease with heart failure: Secondary | ICD-10-CM | POA: Diagnosis not present

## 2023-05-06 DIAGNOSIS — E2740 Unspecified adrenocortical insufficiency: Secondary | ICD-10-CM | POA: Diagnosis not present

## 2023-05-06 DIAGNOSIS — U071 COVID-19: Secondary | ICD-10-CM | POA: Diagnosis not present

## 2023-05-06 DIAGNOSIS — D751 Secondary polycythemia: Secondary | ICD-10-CM | POA: Diagnosis not present

## 2023-05-06 DIAGNOSIS — I5032 Chronic diastolic (congestive) heart failure: Secondary | ICD-10-CM | POA: Diagnosis not present

## 2023-05-06 DIAGNOSIS — H409 Unspecified glaucoma: Secondary | ICD-10-CM | POA: Diagnosis not present

## 2023-05-20 DIAGNOSIS — D751 Secondary polycythemia: Secondary | ICD-10-CM | POA: Diagnosis not present

## 2023-05-20 DIAGNOSIS — I5032 Chronic diastolic (congestive) heart failure: Secondary | ICD-10-CM | POA: Diagnosis not present

## 2023-05-20 DIAGNOSIS — H409 Unspecified glaucoma: Secondary | ICD-10-CM | POA: Diagnosis not present

## 2023-05-20 DIAGNOSIS — U071 COVID-19: Secondary | ICD-10-CM | POA: Diagnosis not present

## 2023-05-20 DIAGNOSIS — E2740 Unspecified adrenocortical insufficiency: Secondary | ICD-10-CM | POA: Diagnosis not present

## 2023-05-20 DIAGNOSIS — I110 Hypertensive heart disease with heart failure: Secondary | ICD-10-CM | POA: Diagnosis not present

## 2023-07-15 DIAGNOSIS — D485 Neoplasm of uncertain behavior of skin: Secondary | ICD-10-CM | POA: Diagnosis not present

## 2023-07-15 DIAGNOSIS — C44729 Squamous cell carcinoma of skin of left lower limb, including hip: Secondary | ICD-10-CM | POA: Diagnosis not present

## 2023-07-15 DIAGNOSIS — Z85828 Personal history of other malignant neoplasm of skin: Secondary | ICD-10-CM | POA: Diagnosis not present

## 2023-08-28 DIAGNOSIS — H401133 Primary open-angle glaucoma, bilateral, severe stage: Secondary | ICD-10-CM | POA: Diagnosis not present

## 2023-09-17 ENCOUNTER — Emergency Department (HOSPITAL_COMMUNITY)
Admission: EM | Admit: 2023-09-17 | Discharge: 2023-09-17 | Disposition: A | Discharge status: Home / Self Care | Payer: Medicare Other | Attending: Emergency Medicine | Admitting: Emergency Medicine

## 2023-09-17 ENCOUNTER — Emergency Department (HOSPITAL_COMMUNITY): Payer: Medicare Other

## 2023-09-17 ENCOUNTER — Other Ambulatory Visit: Payer: Self-pay

## 2023-09-17 DIAGNOSIS — I6789 Other cerebrovascular disease: Secondary | ICD-10-CM | POA: Diagnosis not present

## 2023-09-17 DIAGNOSIS — Z79899 Other long term (current) drug therapy: Secondary | ICD-10-CM | POA: Diagnosis not present

## 2023-09-17 DIAGNOSIS — R519 Headache, unspecified: Secondary | ICD-10-CM | POA: Diagnosis not present

## 2023-09-17 DIAGNOSIS — H53143 Visual discomfort, bilateral: Secondary | ICD-10-CM | POA: Diagnosis not present

## 2023-09-17 DIAGNOSIS — T679XXA Effect of heat and light, unspecified, initial encounter: Secondary | ICD-10-CM | POA: Diagnosis not present

## 2023-09-17 DIAGNOSIS — Z7982 Long term (current) use of aspirin: Secondary | ICD-10-CM | POA: Insufficient documentation

## 2023-09-17 DIAGNOSIS — H547 Unspecified visual loss: Secondary | ICD-10-CM

## 2023-09-17 DIAGNOSIS — R4789 Other speech disturbances: Secondary | ICD-10-CM | POA: Diagnosis not present

## 2023-09-17 DIAGNOSIS — R9431 Abnormal electrocardiogram [ECG] [EKG]: Secondary | ICD-10-CM | POA: Diagnosis not present

## 2023-09-17 DIAGNOSIS — Z7902 Long term (current) use of antithrombotics/antiplatelets: Secondary | ICD-10-CM | POA: Diagnosis not present

## 2023-09-17 DIAGNOSIS — R930 Abnormal findings on diagnostic imaging of skull and head, not elsewhere classified: Secondary | ICD-10-CM | POA: Diagnosis not present

## 2023-09-17 DIAGNOSIS — H538 Other visual disturbances: Secondary | ICD-10-CM | POA: Diagnosis not present

## 2023-09-17 DIAGNOSIS — R29818 Other symptoms and signs involving the nervous system: Secondary | ICD-10-CM | POA: Diagnosis not present

## 2023-09-17 DIAGNOSIS — Z91010 Allergy to peanuts: Secondary | ICD-10-CM | POA: Insufficient documentation

## 2023-09-17 DIAGNOSIS — H539 Unspecified visual disturbance: Secondary | ICD-10-CM | POA: Diagnosis not present

## 2023-09-17 DIAGNOSIS — T5991XA Toxic effect of unspecified gases, fumes and vapors, accidental (unintentional), initial encounter: Secondary | ICD-10-CM | POA: Diagnosis not present

## 2023-09-17 LAB — URINALYSIS, ROUTINE W REFLEX MICROSCOPIC
Bilirubin Urine: NEGATIVE
Glucose, UA: NEGATIVE mg/dL
Hgb urine dipstick: NEGATIVE
Ketones, ur: NEGATIVE mg/dL
Leukocytes,Ua: NEGATIVE
Nitrite: NEGATIVE
Protein, ur: NEGATIVE mg/dL
Specific Gravity, Urine: 1.003 — ABNORMAL LOW (ref 1.005–1.030)
pH: 7 (ref 5.0–8.0)

## 2023-09-17 LAB — COMPREHENSIVE METABOLIC PANEL
ALT: 21 U/L (ref 0–44)
AST: 24 U/L (ref 15–41)
Albumin: 4.1 g/dL (ref 3.5–5.0)
Alkaline Phosphatase: 74 U/L (ref 38–126)
Anion gap: 12 (ref 5–15)
BUN: 10 mg/dL (ref 8–23)
CO2: 23 mmol/L (ref 22–32)
Calcium: 9.4 mg/dL (ref 8.9–10.3)
Chloride: 103 mmol/L (ref 98–111)
Creatinine, Ser: 1.13 mg/dL (ref 0.61–1.24)
GFR, Estimated: >60 mL/min (ref >60)
Glucose, Bld: 90 mg/dL (ref 70–99)
Potassium: 4.6 mmol/L (ref 3.5–5.1)
Sodium: 138 mmol/L (ref 135–145)
Total Bilirubin: 1 mg/dL (ref 0.3–1.2)
Total Protein: 7.2 g/dL (ref 6.5–8.1)

## 2023-09-17 LAB — CBC WITH DIFFERENTIAL/PLATELET
Abs Immature Granulocytes: 0.03 10*3/uL (ref 0.00–0.07)
Basophils Absolute: 0.1 10*3/uL (ref 0.0–0.1)
Basophils Relative: 1 %
Eosinophils Absolute: 0.3 10*3/uL (ref 0.0–0.5)
Eosinophils Relative: 3 %
HCT: 51.8 % (ref 39.0–52.0)
Hemoglobin: 17.4 g/dL — ABNORMAL HIGH (ref 13.0–17.0)
Immature Granulocytes: 0 %
Lymphocytes Relative: 20 %
Lymphs Abs: 1.5 10*3/uL (ref 0.7–4.0)
MCH: 28.2 pg (ref 26.0–34.0)
MCHC: 33.6 g/dL (ref 30.0–36.0)
MCV: 84.1 fL (ref 80.0–100.0)
Monocytes Absolute: 0.6 10*3/uL (ref 0.1–1.0)
Monocytes Relative: 8 %
Neutro Abs: 5.1 10*3/uL (ref 1.7–7.7)
Neutrophils Relative %: 68 %
Platelets: 297 10*3/uL (ref 150–400)
RBC: 6.16 MIL/uL — ABNORMAL HIGH (ref 4.22–5.81)
RDW: 14.4 % (ref 11.5–15.5)
WBC: 7.6 10*3/uL (ref 4.0–10.5)
nRBC: 0 % (ref 0.0–0.2)

## 2023-09-17 MED ORDER — TETRACAINE HCL 0.5 % OP SOLN
2.0000 [drp] | Freq: Once | OPHTHALMIC | Status: AC
  Administered 2023-09-17: 2 [drp] via OPHTHALMIC
  Filled 2023-09-17: qty 4

## 2023-09-17 NOTE — Discharge Instructions (Signed)
You had blood work in the emergency department that was normal.  Your head CT showed no changes from your previous pituitary surgery.  I spoke with an ophthalmologist, Dr. Allena Katz.  He would like you to call their office tomorrow morning to set up an appointment.  When you call, you can tell them that you were seen in the emergency department, and Dr. Allena Katz wanted you to be seen in the office within the next 48 hours.

## 2023-09-17 NOTE — ED Provider Notes (Signed)
[place] EMERGENCY DEPARTMENT AT [facility] Provider Note   CSN: [id number] Arrival date & time: 09/17/23  1255     History  Chief Complaint  Patient presents with   vision changes   Eye Problem    Alexander Yoder is a 89 y.o. male.  This is a 89 year old male who is here today with bilateral vision changes.  Patient reports that earlier today he felt as though there was "smoke "while he was driving his vehicle.  He actually pulled over to see if there was smoke coming from the engine.  He notices that when he closes his eyes, he does have the should have something moving upward in his visual field.  He denies any difficulty with his vision.  He has a history of open-angle glaucoma.  Uses eyedrops regularly.  No pain in his eye, no trauma.  He was at his PCPs office who recommended he come to the emergency department.        Home Medications Prior to Admission medications   Medication Sig Start Date End Date Taking? Authorizing Provider  acetaminophen (TYLENOL) 325 MG tablet Take 2 tablets (650 mg total) by mouth every 6 (six) hours as needed for mild pain (or Fever >/= 101). 09/10/22   [name], Leana Roe, MD  albuterol (VENTOLIN HFA) 108 (90 Base) MCG/ACT inhaler Inhale 2 puffs into the lungs every 6 (six) hours as needed for wheezing or shortness of breath. 01/21/23   Leroy Sea, MD  aspirin EC 81 MG tablet Take 81 mg by mouth daily.    [provider]  B Complex-C (B-COMPLEX WITH VITAMIN C) tablet Take 1 tablet by mouth daily.    [provider]  brimonidine (ALPHAGAN) 0.15 % ophthalmic solution Place 1 drop into the right eye 2 (two) times daily.    [provider]  Cholecalciferol (VITAMIN D) 50 MCG (2000 UT) CAPS Take 2,000 Units by mouth daily.    [provider]  clopidogrel (PLAVIX) 75 MG tablet Take 75 mg by mouth daily. 03/29/21   [provider]  COMBIGAN 0.2-0.5 % ophthalmic solution Place 1 drop into the  left eye 2 (two) times daily. 08/20/22   [provider]  desmopressin (DDAVP) 0.1 MG tablet Take 1 tablet (0.1 mg total) by mouth 2 (two) times daily. 09/10/22   [name], Leana Roe, MD  hydrocortisone (CORTEF) 10 MG tablet Take  2 tablets (20mg ) by mouth in the morning, and 1 tablet (10mg ) in the evening. 01/27/23   Leroy Sea, MD  latanoprost (XALATAN) 0.005 % ophthalmic solution Place 1 drop into both eyes at bedtime.    [provider]  levothyroxine (SYNTHROID) 100 MCG tablet Take 1 tablet (100 mcg total) by mouth daily before breakfast. 01/22/23   Leroy Sea, MD  Omega-3 Fatty Acids (FISH OIL) 1000 MG CAPS Take 1,000 mg by mouth daily.    [provider]  testosterone cypionate (DEPOTESTOSTERONE CYPIONATE) 200 MG/ML injection Inject 0.7 mLs into the muscle every 14 (fourteen) days. 10/31/20   [provider]  Turmeric (QC TUMERIC COMPLEX PO) Take 1 capsule by mouth daily.    [provider]      Allergies    Peanut-containing drug products, Penicillins, and Rosuvastatin calcium    Review of Systems   Review of Systems  Physical Exam Updated Vital Signs BP (!) 169/87   Pulse 62   Temp 98 F (36.7 C)   Resp 18   Ht 5'  10" (1.778 m)   Wt 79.8 kg   SpO2 95%   BMI 25.25 kg/m  Physical Exam Vitals reviewed.  Eyes:     General:        Right eye: No discharge.        Left eye: No discharge.     Pupils: Pupils are equal, round, and reactive to light.     Comments: Right eye 20/125.  Left eye 20/25.  Intraocular pressure 12 bilaterally.  No corneal abrasion.  Patient's vision at his baseline.  Neurological:     Mental Status: He is alert.     ED Results / Procedures / Treatments   Labs (all labs ordered are listed, but only abnormal results are displayed) Labs Reviewed  CBC WITH DIFFERENTIAL/PLATELET - Abnormal; Notable for the following components:      Result Value   RBC 6.16 (*)    Hemoglobin 17.4 (*)    All other  components within normal limits  URINALYSIS, ROUTINE W REFLEX MICROSCOPIC - Abnormal; Notable for the following components:   Color, Urine STRAW (*)    Specific Gravity, Urine 1.003 (*)    All other components within normal limits  COMPREHENSIVE METABOLIC PANEL    EKG None  Radiology CT Head Wo Contrast  Result Date: 09/17/2023 CLINICAL DATA:  Vision changes x4 days. EXAM: CT HEAD WITHOUT CONTRAST TECHNIQUE: Contiguous axial images were obtained from the base of the skull through the vertex without intravenous contrast. RADIATION DOSE REDUCTION: This exam was performed according to the departmental dose-optimization program which includes automated exposure control, adjustment of the mA and/or kV according to patient size and/or use of iterative reconstruction technique. COMPARISON:  Head CT 01/19/2023. FINDINGS: Brain: No acute hemorrhage. Stable mild chronic small-vessel disease. No hydrocephalus or extra-axial collection. 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 Vascular: No hyperdense vessel or unexpected calcification. Skull: No calvarial fracture or suspicious bone lesion. Sinuses/Orbits: As above.  Orbits are unremarkable. Other: None. IMPRESSION: 1. No acute intracranial abnormality. 2. 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 3. Stable mild chronic small-vessel disease. Electronically Signed   By: Orvan Falconer M.D.   On: 09/17/2023 16:19    Procedures Procedures    Medications Ordered in ED Medications  tetracaine (PONTOCAINE) 0.5 % ophthalmic solution 2 drop (2 drops Both Eyes Given 09/17/23 1702)    ED Course/ Medical Decision Making/ A&P                                 Medical Decision Making 40-year-old male here today with  bilateral vision trouble.  Differential diagnoses include vitreous hemorrhage, retinal detachment, less likely glaucoma, previous optic chiasm disruption.  Plan- my independent review of the patient's head CT shows no intracranial hemorrhage.  Patient does have a slight mass effect on his optic chiasm, unchanged.  This is due to previous pituitary adenoma.  On exam, patient has no elevated pressure in his eye, and his vision is at his baseline.  His right eye has had problems ever since his initial pituitary adenoma.  Discussed patient's case with ophthalmologist Dr. Allena Katz, who recommended outpatient follow-up in their office.  I have read of this information with the patient.  Will discharge home.  Risk Prescription drug management.           Final Clinical Impression(s) / ED Diagnoses Final diagnoses:  Vision problem    Rx / DC Orders ED Discharge Orders     None         Arletha Pili, DO 09/17/23 2344

## 2023-09-17 NOTE — ED Provider Triage Note (Signed)
Emergency Medicine Provider Triage Evaluation Note  Alexander Yoder , a 89 y.o. male  was evaluated in triage.  Pt complains of seeing intermittent haziness for the last 3-4 days. Reports mild headache on back of his head w/o dizziness or speech changes. Just states difficulty finding certain words. Denies weakness on one side of body or facial droop. Last eye appt  3 weeks ago.  Review of Systems  Positive: Vision changes Negative: dizziness  Physical Exam  BP (!) 176/88 (BP Location: Right Arm)   Pulse (!) 55   Temp 98 F (36.7 C)   Resp 18   SpO2 99%  Gen:   Awake, no distress   Resp:  Normal effort  MSK:   Moves extremities without difficulty  Other:  No neurodeficits on exam  Medical Decision Making  Medically screening exam initiated at 1:09 PM.  Appropriate orders placed.  Dollene Primrose was informed that the remainder of the evaluation will be completed by another provider, this initial triage assessment does not replace that evaluation, and the importance of remaining in the ED until their evaluation is complete.     Pete Pelt, [place] 09/17/23 1310

## 2023-09-17 NOTE — ED Triage Notes (Signed)
Patient BIB [facility] from dr's office for seeing "vapors" x 5 days. PA at provider's office was concerned for stroke and called EMS. VSS, A&Ox4, no pain reported. Ambulatory with EMS.

## 2023-09-18 DIAGNOSIS — H35363 Drusen (degenerative) of macula, bilateral: Secondary | ICD-10-CM | POA: Diagnosis not present

## 2023-09-18 DIAGNOSIS — H35372 Puckering of macula, left eye: Secondary | ICD-10-CM | POA: Diagnosis not present

## 2023-09-18 DIAGNOSIS — H353131 Nonexudative age-related macular degeneration, bilateral, early dry stage: Secondary | ICD-10-CM | POA: Diagnosis not present

## 2023-09-18 DIAGNOSIS — H401133 Primary open-angle glaucoma, bilateral, severe stage: Secondary | ICD-10-CM | POA: Diagnosis not present

## 2023-10-07 DIAGNOSIS — Z125 Encounter for screening for malignant neoplasm of prostate: Secondary | ICD-10-CM | POA: Diagnosis not present

## 2023-10-07 DIAGNOSIS — E232 Diabetes insipidus: Secondary | ICD-10-CM | POA: Diagnosis not present

## 2023-10-07 DIAGNOSIS — D497 Neoplasm of unspecified behavior of endocrine glands and other parts of nervous system: Secondary | ICD-10-CM | POA: Diagnosis not present

## 2023-10-07 DIAGNOSIS — E230 Hypopituitarism: Secondary | ICD-10-CM | POA: Diagnosis not present

## 2023-10-22 DIAGNOSIS — E230 Hypopituitarism: Secondary | ICD-10-CM | POA: Diagnosis not present

## 2023-10-22 DIAGNOSIS — I771 Stricture of artery: Secondary | ICD-10-CM | POA: Diagnosis not present

## 2023-10-22 DIAGNOSIS — Z7902 Long term (current) use of antithrombotics/antiplatelets: Secondary | ICD-10-CM | POA: Diagnosis not present

## 2023-10-22 DIAGNOSIS — Z7982 Long term (current) use of aspirin: Secondary | ICD-10-CM | POA: Diagnosis not present

## 2023-10-22 DIAGNOSIS — D751 Secondary polycythemia: Secondary | ICD-10-CM | POA: Diagnosis not present

## 2023-10-22 DIAGNOSIS — Z9181 History of falling: Secondary | ICD-10-CM | POA: Diagnosis not present

## 2023-10-22 DIAGNOSIS — Z23 Encounter for immunization: Secondary | ICD-10-CM | POA: Diagnosis not present

## 2023-10-22 DIAGNOSIS — Z0000 Encounter for general adult medical examination without abnormal findings: Secondary | ICD-10-CM | POA: Diagnosis not present

## 2023-10-22 DIAGNOSIS — I5032 Chronic diastolic (congestive) heart failure: Secondary | ICD-10-CM | POA: Diagnosis not present

## 2023-10-22 DIAGNOSIS — E232 Diabetes insipidus: Secondary | ICD-10-CM | POA: Diagnosis not present

## 2023-10-22 DIAGNOSIS — I110 Hypertensive heart disease with heart failure: Secondary | ICD-10-CM | POA: Diagnosis not present

## 2023-10-22 DIAGNOSIS — I651 Occlusion and stenosis of basilar artery: Secondary | ICD-10-CM | POA: Diagnosis not present

## 2023-12-18 DIAGNOSIS — H524 Presbyopia: Secondary | ICD-10-CM | POA: Diagnosis not present

## 2023-12-18 DIAGNOSIS — H52202 Unspecified astigmatism, left eye: Secondary | ICD-10-CM | POA: Diagnosis not present

## 2023-12-30 DIAGNOSIS — D225 Melanocytic nevi of trunk: Secondary | ICD-10-CM | POA: Diagnosis not present

## 2023-12-30 DIAGNOSIS — H401133 Primary open-angle glaucoma, bilateral, severe stage: Secondary | ICD-10-CM | POA: Diagnosis not present

## 2023-12-30 DIAGNOSIS — D2262 Melanocytic nevi of left upper limb, including shoulder: Secondary | ICD-10-CM | POA: Diagnosis not present

## 2023-12-30 DIAGNOSIS — D2261 Melanocytic nevi of right upper limb, including shoulder: Secondary | ICD-10-CM | POA: Diagnosis not present

## 2023-12-30 DIAGNOSIS — L905 Scar conditions and fibrosis of skin: Secondary | ICD-10-CM | POA: Diagnosis not present

## 2023-12-30 DIAGNOSIS — L821 Other seborrheic keratosis: Secondary | ICD-10-CM | POA: Diagnosis not present

## 2023-12-30 DIAGNOSIS — L570 Actinic keratosis: Secondary | ICD-10-CM | POA: Diagnosis not present

## 2023-12-30 DIAGNOSIS — Z85828 Personal history of other malignant neoplasm of skin: Secondary | ICD-10-CM | POA: Diagnosis not present

## 2024-03-18 DIAGNOSIS — H35363 Drusen (degenerative) of macula, bilateral: Secondary | ICD-10-CM | POA: Diagnosis not present

## 2024-03-18 DIAGNOSIS — H35372 Puckering of macula, left eye: Secondary | ICD-10-CM | POA: Diagnosis not present

## 2024-03-18 DIAGNOSIS — H401133 Primary open-angle glaucoma, bilateral, severe stage: Secondary | ICD-10-CM | POA: Diagnosis not present

## 2024-03-18 DIAGNOSIS — H40053 Ocular hypertension, bilateral: Secondary | ICD-10-CM | POA: Diagnosis not present

## 2024-03-18 DIAGNOSIS — H353131 Nonexudative age-related macular degeneration, bilateral, early dry stage: Secondary | ICD-10-CM | POA: Diagnosis not present

## 2024-04-06 DIAGNOSIS — Z9181 History of falling: Secondary | ICD-10-CM | POA: Diagnosis not present

## 2024-04-06 DIAGNOSIS — E230 Hypopituitarism: Secondary | ICD-10-CM | POA: Diagnosis not present

## 2024-04-06 DIAGNOSIS — E232 Diabetes insipidus: Secondary | ICD-10-CM | POA: Diagnosis not present

## 2024-04-06 DIAGNOSIS — D497 Neoplasm of unspecified behavior of endocrine glands and other parts of nervous system: Secondary | ICD-10-CM | POA: Diagnosis not present

## 2024-04-06 DIAGNOSIS — Z125 Encounter for screening for malignant neoplasm of prostate: Secondary | ICD-10-CM | POA: Diagnosis not present

## 2024-04-27 DIAGNOSIS — Z85828 Personal history of other malignant neoplasm of skin: Secondary | ICD-10-CM | POA: Diagnosis not present

## 2024-04-27 DIAGNOSIS — L821 Other seborrheic keratosis: Secondary | ICD-10-CM | POA: Diagnosis not present

## 2024-05-05 DIAGNOSIS — E230 Hypopituitarism: Secondary | ICD-10-CM | POA: Diagnosis not present

## 2024-05-05 NOTE — Therapy (Unsigned)
 OUTPATIENT PHYSICAL THERAPY LOWER EXTREMITY EVALUATION   Patient Name: Alexander Yoder MRN: [id number] DOB:Apr 17, 1930, 89 y.o., male Today's Date: 05/07/2024  END OF SESSION:  PT End of Session - 05/07/24 0917     Visit Number 1    Number of Visits 4    Date for PT Re-Evaluation 07/07/24    Authorization Type MCR    PT Start Time 0915    PT Stop Time 1000    PT Time Calculation (min) 45 min    Activity Tolerance Patient tolerated treatment well    Behavior During Therapy WFL for tasks assessed/performed          Past Medical History:  Diagnosis Date   Amblyopia of eye, right    Anxiety attack    Cancer (HCC)    prostate   Exotropia of right eye    Glaucoma    Hyperlipidemia    Hypothyroidism    Pituitary tumor    Past Surgical History:  Procedure Laterality Date   COLONOSCOPY     EYE SURGERY Bilateral    cataracts removed   PROSTATECTOMY  2000   STRABISMUS SURGERY Right 09/24/2019   Procedure: REPAIR STRABISMUS RIGHT EYE;  Surgeon: Ozella Blush, MD;  Location: [facility] OR;  Service: Ophthalmology;  Laterality: Right;   TONSILLECTOMY     TRANSPHENOIDAL / TRANSNASAL HYPOPHYSECTOMY / RESECTION PITUITARY TUMOR  1998, 2010   x2 -   WISDOM TOOTH EXTRACTION     Patient Active Problem List   Diagnosis Date Noted   COVID-19 01/19/2023   Syncope and collapse 01/19/2023   Lactic acidosis 01/19/2023   Hypocalcemia 01/19/2023   Chronic diastolic CHF (congestive heart failure) (HCC) 01/19/2023   Polycythemia 01/19/2023   Acute hyponatremia 09/07/2022   Fall at home, initial encounter 09/07/2022   Nausea & vomiting 09/07/2022   Hypothyroidism    Panhypopituitarism (HCC)    Adrenal insufficiency (HCC)    Diabetes insipidus (HCC)     PCP: Benedetta Bradley, MD   REFERRING PROVIDER: Gordy Lauber, MD  REFERRING DIAG: [phone] (ICD-10-CM) - History of falling  THERAPY DIAG:  Other abnormalities of gait and mobility  Repeated falls  Muscle weakness  (generalized)  Rationale for Evaluation and Treatment: Rehabilitation  ONSET DATE: chronic  SUBJECTIVE:   SUBJECTIVE STATEMENT: Patient reports some lethargy today as he recently took some medication today for sinus congestion.  Patient referred for balance assessment due to 2 falls related to vision issues.  Most recent fall occurred 3 months ago and previous 18 months ago.  PERTINENT HISTORY:  PAIN:  Are you having pain? No  PRECAUTIONS: Fall  RED FLAGS: None   WEIGHT BEARING RESTRICTIONS: No  FALLS:  Has patient fallen in last 6 months? Yes. Number of falls 2  LIVING ENVIRONMENT: Lives with: lives with their family Lives in: House/apartment Stairs: 3  Has following equipment at home: rails  OCCUPATION: retired  PLOF: Independent  PATIENT GOALS: To reduce my fall risk  NEXT MD VISIT: TBD  OBJECTIVE:  Note: Objective measures were completed at Evaluation unless otherwise noted.  DIAGNOSTIC FINDINGS: none  PATIENT SURVEYS:  Patient-specific activity scoring scheme (Point to one number):  0 represents "unable to perform." 10 represents "able to perform at prior level. 0 1 2 3 4 5 6 7 8 9  10 (Date and Score) Activity Initial  Activity Eval     Stairs   9    ambulation  10    Sit to stand 7  Total score = sum of the activity scores/number of activities Minimum detectable change (90%CI) for average score = 2 points Minimum detectable change (90%CI) for single activity score = 3 points PSFS developed by: [place] Spitz., & Binkley, J. (1995). Assessing disability and change on individual  patients: a report of a patient specific measure. Physiotherapy [place], 47, 161-096. Reproduced with the permission of the authors  Score: 26/30 67% functional  MUSCLE LENGTH: Not tested  POSTURE: rounded shoulders  PALPATION: deferred  LOWER EXTREMITY ROM:  Active ROM Right eval Left eval  Hip flexion    Hip extension    Hip  abduction    Hip adduction    Hip internal rotation    Hip external rotation    Knee flexion    Knee extension    Ankle dorsiflexion 5d 5d  Ankle plantarflexion    Ankle inversion    Ankle eversion     (Blank rows = not tested)  LOWER EXTREMITY MMT:  MMT Right eval Left eval  Hip flexion    Hip extension    Hip abduction    Hip adduction    Hip internal rotation    Hip external rotation    Knee flexion    Knee extension    Ankle dorsiflexion    Ankle plantarflexion    Ankle inversion    Ankle eversion     (Blank rows = not tested)  LOWER EXTREMITY SPECIAL TESTS:  N/a  FUNCTIONAL TESTS:  30 seconds chair stand test 13 reps w/o UE   GAIT: Distance walked: 53ft x2 Assistive device utilized: None Level of assistance: Complete Independence Comments: slow cadence                                                                                                                                TREATMENT: [facility] Adult PT Treatment:                                                DATE: 05/07/24 Eval Self Care: Additional minutes spent for educating on updated Therapeutic Home Exercise Program as well as comparing current status to condition at start of symptoms. This included exercises focusing on stretching, strengthening, with focus on eccentric aspects. Long term goals include an improvement in range of motion, strength, endurance as well as avoiding reinjury. Patient's frequency would include in 1-2 times a day, 3-5 times a week for a duration of 6-12 weeks. Proper technique shown and discussed handout in great detail. All questions were discussed and addressed.      PATIENT EDUCATION:  Education details: Discussed eval findings, rehab rationale and POC and patient is in agreement  Person educated: Patient Education method: Explanation and Handouts Education comprehension: verbalized understanding and needs further education  HOME EXERCISE PROGRAM: Access  Code:  [email or web address] URL: [email or web address] Date: 05/07/2024 Prepared by: Chiyoko Torrico  Patient Education - How to Prevent Falls - What You Can Do to Prevent Falls  ASSESSMENT:  CLINICAL IMPRESSION: Patient is a 89 y.o. male who was seen today for physical therapy evaluation and treatment for frequent falls and establishment of a fall prevention program.  Patient presents with good LE strength in knees and hips but exhibits strength deficits in DF ROM and strength.  Patient would benefit from further balance testing to determine if any underlying static or dynamic balance deficits are present.  OBJECTIVE IMPAIRMENTS: Abnormal gait, decreased activity tolerance, decreased balance, decreased endurance, decreased knowledge of use of DME, decreased mobility, difficulty walking, decreased strength, and decreased safety awareness.   ACTIVITY LIMITATIONS: carrying, lifting, standing, stairs, and locomotion level  PERSONAL FACTORS: Age, Fitness, and Past/current experiences are also affecting patient's functional outcome.   REHAB POTENTIAL: Good  CLINICAL DECISION MAKING: Stable/uncomplicated  EVALUATION COMPLEXITY: Low   GOALS: Goals reviewed with patient? No    SHORT TERM GOALS=LONG TERM GOALS: Target date: 06/30/2024   Patient to demonstrate independence in HEP  Baseline: TBD Goal status: INITIAL  2.  Increase B DF AROM to 10d Baseline: 5d B Goal status: INITIAL  3.  Patient will score at least 28/30 on PSFS to signify clinically meaningful improvement in functional abilities.   Baseline: 26/30 Goal status: INITIAL  4.  Assess BERG and set goal Baseline: TBD Goal status: INITIAL     PLAN:  PT FREQUENCY: 1-2x/week  PT DURATION: 4 weeks  PLANNED INTERVENTIONS: 97110-Therapeutic exercises, 97530- Therapeutic activity, 97112- Neuromuscular re-education, 97535- Self Care, 47829- Manual therapy, [phone]- Gait training, Patient/Family education, Balance training,  and Stair training  PLAN FOR NEXT SESSION: HEP review and update, manual techniques as appropriate, aerobic tasks, ROM and flexibility activities, strengthening and PREs, TPDN, gait and balance training as needed     Eldon Greenland, PT 05/07/2024, 9:59 AM

## 2024-05-07 ENCOUNTER — Ambulatory Visit: Attending: Internal Medicine

## 2024-05-07 DIAGNOSIS — M6281 Muscle weakness (generalized): Secondary | ICD-10-CM | POA: Insufficient documentation

## 2024-05-07 DIAGNOSIS — R296 Repeated falls: Secondary | ICD-10-CM | POA: Diagnosis not present

## 2024-05-07 DIAGNOSIS — R2689 Other abnormalities of gait and mobility: Secondary | ICD-10-CM | POA: Insufficient documentation

## 2024-05-08 DIAGNOSIS — J029 Acute pharyngitis, unspecified: Secondary | ICD-10-CM | POA: Diagnosis not present

## 2024-05-10 NOTE — Therapy (Deleted)
 OUTPATIENT PHYSICAL THERAPY LOWER EXTREMITY EVALUATION   Patient Name: MCCRAE SPECIALE MRN: [id number] DOB:Dec 04, 1929, 89 y.o., male Today's Date: 05/10/2024  END OF SESSION:    Past Medical History:  Diagnosis Date   Amblyopia of eye, right    Anxiety attack    Cancer (HCC)    prostate   Exotropia of right eye    Glaucoma    Hyperlipidemia    Hypothyroidism    Pituitary tumor    Past Surgical History:  Procedure Laterality Date   COLONOSCOPY     EYE SURGERY Bilateral    cataracts removed   PROSTATECTOMY  2000   STRABISMUS SURGERY Right 09/24/2019   Procedure: REPAIR STRABISMUS RIGHT EYE;  Surgeon: Tobie Factor, MD;  Location: [facility] OR;  Service: Ophthalmology;  Laterality: Right;   TONSILLECTOMY     TRANSPHENOIDAL / TRANSNASAL HYPOPHYSECTOMY / RESECTION PITUITARY TUMOR  1998, 2010   x2 -   WISDOM TOOTH EXTRACTION     Patient Active Problem List   Diagnosis Date Noted   COVID-19 01/19/2023   Syncope and collapse 01/19/2023   Lactic acidosis 01/19/2023   Hypocalcemia 01/19/2023   Chronic diastolic CHF (congestive heart failure) (HCC) 01/19/2023   Polycythemia 01/19/2023   Acute hyponatremia 09/07/2022   Fall at home, initial encounter 09/07/2022   Nausea & vomiting 09/07/2022   Hypothyroidism    Panhypopituitarism (HCC)    Adrenal insufficiency (HCC)    Diabetes insipidus (HCC)     PCP: Charlott Dorn LABOR, MD   REFERRING PROVIDER: Faythe Purchase, MD  REFERRING DIAG: Z91.81 (ICD-10-CM) - History of falling  THERAPY DIAG:  No diagnosis found.  Rationale for Evaluation and Treatment: Rehabilitation  ONSET DATE: chronic  SUBJECTIVE:   SUBJECTIVE STATEMENT: Patient reports some lethargy today as he recently took some medication today for sinus congestion.  Patient referred for balance assessment due to 2 falls related to vision issues.  Most recent fall occurred 3 months ago and previous 18 months ago.  PERTINENT HISTORY:  PAIN:  Are you having  pain? No  PRECAUTIONS: Fall  RED FLAGS: None   WEIGHT BEARING RESTRICTIONS: No  FALLS:  Has patient fallen in last 6 months? Yes. Number of falls 2  LIVING ENVIRONMENT: Lives with: lives with their family Lives in: House/apartment Stairs: 3  Has following equipment at home: rails  OCCUPATION: retired  PLOF: Independent  PATIENT GOALS: To reduce my fall risk  NEXT MD VISIT: TBD  OBJECTIVE:  Note: Objective measures were completed at Evaluation unless otherwise noted.  DIAGNOSTIC FINDINGS: none  PATIENT SURVEYS:  Patient-specific activity scoring scheme (Point to one number):  0 represents "unable to perform." 10 represents "able to perform at prior level. 0 1 2 3 4 5 6 7 8 9  10 (Date and Score) Activity Initial  Activity Eval     Stairs   9    ambulation  10    Sit to stand 7     Total score = sum of the activity scores/number of activities Minimum detectable change (90%CI) for average score = 2 points Minimum detectable change (90%CI) for single activity score = 3 points PSFS developed by: Rosalee MYRTIS Marvis KYM Charlet CHRISTELLA., & Binkley, J. (1995). Assessing disability and change on individual  patients: a report of a patient specific measure. Physiotherapy [place], 47, [phone]. Reproduced with the permission of the authors  Score: 26/30 67% functional  MUSCLE LENGTH: Not tested  POSTURE: rounded shoulders  PALPATION: deferred  LOWER EXTREMITY ROM:  Active ROM  Right eval Left eval  Hip flexion    Hip extension    Hip abduction    Hip adduction    Hip internal rotation    Hip external rotation    Knee flexion    Knee extension    Ankle dorsiflexion 5d 5d  Ankle plantarflexion    Ankle inversion    Ankle eversion     (Blank rows = not tested)  LOWER EXTREMITY MMT:  MMT Right eval Left eval  Hip flexion    Hip extension    Hip abduction    Hip adduction    Hip internal rotation    Hip external rotation    Knee flexion     Knee extension    Ankle dorsiflexion    Ankle plantarflexion    Ankle inversion    Ankle eversion     (Blank rows = not tested)  LOWER EXTREMITY SPECIAL TESTS:  N/a  FUNCTIONAL TESTS:  30 seconds chair stand test 13 reps w/o UE   GAIT: Distance walked: 29ft x2 Assistive device utilized: None Level of assistance: Complete Independence Comments: slow cadence                                                                                                                                TREATMENT: [facility] Adult PT Treatment:                                                DATE: 05/07/24 Eval Self Care: Additional minutes spent for educating on updated Therapeutic Home Exercise Program as well as comparing current status to condition at start of symptoms. This included exercises focusing on stretching, strengthening, with focus on eccentric aspects. Long term goals include an improvement in range of motion, strength, endurance as well as avoiding reinjury. Patient's frequency would include in 1-2 times a day, 3-5 times a week for a duration of 6-12 weeks. Proper technique shown and discussed handout in great detail. All questions were discussed and addressed.      PATIENT EDUCATION:  Education details: Discussed eval findings, rehab rationale and POC and patient is in agreement  Person educated: Patient Education method: Explanation and Handouts Education comprehension: verbalized understanding and needs further education  HOME EXERCISE PROGRAM: Access Code: [email or web address] URL: [email or web address] Date: 05/07/2024 Prepared by: Hanif Radin  Patient Education - How to Prevent Falls - What You Can Do to Prevent Falls  ASSESSMENT:  CLINICAL IMPRESSION: Patient is a 89 y.o. male who was seen today for physical therapy evaluation and treatment for frequent falls and establishment of a fall prevention program.  Patient presents with good LE strength in knees and hips but  exhibits strength deficits in DF ROM and strength.  Patient would benefit from further balance testing to determine if any underlying static or dynamic  balance deficits are present.  OBJECTIVE IMPAIRMENTS: Abnormal gait, decreased activity tolerance, decreased balance, decreased endurance, decreased knowledge of use of DME, decreased mobility, difficulty walking, decreased strength, and decreased safety awareness.   ACTIVITY LIMITATIONS: carrying, lifting, standing, stairs, and locomotion level  PERSONAL FACTORS: Age, Fitness, and Past/current experiences are also affecting patient's functional outcome.   REHAB POTENTIAL: Good  CLINICAL DECISION MAKING: Stable/uncomplicated  EVALUATION COMPLEXITY: Low   GOALS: Goals reviewed with patient? No    SHORT TERM GOALS=LONG TERM GOALS: Target date: 06/30/2024   Patient to demonstrate independence in HEP  Baseline: TBD Goal status: INITIAL  2.  Increase B DF AROM to 10d Baseline: 5d B Goal status: INITIAL  3.  Patient will score at least 28/30 on PSFS to signify clinically meaningful improvement in functional abilities.   Baseline: 26/30 Goal status: INITIAL  4.  Assess BERG and set goal Baseline: TBD Goal status: INITIAL     PLAN:  PT FREQUENCY: 1-2x/week  PT DURATION: 4 weeks  PLANNED INTERVENTIONS: 97110-Therapeutic exercises, 97530- Therapeutic activity, 97112- Neuromuscular re-education, 97535- Self Care, [id number]- Manual therapy, [phone]- Gait training, Patient/Family education, Balance training, and Stair training  PLAN FOR NEXT SESSION: HEP review and update, manual techniques as appropriate, aerobic tasks, ROM and flexibility activities, strengthening and PREs, TPDN, gait and balance training as needed     Reyes CHRISTELLA Kohut, PT 05/10/2024, 8:03 PM

## 2024-05-11 ENCOUNTER — Encounter

## 2024-05-11 DIAGNOSIS — Z9181 History of falling: Secondary | ICD-10-CM | POA: Diagnosis not present

## 2024-05-11 DIAGNOSIS — R6884 Jaw pain: Secondary | ICD-10-CM | POA: Diagnosis not present

## 2024-05-11 DIAGNOSIS — E2740 Unspecified adrenocortical insufficiency: Secondary | ICD-10-CM | POA: Diagnosis not present

## 2024-05-11 DIAGNOSIS — D751 Secondary polycythemia: Secondary | ICD-10-CM | POA: Diagnosis not present

## 2024-05-11 DIAGNOSIS — N1831 Chronic kidney disease, stage 3a: Secondary | ICD-10-CM | POA: Diagnosis not present

## 2024-05-11 DIAGNOSIS — E232 Diabetes insipidus: Secondary | ICD-10-CM | POA: Diagnosis not present

## 2024-05-11 DIAGNOSIS — I5032 Chronic diastolic (congestive) heart failure: Secondary | ICD-10-CM | POA: Diagnosis not present

## 2024-05-11 DIAGNOSIS — Z7902 Long term (current) use of antithrombotics/antiplatelets: Secondary | ICD-10-CM | POA: Diagnosis not present

## 2024-05-11 DIAGNOSIS — E230 Hypopituitarism: Secondary | ICD-10-CM | POA: Diagnosis not present

## 2024-05-11 DIAGNOSIS — D497 Neoplasm of unspecified behavior of endocrine glands and other parts of nervous system: Secondary | ICD-10-CM | POA: Diagnosis not present

## 2024-05-11 DIAGNOSIS — I110 Hypertensive heart disease with heart failure: Secondary | ICD-10-CM | POA: Diagnosis not present

## 2024-05-11 DIAGNOSIS — Z7982 Long term (current) use of aspirin: Secondary | ICD-10-CM | POA: Diagnosis not present

## 2024-05-13 ENCOUNTER — Other Ambulatory Visit: Payer: Self-pay

## 2024-05-13 ENCOUNTER — Emergency Department (HOSPITAL_COMMUNITY)

## 2024-05-13 ENCOUNTER — Inpatient Hospital Stay (HOSPITAL_COMMUNITY)
Admission: EM | Admit: 2024-05-13 | Discharge: 2024-05-16 | DRG: 392 | Disposition: A | Discharge status: Home Health | Attending: Internal Medicine | Admitting: Internal Medicine

## 2024-05-13 ENCOUNTER — Encounter (HOSPITAL_COMMUNITY): Payer: Self-pay | Admitting: Internal Medicine

## 2024-05-13 DIAGNOSIS — I5032 Chronic diastolic (congestive) heart failure: Secondary | ICD-10-CM | POA: Diagnosis not present

## 2024-05-13 DIAGNOSIS — Z79890 Hormone replacement therapy (postmenopausal): Secondary | ICD-10-CM

## 2024-05-13 DIAGNOSIS — Z8546 Personal history of malignant neoplasm of prostate: Secondary | ICD-10-CM

## 2024-05-13 DIAGNOSIS — Z888 Allergy status to other drugs, medicaments and biological substances status: Secondary | ICD-10-CM | POA: Diagnosis not present

## 2024-05-13 DIAGNOSIS — R42 Dizziness and giddiness: Secondary | ICD-10-CM | POA: Diagnosis not present

## 2024-05-13 DIAGNOSIS — A0811 Acute gastroenteropathy due to Norwalk agent: Principal | ICD-10-CM | POA: Diagnosis present

## 2024-05-13 DIAGNOSIS — Z7902 Long term (current) use of antithrombotics/antiplatelets: Secondary | ICD-10-CM

## 2024-05-13 DIAGNOSIS — Z8673 Personal history of transient ischemic attack (TIA), and cerebral infarction without residual deficits: Secondary | ICD-10-CM

## 2024-05-13 DIAGNOSIS — E039 Hypothyroidism, unspecified: Secondary | ICD-10-CM | POA: Diagnosis present

## 2024-05-13 DIAGNOSIS — E872 Acidosis, unspecified: Principal | ICD-10-CM | POA: Diagnosis present

## 2024-05-13 DIAGNOSIS — E860 Dehydration: Secondary | ICD-10-CM | POA: Diagnosis present

## 2024-05-13 DIAGNOSIS — Z880 Allergy status to penicillin: Secondary | ICD-10-CM

## 2024-05-13 DIAGNOSIS — H409 Unspecified glaucoma: Secondary | ICD-10-CM | POA: Diagnosis not present

## 2024-05-13 DIAGNOSIS — N179 Acute kidney failure, unspecified: Secondary | ICD-10-CM | POA: Diagnosis not present

## 2024-05-13 DIAGNOSIS — D751 Secondary polycythemia: Secondary | ICD-10-CM | POA: Diagnosis not present

## 2024-05-13 DIAGNOSIS — E663 Overweight: Secondary | ICD-10-CM | POA: Diagnosis not present

## 2024-05-13 DIAGNOSIS — R531 Weakness: Secondary | ICD-10-CM

## 2024-05-13 DIAGNOSIS — Z6826 Body mass index (BMI) 26.0-26.9, adult: Secondary | ICD-10-CM

## 2024-05-13 DIAGNOSIS — Z91010 Allergy to peanuts: Secondary | ICD-10-CM | POA: Diagnosis not present

## 2024-05-13 DIAGNOSIS — I2489 Other forms of acute ischemic heart disease: Secondary | ICD-10-CM | POA: Diagnosis present

## 2024-05-13 DIAGNOSIS — E2740 Unspecified adrenocortical insufficiency: Secondary | ICD-10-CM | POA: Diagnosis present

## 2024-05-13 DIAGNOSIS — E876 Hypokalemia: Secondary | ICD-10-CM | POA: Diagnosis not present

## 2024-05-13 DIAGNOSIS — R112 Nausea with vomiting, unspecified: Secondary | ICD-10-CM | POA: Diagnosis not present

## 2024-05-13 DIAGNOSIS — E8809 Other disorders of plasma-protein metabolism, not elsewhere classified: Secondary | ICD-10-CM | POA: Diagnosis present

## 2024-05-13 DIAGNOSIS — Z1152 Encounter for screening for COVID-19: Secondary | ICD-10-CM

## 2024-05-13 DIAGNOSIS — E271 Primary adrenocortical insufficiency: Secondary | ICD-10-CM | POA: Diagnosis not present

## 2024-05-13 DIAGNOSIS — Z79899 Other long term (current) drug therapy: Secondary | ICD-10-CM

## 2024-05-13 DIAGNOSIS — Z7982 Long term (current) use of aspirin: Secondary | ICD-10-CM

## 2024-05-13 DIAGNOSIS — E785 Hyperlipidemia, unspecified: Secondary | ICD-10-CM | POA: Diagnosis present

## 2024-05-13 DIAGNOSIS — R197 Diarrhea, unspecified: Secondary | ICD-10-CM | POA: Diagnosis not present

## 2024-05-13 DIAGNOSIS — E871 Hypo-osmolality and hyponatremia: Secondary | ICD-10-CM | POA: Diagnosis present

## 2024-05-13 DIAGNOSIS — R110 Nausea: Secondary | ICD-10-CM | POA: Diagnosis not present

## 2024-05-13 LAB — CBC WITH DIFFERENTIAL/PLATELET
Abs Immature Granulocytes: 0.05 10*3/uL (ref 0.00–0.07)
Basophils Absolute: 0.1 10*3/uL (ref 0.0–0.1)
Basophils Relative: 1 %
Eosinophils Absolute: 0.2 10*3/uL (ref 0.0–0.5)
Eosinophils Relative: 2 %
HCT: 57.4 % — ABNORMAL HIGH (ref 39.0–52.0)
Hemoglobin: 19.9 g/dL — ABNORMAL HIGH (ref 13.0–17.0)
Immature Granulocytes: 1 %
Lymphocytes Relative: 11 %
Lymphs Abs: 1 10*3/uL (ref 0.7–4.0)
MCH: 28.4 pg (ref 26.0–34.0)
MCHC: 34.7 g/dL (ref 30.0–36.0)
MCV: 81.9 fL (ref 80.0–100.0)
Monocytes Absolute: 0.8 10*3/uL (ref 0.1–1.0)
Monocytes Relative: 8 %
Neutro Abs: 7.3 10*3/uL (ref 1.7–7.7)
Neutrophils Relative %: 77 %
Platelets: 283 10*3/uL (ref 150–400)
RBC: 7.01 MIL/uL — ABNORMAL HIGH (ref 4.22–5.81)
RDW: 15.8 % — ABNORMAL HIGH (ref 11.5–15.5)
WBC: 9.4 10*3/uL (ref 4.0–10.5)
nRBC: 0 % (ref 0.0–0.2)

## 2024-05-13 LAB — I-STAT CG4 LACTIC ACID, ED
Lactic Acid, Venous: 2.5 mmol/L (ref 0.5–1.9)
Lactic Acid, Venous: 6.8 mmol/L (ref 0.5–1.9)
Lactic Acid, Venous: 2.1 mmol/L (ref 0.5–1.9)

## 2024-05-13 LAB — COMPREHENSIVE METABOLIC PANEL WITH GFR
ALT: 31 U/L (ref 0–44)
AST: 36 U/L (ref 15–41)
Albumin: 3.2 g/dL — ABNORMAL LOW (ref 3.5–5.0)
Alkaline Phosphatase: 47 U/L (ref 38–126)
Anion gap: 14 (ref 5–15)
BUN: 17 mg/dL (ref 8–23)
CO2: 20 mmol/L — ABNORMAL LOW (ref 22–32)
Calcium: 8.2 mg/dL — ABNORMAL LOW (ref 8.9–10.3)
Chloride: 101 mmol/L (ref 98–111)
Creatinine, Ser: 1.31 mg/dL — ABNORMAL HIGH (ref 0.61–1.24)
GFR, Estimated: 51 mL/min — ABNORMAL LOW (ref >60)
Glucose, Bld: 68 mg/dL — ABNORMAL LOW (ref 70–99)
Potassium: 4.2 mmol/L (ref 3.5–5.1)
Sodium: 135 mmol/L (ref 135–145)
Total Bilirubin: 0.7 mg/dL (ref 0.0–1.2)
Total Protein: 5.7 g/dL — ABNORMAL LOW (ref 6.5–8.1)

## 2024-05-13 LAB — C-REACTIVE PROTEIN: CRP: 15.2 mg/dL — ABNORMAL HIGH (ref <1.0)

## 2024-05-13 LAB — RESP PANEL BY RT-PCR (RSV, FLU A&B, COVID)  RVPGX2
Influenza A by PCR: NEGATIVE
Influenza B by PCR: NEGATIVE
Resp Syncytial Virus by PCR: NEGATIVE
SARS Coronavirus 2 by RT PCR: NEGATIVE

## 2024-05-13 LAB — C DIFFICILE QUICK SCREEN W PCR REFLEX
C Diff antigen: NEGATIVE
C Diff interpretation: NOT DETECTED
C Diff toxin: NEGATIVE

## 2024-05-13 LAB — GAMMA GT: GGT: 25 U/L (ref 7–50)

## 2024-05-13 LAB — PSA: Prostatic Specific Antigen: 0.27 ng/mL (ref 0.00–4.00)

## 2024-05-13 LAB — GLUCOSE, CAPILLARY
Glucose-Capillary: 107 mg/dL — ABNORMAL HIGH (ref 70–99)
Glucose-Capillary: 97 mg/dL (ref 70–99)

## 2024-05-13 LAB — ETHANOL: Alcohol, Ethyl (B): <15 mg/dL (ref <15)

## 2024-05-13 LAB — T4, FREE: Free T4: 0.91 ng/dL (ref 0.61–1.12)

## 2024-05-13 LAB — TSH: TSH: 0.036 u[IU]/mL — ABNORMAL LOW (ref 0.350–4.500)

## 2024-05-13 LAB — LIPASE, BLOOD: Lipase: 29 U/L (ref 11–51)

## 2024-05-13 LAB — CORTISOL: Cortisol, Plasma: 7.6 ug/dL

## 2024-05-13 MED ORDER — TIMOLOL MALEATE 0.5 % OP SOLN
1.0000 [drp] | Freq: Two times a day (BID) | OPHTHALMIC | Status: DC
  Administered 2024-05-13 – 2024-05-16 (×6): 1 [drp] via OPHTHALMIC
  Filled 2024-05-13: qty 5

## 2024-05-13 MED ORDER — VITAMIN D 25 MCG (1000 UNIT) PO TABS
2000.0000 [IU] | ORAL_TABLET | Freq: Every day | ORAL | Status: DC
  Administered 2024-05-14 – 2024-05-16 (×3): 2000 [IU] via ORAL
  Filled 2024-05-13 (×3): qty 2

## 2024-05-13 MED ORDER — SODIUM CHLORIDE 0.9% FLUSH: 3.0000 mL | INTRAVENOUS | Status: DC | PRN

## 2024-05-13 MED ORDER — LEVOTHYROXINE SODIUM 100 MCG PO TABS: 100.0000 ug | ORAL_TABLET | Freq: Every day | ORAL | Status: DC

## 2024-05-13 MED ORDER — LEVOTHYROXINE SODIUM 112 MCG PO TABS
112.0000 ug | ORAL_TABLET | Freq: Every day | ORAL | Status: DC
  Administered 2024-05-14 – 2024-05-16 (×3): 112 ug via ORAL
  Filled 2024-05-13 (×3): qty 1

## 2024-05-13 MED ORDER — ACETAMINOPHEN 325 MG PO TABS
650.0000 mg | ORAL_TABLET | Freq: Four times a day (QID) | ORAL | Status: DC | PRN
  Administered 2024-05-15: 650 mg via ORAL
  Filled 2024-05-13: qty 2

## 2024-05-13 MED ORDER — BRIMONIDINE TARTRATE 0.15 % OP SOLN
1.0000 [drp] | Freq: Two times a day (BID) | OPHTHALMIC | Status: DC
  Administered 2024-05-13 – 2024-05-16 (×6): 1 [drp] via OPHTHALMIC
  Filled 2024-05-13: qty 5

## 2024-05-13 MED ORDER — SODIUM CHLORIDE 0.9 % IV SOLN: INTRAVENOUS | Status: DC

## 2024-05-13 MED ORDER — SODIUM CHLORIDE 0.9 % IV SOLN
2.0000 g | INTRAVENOUS | Status: DC
  Administered 2024-05-13 – 2024-05-14 (×2): 2 g via INTRAVENOUS
  Filled 2024-05-13 (×2): qty 20

## 2024-05-13 MED ORDER — HYDROCORTISONE SOD SUC (PF) 100 MG IJ SOLR
100.0000 mg | Freq: Once | INTRAMUSCULAR | Status: AC
  Administered 2024-05-13: 100 mg via INTRAVENOUS
  Filled 2024-05-13: qty 2

## 2024-05-13 MED ORDER — LATANOPROST 0.005 % OP SOLN
1.0000 [drp] | Freq: Every day | OPHTHALMIC | Status: DC
  Administered 2024-05-13 – 2024-05-15 (×3): 1 [drp] via OPHTHALMIC
  Filled 2024-05-13: qty 2.5

## 2024-05-13 MED ORDER — DEXTROSE 5 % AND 0.9 % NACL IV BOLUS: 500.0000 mL | Freq: Once | INTRAVENOUS | Status: DC

## 2024-05-13 MED ORDER — ALBUTEROL SULFATE (2.5 MG/3ML) 0.083% IN NEBU: 3.0000 mL | INHALATION_SOLUTION | Freq: Four times a day (QID) | RESPIRATORY_TRACT | Status: DC | PRN

## 2024-05-13 MED ORDER — MORPHINE SULFATE (PF) 2 MG/ML IV SOLN
2.0000 mg | INTRAVENOUS | Status: DC | PRN
  Administered 2024-05-13: 2 mg via INTRAVENOUS
  Filled 2024-05-13: qty 1

## 2024-05-13 MED ORDER — CLOPIDOGREL BISULFATE 75 MG PO TABS
75.0000 mg | ORAL_TABLET | Freq: Every day | ORAL | Status: DC
  Administered 2024-05-13 – 2024-05-16 (×4): 75 mg via ORAL
  Filled 2024-05-13 (×4): qty 1

## 2024-05-13 MED ORDER — METRONIDAZOLE 500 MG/100ML IV SOLN
500.0000 mg | Freq: Once | INTRAVENOUS | Status: AC
  Administered 2024-05-13: 500 mg via INTRAVENOUS
  Filled 2024-05-13: qty 100

## 2024-05-13 MED ORDER — IOHEXOL 350 MG/ML SOLN
75.0000 mL | Freq: Once | INTRAVENOUS | Status: AC | PRN
  Administered 2024-05-13: 75 mL via INTRAVENOUS

## 2024-05-13 MED ORDER — LACTATED RINGERS IV BOLUS
1000.0000 mL | Freq: Once | INTRAVENOUS | Status: AC
  Administered 2024-05-13: 1000 mL via INTRAVENOUS

## 2024-05-13 MED ORDER — ONDANSETRON HCL 4 MG/2ML IJ SOLN
4.0000 mg | Freq: Once | INTRAMUSCULAR | Status: AC
  Administered 2024-05-13: 4 mg via INTRAVENOUS
  Filled 2024-05-13: qty 2

## 2024-05-13 MED ORDER — PANTOPRAZOLE SODIUM 40 MG IV SOLR
40.0000 mg | Freq: Two times a day (BID) | INTRAVENOUS | Status: DC
  Administered 2024-05-13 – 2024-05-15 (×4): 40 mg via INTRAVENOUS
  Filled 2024-05-13 (×4): qty 10

## 2024-05-13 MED ORDER — ACETAMINOPHEN 650 MG RE SUPP: 650.0000 mg | Freq: Four times a day (QID) | RECTAL | Status: DC | PRN

## 2024-05-13 MED ORDER — HYDROCORTISONE 10 MG PO TABS
10.0000 mg | ORAL_TABLET | Freq: Every day | ORAL | Status: DC
  Administered 2024-05-14 – 2024-05-15 (×2): 10 mg via ORAL
  Filled 2024-05-13 (×3): qty 1

## 2024-05-13 MED ORDER — B COMPLEX-C PO TABS
1.0000 | ORAL_TABLET | Freq: Every day | ORAL | Status: DC
  Administered 2024-05-14 – 2024-05-16 (×3): 1 via ORAL
  Filled 2024-05-13 (×3): qty 1

## 2024-05-13 MED ORDER — HEPARIN SODIUM (PORCINE) 5000 UNIT/ML IJ SOLN
5000.0000 [IU] | Freq: Three times a day (TID) | INTRAMUSCULAR | Status: DC
  Administered 2024-05-13 – 2024-05-16 (×9): 5000 [IU] via SUBCUTANEOUS
  Filled 2024-05-13 (×9): qty 1

## 2024-05-13 MED ORDER — METRONIDAZOLE 500 MG/100ML IV SOLN
500.0000 mg | Freq: Two times a day (BID) | INTRAVENOUS | Status: DC
  Administered 2024-05-13 – 2024-05-14 (×2): 500 mg via INTRAVENOUS
  Filled 2024-05-13 (×2): qty 100

## 2024-05-13 MED ORDER — BRIMONIDINE TARTRATE 0.2 % OP SOLN
1.0000 [drp] | Freq: Two times a day (BID) | OPHTHALMIC | Status: DC
  Administered 2024-05-13 – 2024-05-16 (×6): 1 [drp] via OPHTHALMIC
  Filled 2024-05-13: qty 5

## 2024-05-13 MED ORDER — BRIMONIDINE TARTRATE-TIMOLOL 0.2-0.5 % OP SOLN: 1.0000 [drp] | Freq: Two times a day (BID) | OPHTHALMIC | Status: DC

## 2024-05-13 MED ORDER — SODIUM CHLORIDE 0.9% FLUSH
3.0000 mL | Freq: Two times a day (BID) | INTRAVENOUS | Status: DC
  Administered 2024-05-13 – 2024-05-16 (×4): 3 mL via INTRAVENOUS

## 2024-05-13 MED ORDER — LOPERAMIDE HCL 2 MG PO CAPS
4.0000 mg | ORAL_CAPSULE | ORAL | Status: AC | PRN
  Administered 2024-05-13 – 2024-05-16 (×2): 4 mg via ORAL
  Filled 2024-05-13 (×2): qty 2

## 2024-05-13 MED ORDER — SODIUM CHLORIDE 0.9% FLUSH
3.0000 mL | Freq: Two times a day (BID) | INTRAVENOUS | Status: DC
  Administered 2024-05-16: 10 mL via INTRAVENOUS

## 2024-05-13 MED ORDER — HYDROCORTISONE 10 MG PO TABS
20.0000 mg | ORAL_TABLET | Freq: Every morning | ORAL | Status: DC
  Administered 2024-05-14 – 2024-05-16 (×3): 20 mg via ORAL
  Filled 2024-05-13 (×4): qty 2

## 2024-05-13 MED ORDER — DESMOPRESSIN ACETATE 0.1 MG PO TABS
0.1000 mg | ORAL_TABLET | Freq: Two times a day (BID) | ORAL | Status: DC
  Administered 2024-05-13 – 2024-05-16 (×6): 0.1 mg via ORAL
  Filled 2024-05-13 (×7): qty 1

## 2024-05-13 MED ORDER — ASPIRIN 81 MG PO TBEC
81.0000 mg | DELAYED_RELEASE_TABLET | Freq: Every day | ORAL | Status: DC
  Administered 2024-05-13 – 2024-05-16 (×4): 81 mg via ORAL
  Filled 2024-05-13 (×4): qty 1

## 2024-05-13 MED ORDER — DEXTROSE 50 % IV SOLN
1.0000 | Freq: Once | INTRAVENOUS | Status: AC
  Administered 2024-05-13: 50 mL via INTRAVENOUS
  Filled 2024-05-13: qty 50

## 2024-05-13 NOTE — ED Notes (Signed)
 Phlebotomy at bedside.

## 2024-05-13 NOTE — ED Notes (Signed)
 MD notified 1 set culture obtained and unable to obtain 2nd set.

## 2024-05-13 NOTE — Plan of Care (Signed)
   Problem: Education: Goal: Knowledge of General Education information will improve Description Including pain rating scale, medication(s)/side effects and non-pharmacologic comfort measures Outcome: Progressing

## 2024-05-13 NOTE — Progress Notes (Signed)
 New Admission Note:  Arrival Method: Stretcher Mental Orientation: Alert and oriented x 3-4 Telemetry: Box 22 Assessment: Completed Skin: Warm and dry IV: NSL Pain: Denies Tubes: N/A Safety Measures: Safety Fall Prevention Plan initiated.  Admission: Completed 5 M  Orientation: Patient has been orientated to the room, unit and the staff. Welcome booklet given.  Family: N/A  Orders have been reviewed and implemented. Will continue to monitor the patient. Call light has been placed within reach and bed alarm has been activated.   Durwood Dee BSN, RN  Phone Number: [phone]

## 2024-05-13 NOTE — ED Notes (Signed)
 Got pt cleaned up

## 2024-05-13 NOTE — ED Notes (Signed)
 Wasn't able to obtain 2nd set of cultures.

## 2024-05-13 NOTE — ED Notes (Signed)
 Son at bedside.

## 2024-05-13 NOTE — ED Provider Notes (Signed)
 [place] EMERGENCY DEPARTMENT AT [facility] Provider Note   CSN: [id number] Arrival date & time: 05/13/24  9050     Patient presents with: Nausea   Alexander Yoder is a 89 y.o. male.   89 year old male with past medical history of hyperlipidemia, diabetes insipidus, and prostate cancer presenting to the emergency department today with intermittent abdominal discomfort, nausea, vomiting, and diarrhea.  This been going on now for the past few days.  The patient was feeling weak to the point this morning that he thought that he was going to pass out.  He was able to sit down on the floor but then was feeling too weak to get back up.  He was febrile here on arrival.  Reports that he has taken some antibiotics recently but cannot say which ones or for what.  He is reporting some intermittent abdominal cramping with this.        Prior to Admission medications   Medication Sig Start Date End Date Taking? Authorizing Provider  acetaminophen  (TYLENOL ) 325 MG tablet Take 2 tablets (650 mg total) by mouth every 6 (six) hours as needed for mild pain (or Fever >/= 101). 09/10/22   [name], Brayton RAMAN, MD  albuterol  (VENTOLIN  HFA) 108 (90 Base) MCG/ACT inhaler Inhale 2 puffs into the lungs every 6 (six) hours as needed for wheezing or shortness of breath. 01/21/23   Dennise Lavada POUR, MD  aspirin  EC 81 MG tablet Take 81 mg by mouth daily.    [provider]  B Complex-C (B-COMPLEX WITH VITAMIN C) tablet Take 1 tablet by mouth daily.    [provider]  brimonidine  (ALPHAGAN ) 0.15 % ophthalmic solution Place 1 drop into the right eye 2 (two) times daily.    [provider]  Cholecalciferol  (VITAMIN D ) 50 MCG (2000 UT) CAPS Take 2,000 Units by mouth daily.    [provider]  clopidogrel  (PLAVIX ) 75 MG tablet Take 75 mg by mouth daily. 03/29/21   [provider]  COMBIGAN  0.2-0.5 % ophthalmic solution Place 1 drop into the left eye 2 (two) times  daily. 08/20/22   [provider]  desmopressin  (DDAVP ) 0.1 MG tablet Take 1 tablet (0.1 mg total) by mouth 2 (two) times daily. 09/10/22   [name], Brayton RAMAN, MD  hydrocortisone  (CORTEF ) 10 MG tablet Take  2 tablets (20mg ) by mouth in the morning, and 1 tablet (10mg ) in the evening. 01/27/23   Singh, Prashant K, MD  latanoprost  (XALATAN ) 0.005 % ophthalmic solution Place 1 drop into both eyes at bedtime.    [provider]  levothyroxine  (SYNTHROID ) 100 MCG tablet Take 1 tablet (100 mcg total) by mouth daily before breakfast. 01/22/23   Singh, Prashant K, MD  Omega-3 Fatty Acids (FISH OIL) 1000 MG CAPS Take 1,000 mg by mouth daily.    [provider]  testosterone  cypionate (DEPOTESTOSTERONE CYPIONATE) 200 MG/ML injection Inject 0.7 mLs into the muscle every 14 (fourteen) days. 10/31/20   [provider]  Turmeric (QC TUMERIC COMPLEX PO) Take 1 capsule by mouth daily.    [provider]    Allergies: Peanut-containing drug products, Penicillins, and Rosuvastatin calcium     Review of Systems  Gastrointestinal:  Positive for abdominal pain, diarrhea, nausea and vomiting.  All other systems reviewed and are negative.   Updated Vital Signs BP 111/78 (BP Location: Right Arm)   Pulse 87   Temp 99.6 F (37.6 C) (Oral)   Resp 20   Ht 5' 10 (  1.778 m)   Wt 79.8 kg   SpO2 98%   BMI 25.24 kg/m   Physical Exam Vitals and nursing note reviewed.   Gen: NAD Eyes: PERRL, EOMI HEENT: no oropharyngeal swelling, dry mucous membranes Neck: trachea midline Resp: clear to auscultation bilaterally Card: RRR, no murmurs, rubs, or gallops Abd: Mild diffuse tenderness with no guarding or rebound Extremities: no calf tenderness, no edema Vascular: 2+ radial pulses bilaterally, 2+ DP pulses bilaterally Neuro: No focal deficits Skin: no rashes Psyc: acting appropriately   (all labs ordered are listed, but only abnormal results are displayed) Labs Reviewed   CBC WITH DIFFERENTIAL/PLATELET - Abnormal; Notable for the following components:      Result Value   RBC 7.01 (*)    Hemoglobin 19.9 (*)    HCT 57.4 (*)    RDW 15.8 (*)    All other components within normal limits  COMPREHENSIVE METABOLIC PANEL WITH GFR - Abnormal; Notable for the following components:   CO2 20 (*)    Glucose, Bld 68 (*)    Creatinine, Ser 1.31 (*)    Calcium  8.2 (*)    Total Protein 5.7 (*)    Albumin 3.2 (*)    GFR, Estimated 51 (*)    All other components within normal limits  I-STAT CG4 LACTIC ACID, ED - Abnormal; Notable for the following components:   Lactic Acid, Venous 2.5 (*)    All other components within normal limits  I-STAT CG4 LACTIC ACID, ED - Abnormal; Notable for the following components:   Lactic Acid, Venous 6.8 (*)    All other components within normal limits  C DIFFICILE QUICK SCREEN W PCR REFLEX    RESP PANEL BY RT-PCR (RSV, FLU A&B, COVID)  [id number]  CULTURE, BLOOD (ROUTINE X 2)  CULTURE, BLOOD (ROUTINE X 2)  LIPASE, BLOOD  URINALYSIS, ROUTINE W REFLEX MICROSCOPIC    EKG: None  Radiology: CT ABDOMEN PELVIS W CONTRAST Result Date: 05/13/2024 CLINICAL DATA:  Abdominal pain. EXAM: CT ABDOMEN AND PELVIS WITH CONTRAST TECHNIQUE: Multidetector CT imaging of the abdomen and pelvis was performed using the standard protocol following bolus administration of intravenous contrast. RADIATION DOSE REDUCTION: This exam was performed according to the departmental dose-optimization program which includes automated exposure control, adjustment of the mA and/or kV according to patient size and/or use of iterative reconstruction technique. CONTRAST:  75mL OMNIPAQUE IOHEXOL 350 MG/ML SOLN COMPARISON:  CT dated 02/13/2020. FINDINGS: Lower chest: The visualized lung bases are clear. No intra-abdominal free air or free fluid. Hepatobiliary: The liver is unremarkable. No dilatation. Multiple gallstones. No pericholecystic fluid or evidence of acute cholecystitis by  CT. Pancreas: Unremarkable. No pancreatic ductal dilatation or surrounding inflammatory changes. Spleen: Normal in size without focal abnormality. Adrenals/Urinary Tract: The adrenal glands are unremarkable. There is no hydronephrosis on either side. There is symmetric enhancement and excretion of contrast by both kidneys. The visualized ureters and urinary bladder appear unremarkable. Stomach/Bowel: There is loose stool throughout the colon consistent with diarrheal state. There is no bowel obstruction or active inflammation. The appendix is normal. Vascular/Lymphatic: Mild aortoiliac atherosclerotic disease. The IVC is unremarkable. No portal venous gas. There is no adenopathy. Improved nonspecific upper mesentery stranding. Reproductive: Prostatectomy. Other: Midline vertical anterior pelvic wall incisional scar. Musculoskeletal: Osteopenia with degenerative changes. No acute osseous pathology. IMPRESSION: 1. Diarrheal state. Mesh in correlation with clinical exam and stool cultures recommended. No bowel obstruction. Normal appendix. 2. Cholelithiasis. 3.  Aortic Atherosclerosis (ICD10-I70.0). Electronically Signed   By: Vanetta  Radparvar M.D.   On: 05/13/2024 14:54     Procedures   Medications Ordered in the ED  cefTRIAXone  (ROCEPHIN ) 2 g in sodium chloride  0.9 % 100 mL IVPB (0 g Intravenous Stopped 05/13/24 1426)  lactated ringers  bolus 1,000 mL (0 mLs Intravenous Stopped 05/13/24 1350)  ondansetron  (ZOFRAN ) injection 4 mg (4 mg Intravenous Given 05/13/24 1036)  lactated ringers  bolus 1,000 mL (1,000 mLs Intravenous New Bag/Given 05/13/24 1450)  metroNIDAZOLE (FLAGYL) IVPB 500 mg (0 mg Intravenous Stopped 05/13/24 1531)  iohexol (OMNIPAQUE) 350 MG/ML injection 75 mL (75 mLs Intravenous Contrast Given 05/13/24 1448)  dextrose 50 % solution 50 mL (50 mLs Intravenous Given 05/13/24 1449)    Clinical Course as of 05/13/24 1531  Wed May 13, 2024  1520 Handoff ART 89 yo/f  Fever, diarrhea at home Near  syncope when on toilet Cdiff was neg Afebrile here LA 6.8, Cr 1.3 [SG]    Clinical Course User Index [SG] Elnor Jayson LABOR, DO                                 Medical Decision Making 89 year old male with past medical history of diabetes insipidus and prostate cancer presenting to the emergency department today with generalized weakness in the setting of nausea, vomiting, and diarrhea over the past 4 days.  The patient is febrile here.  Will further evaluate him here with basic labs as well as a lactic acid to screen for septic shock.  The patient's blood pressures are stable here.  Unclear if this is due to C. difficile or perhaps a viral syndrome but will obtain a CT scan of his abdomen to evaluate for appendicitis, diverticulitis, colitis, or other intra-abdominal pathology.  Will hold off on broad-spectrum antibiotics at this time as C. difficile is certainly higher on the differential as he is reporting some antibiotic use recently.  I will reevaluate for ultimate disposition.  The patient's initial lactate was within normal limits and he did not not have a leukocytosis.  On repeat his lactate had increased to 6.8.  The patient was given an additional liter of fluid.  This along with the liter he received with medics should put him at a little more than 30 mL/kg.  His blood pressures remained stable here.  He is covered with Zosyn as soon as his elevated lactate was noted.  The patient CT scan does not show any concerning findings but does show diarrheal illness.  Calls placed for admission due to his generalized weakness and lactic acidosis.  Amount and/or Complexity of Data Reviewed Labs: ordered. Radiology: ordered.  Risk Prescription drug management. Decision regarding hospitalization.        Final diagnoses:  Lactic acidosis  Generalized weakness    ED Discharge Orders     None          Ula Prentice SAUNDERS, MD 05/13/24 1531

## 2024-05-13 NOTE — ED Notes (Signed)
 Patient transported to CT

## 2024-05-13 NOTE — H&P (Signed)
 History and Physical    Patient: Alexander Yoder FMW:[id number] DOB: [birth year] DOA: 05/13/2024 DOS: the patient was seen and examined on 05/13/2024 . PCP: Charlott Dorn LABOR, MD  Patient coming from: Home Chief complaint: Chief Complaint  Patient presents with   Nausea   HPI:  Alexander Yoder is a 89 y.o. 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  Along with abdominal pain.  Patient states that started about 6-7 days ago when he ate tainted food, he ate tomato mayonnaise and bread.  HPI is limited as patient is awake but somewhat attentive and oriented but has difficulty focusing and giving me answers clearly suspect also from the dehydration and the lactic acidosis.   ED Course: Pt in ed at bedside  is awake but somewhat lethargic ill-appearing and dry.  Nonfocal spontaneously moves around a little bit restless but not agitated or uncooperative. Vital signs in the ED were notable for the following:  Vitals:   05/13/24 1430 05/13/24 1500 05/13/24 1715 05/13/24 1953  BP: 129/86  (!) 158/84 (!) 138/93  Pulse:  95 91 99  Temp:    98.5 F (36.9 C)  Resp: 17 (!) 22 12 20   Height:      Weight:      SpO2:  95% 97% 99%  TempSrc:    Oral  BMI (Calculated):      >>ED evaluation thus far shows:     >>While in the ED patient received the following: Medications  cefTRIAXone  (ROCEPHIN ) 2 g in sodium chloride  0.9 % 100 mL IVPB (0 g Intravenous Stopped 05/13/24 1426)  lactated ringers  bolus 1,000 mL (0 mLs Intravenous Stopped 05/13/24 1350)  ondansetron  (ZOFRAN ) injection 4 mg (4 mg Intravenous Given 05/13/24 1036)  lactated ringers  bolus 1,000 mL (1,000 mLs Intravenous New Bag/Given 05/13/24 1450)  metroNIDAZOLE (FLAGYL) IVPB 500 mg (0 mg Intravenous Stopped 05/13/24 1531)  iohexol (OMNIPAQUE) 350 MG/ML injection 75  mL (75 mLs Intravenous Contrast Given 05/13/24 1448)  dextrose 50 % solution 50 mL (50 mLs Intravenous Given 05/13/24 1449)   ROS Past Medical History:  Diagnosis Date   Amblyopia of eye, right    Anxiety attack    Cancer (HCC)    prostate   Exotropia of right eye    Glaucoma    Hyperlipidemia    Hypothyroidism    Pituitary tumor    Past Surgical History:  Procedure Laterality Date   COLONOSCOPY     EYE SURGERY Bilateral    cataracts removed   PROSTATECTOMY  2000   STRABISMUS SURGERY Right 09/24/2019   Procedure: REPAIR STRABISMUS RIGHT EYE;  Surgeon: Tobie Factor, MD;  Location: [facility] OR;  Service: Ophthalmology;  Laterality: Right;   TONSILLECTOMY     TRANSPHENOIDAL / TRANSNASAL HYPOPHYSECTOMY / RESECTION PITUITARY TUMOR  1998, 2010   x2 -   WISDOM TOOTH EXTRACTION      reports that he has never smoked. He has never used smokeless tobacco. He reports that he does not drink alcohol and does not use drugs. Allergies  Allergen Reactions   Peanut-Containing Drug Products Anaphylaxis   Penicillins Other (See Comments)    Did it involve swelling of the face/tongue/throat, SOB, or low BP? Yes Did it involve sudden or severe rash/hives, skin peeling, or any reaction on the inside of your mouth or nose? Yes Did you need to seek medical attention at  a hospital or doctor's office? Yes When did it last happen?     2010  If all above answers are "NO", may proceed with cephalosporin use.    Rosuvastatin Calcium      Other Reaction(s): leg pains   No family history on file. Prior to Admission medications   Medication Sig Start Date End Date Taking? Authorizing Provider  acetaminophen  (TYLENOL ) 325 MG tablet Take 2 tablets (650 mg total) by mouth every 6 (six) hours as needed for mild pain (or Fever >/= 101). 09/10/22   [name], Brayton RAMAN, MD  albuterol  (VENTOLIN  HFA) 108 (90 Base) MCG/ACT inhaler Inhale 2 puffs into the lungs every 6 (six) hours as needed for wheezing or shortness of  breath. 01/21/23   Dennise Lavada POUR, MD  aspirin  EC 81 MG tablet Take 81 mg by mouth daily.    [provider]  B Complex-C (B-COMPLEX WITH VITAMIN C) tablet Take 1 tablet by mouth daily.    [provider]  brimonidine  (ALPHAGAN ) 0.15 % ophthalmic solution Place 1 drop into the right eye 2 (two) times daily.    [provider]  Cholecalciferol  (VITAMIN D ) 50 MCG (2000 UT) CAPS Take 2,000 Units by mouth daily.    [provider]  clopidogrel  (PLAVIX ) 75 MG tablet Take 75 mg by mouth daily. 03/29/21   [provider]  COMBIGAN  0.2-0.5 % ophthalmic solution Place 1 drop into the left eye 2 (two) times daily. 08/20/22   [provider]  desmopressin  (DDAVP ) 0.1 MG tablet Take 1 tablet (0.1 mg total) by mouth 2 (two) times daily. 09/10/22   [name], Brayton RAMAN, MD  hydrocortisone  (CORTEF ) 10 MG tablet Take  2 tablets (20mg ) by mouth in the morning, and 1 tablet (10mg ) in the evening. 01/27/23   Singh, Prashant K, MD  latanoprost  (XALATAN ) 0.005 % ophthalmic solution Place 1 drop into both eyes at bedtime.    [provider]  levothyroxine  (SYNTHROID ) 100 MCG tablet Take 1 tablet (100 mcg total) by mouth daily before breakfast. 01/22/23   Singh, Prashant K, MD  Omega-3 Fatty Acids (FISH OIL) 1000 MG CAPS Take 1,000 mg by mouth daily.    [provider]  testosterone  cypionate (DEPOTESTOSTERONE CYPIONATE) 200 MG/ML injection Inject 0.7 mLs into the muscle every 14 (fourteen) days. 10/31/20   [provider]  Turmeric (QC TUMERIC COMPLEX PO) Take 1 capsule by mouth daily.    [provider]                                                                                 Vitals:   05/13/24 1430 05/13/24 1500 05/13/24 1715 05/13/24 1953  BP: 129/86  (!) 158/84 (!) 138/93  Pulse:  95 91 99  Resp: 17 (!) 22 12 20   Temp:    98.5 F (36.9 C)  TempSrc:    Oral  SpO2:  95% 97% 99%  Weight:      Height:       Physical Exam   Labs on Admission: I have personally reviewed following labs and imaging studies CBC: Recent Labs  Lab 05/13/24 1053  WBC 9.4  NEUTROABS 7.3  HGB 19.9*  HCT  57.4*  MCV 81.9  PLT 283   Basic Metabolic Panel: Recent Labs  Lab 05/13/24 1215  NA 135  K 4.2  CL 101  CO2 20*  GLUCOSE 68*  BUN 17  CREATININE 1.31*  CALCIUM  8.2*   GFR: Estimated Creatinine Clearance: 37.9 mL/min (A) (by C-G formula based on SCr of 1.31 mg/dL (H)). Liver Function Tests: Recent Labs  Lab 05/13/24 1215  AST 36  ALT 31  ALKPHOS 47  BILITOT 0.7  PROT 5.7*  ALBUMIN 3.2*   Recent Labs  Lab 05/13/24 1215  LIPASE 29   No results for input(s): AMMONIA in the last 168 hours. Coagulation Profile: No results for input(s): INR, PROTIME in the last 168 hours. Cardiac Enzymes: No results for input(s): CKTOTAL, CKMB, CKMBINDEX, TROPONINI in the last 168 hours. BNP (last 3 results) No results for input(s): PROBNP in the last 8760 hours. HbA1C: No results for input(s): HGBA1C in the last 72 hours. CBG: Recent Labs  Lab 05/13/24 1951  GLUCAP 107*   Lipid Profile: No results for input(s): CHOL, HDL, LDLCALC, TRIG, CHOLHDL, LDLDIRECT in the last 72 hours. Thyroid  Function Tests: Recent Labs    05/13/24 1708  TSH 0.036*  FREET4 0.91   Anemia Panel: No results for input(s): VITAMINB12, FOLATE, FERRITIN, TIBC, IRON, RETICCTPCT in the last 72 hours. Urine analysis:    Component Value Date/Time   COLORURINE STRAW (A) 09/17/2023 1311   APPEARANCEUR CLEAR 09/17/2023 1311   LABSPEC 1.003 (L) 09/17/2023 1311   PHURINE 7.0 09/17/2023 1311   GLUCOSEU NEGATIVE 09/17/2023 1311   HGBUR NEGATIVE 09/17/2023 1311   BILIRUBINUR NEGATIVE 09/17/2023 1311   KETONESUR NEGATIVE 09/17/2023 1311   PROTEINUR NEGATIVE 09/17/2023 1311   UROBILINOGEN 0.2 05/17/2009 0253   NITRITE NEGATIVE 09/17/2023 1311   LEUKOCYTESUR NEGATIVE 09/17/2023 1311   Radiological Exams  on Admission: CT ABDOMEN PELVIS W CONTRAST Result Date: 05/13/2024 CLINICAL DATA:  Abdominal pain. EXAM: CT ABDOMEN AND PELVIS WITH CONTRAST TECHNIQUE: Multidetector CT imaging of the abdomen and pelvis was performed using the standard protocol following bolus administration of intravenous contrast. RADIATION DOSE REDUCTION: This exam was performed according to the departmental dose-optimization program which includes automated exposure control, adjustment of the mA and/or kV according to patient size and/or use of iterative reconstruction technique. CONTRAST:  75mL OMNIPAQUE IOHEXOL 350 MG/ML SOLN COMPARISON:  CT dated 02/13/2020. FINDINGS: Lower chest: The visualized lung bases are clear. No intra-abdominal free air or free fluid. Hepatobiliary: The liver is unremarkable. No dilatation. Multiple gallstones. No pericholecystic fluid or evidence of acute cholecystitis by CT. Pancreas: Unremarkable. No pancreatic ductal dilatation or surrounding inflammatory changes. Spleen: Normal in size without focal abnormality. Adrenals/Urinary Tract: The adrenal glands are unremarkable. There is no hydronephrosis on either side. There is symmetric enhancement and excretion of contrast by both kidneys. The visualized ureters and urinary bladder appear unremarkable. Stomach/Bowel: There is loose stool throughout the colon consistent with diarrheal state. There is no bowel obstruction or active inflammation. The appendix is normal. Vascular/Lymphatic: Mild aortoiliac atherosclerotic disease. The IVC is unremarkable. No portal venous gas. There is no adenopathy. Improved nonspecific upper mesentery stranding. Reproductive: Prostatectomy. Other: Midline vertical anterior pelvic wall incisional scar. Musculoskeletal: Osteopenia with degenerative changes. No acute osseous pathology. IMPRESSION: 1. Diarrheal state. Mesh in correlation with clinical exam and stool cultures recommended. No bowel obstruction. Normal appendix. 2.  Cholelithiasis. 3.  Aortic Atherosclerosis (ICD10-I70.0). Electronically Signed   By: Vanetta Chou M.D.   On: 05/13/2024 14:54   Data Reviewed:  Relevant notes from primary care and specialist visits, past discharge summaries as available in EHR, including Care Everywhere . Prior diagnostic testing as pertinent to current admission diagnoses, Updated medications and problem lists for reconciliation .ED course, including vitals, labs, imaging, treatment and response to treatment,Triage notes, nursing and pharmacy notes and ED provider's notes.Notable results as noted in HPI.Discussed case with EDMD/ ED APP/ or Specialty MD on call and as needed.  Assessment & Plan  >> Nausea vomiting diarrhea: Differentials include viral or bacterial gastroenteritis, also could be related to his thyroid  or hormonal insufficiency or adrenal insufficiency.  Supportive care with IV fluids antiemetics IV PPI aspiration precaution. Stool studies for C. difficile, and gastro panel. Cont rocephin  and flagyl.  Troponin pending.  Dimer is pending - if high will defer to AM team to obtain CTA chest pe protocol.   >> Abdominal pain: Most likely from acidosis or from illness from gastroenteritis. Clear liquid diet as tolerated and supportive care. CT imaging is reassuring.  IV PPI   >>Lactic acidosis D/d include mild adrenal insufficiency.  Will give single dose of 100 mg hydrocortisone .  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 -Continue levothyroxine , DDAVP , hydrocortisone  - will defer to PCP to plan fro  hydrocortisone  injection pt is polycythemia and has risk of same. - Patient presentation is concerning for mild to moderate adrenal insufficiency suspect he may not have taken stress dose during his illness and therefore we will start patient on hydrocortisone  100 mg IV x 1 followed by his p.o. doses.    >>Chronic diastolic CHF: Last echocardiogram from 2018 noted EF to be 60 to 65% with grade 1 diastolic dysfunction at that time.  Patient appears to be dehydrated.   >>Polycythemia: Chronic.  Would recommend patient to discontinue his testosterone  injections as it puts him at risk for hypercoagulability hypertension.     >> AKI: Lab Results  Component Value Date   CREATININE 1.31 (H) 05/13/2024   CREATININE 1.13 09/17/2023   CREATININE 1.14 01/21/2023  Due to dehydration and prerenal causes suspected from his GI loss.    >>H/O basilar artery / carotid artery stenosis/ H/O TIA/MCA stenosis: Cont plavix .   >>Hypogonadism: D/c testosterone .  PSA check will add on if possible.   >>Glaucoma: Cont eye drops. Cont alphagan  combigan  and xalatan .     DVT prophylaxis:  Heparin Consults:  None Advance Care Planning:    Code Status: Full Code   Family Communication:  None none Disposition Plan:  Home Severity of Illness: The appropriate patient status for this patient is OBSERVATION. Observation status is judged to be reasonable and necessary in order to provide the required intensity of service to ensure the patient's safety. The patient's presenting symptoms, physical exam findings, and initial radiographic and laboratory data in the context of their medical condition is felt to place them at decreased risk for further clinical deterioration. Furthermore, it is anticipated that the patient will be medically stable for discharge from the hospital within 2 midnights of admission.   Unresulted Labs (From admission, onward)     Start     Ordered   05/14/24  0500  Comprehensive  metabolic panel  Tomorrow morning,   R        05/13/24 1645   05/14/24 0500  CBC  Tomorrow morning,   R        05/13/24 1645   05/14/24 0500  Magnesium  Tomorrow morning,   R        05/13/24 2017   05/13/24 2022  PSA  Add-on,   AD        05/13/24 2021   05/13/24 2018  Magnesium  Add-on,   AD        05/13/24 2017   05/13/24 2009  D-dimer, quantitative  Add-on,   AD        05/13/24 2008   05/13/24 2008  Gastrointestinal Panel by PCR , Stool  (Gastrointestinal Panel by PCR, Stool                                                                                                                                                     **Does Not include CLOSTRIDIUM DIFFICILE testing. **If CDIFF testing is needed, place order from the C Difficile Testing order set.**)  Once,   R        05/13/24 2007   05/13/24 1015  Blood culture (routine x 2)  BLOOD CULTURE X 2,   R      05/13/24 1014   05/13/24 0959  Urinalysis, Routine w reflex microscopic -Urine, Clean Catch  (ED Abdominal Pain)  Once,   URGENT       Question:  Specimen Source  Answer:  Urine, Clean Catch   05/13/24 0959   Unscheduled  Occult blood card to lab, stool  As needed,   R      05/13/24 2007           Meds ordered this encounter  Medications   lactated ringers  bolus 1,000 mL   ondansetron  (ZOFRAN ) injection 4 mg   lactated ringers  bolus 1,000 mL   cefTRIAXone  (ROCEPHIN ) 2 g in sodium chloride  0.9 % 100 mL IVPB    Antibiotic Indication::   Intra-abdominal   metroNIDAZOLE (FLAGYL) IVPB 500 mg    Antibiotic Indication::   Intra-abdominal Infection   DISCONTD: dextrose 5 % and 0.9% NaCl 5-0.9 % bolus 500 mL   iohexol (OMNIPAQUE) 350 MG/ML injection 75 mL   dextrose 50 % solution 50 mL   sodium chloride  flush (NS) 0.9 % injection 3 mL   OR Linked Order Group    acetaminophen  (TYLENOL ) tablet 650 mg    acetaminophen  (TYLENOL ) suppository 650 mg   morphine (PF) 2 MG/ML injection 2 mg   sodium chloride  flush (NS) 0.9 % injection  3-10 mL   sodium chloride  flush (NS) 0.9 % injection 3-10 mL   heparin injection 5,000 Units   hydrocortisone  sodium succinate  (SOLU-CORTEF ) 100 MG injection 100  mg    IV hydrocortisone  will be converted to either a q8h or q12h frequency with the same total daily dose (TDD).  Ordered Dose: 1 to 200 mg TDD; convert to: TDD div q12h.  Ordered Dose: 201 to 300 mg TDD; convert to: TDD div q8h.  Ordered Dose: >300 mg TDD; DAW.   pantoprazole (PROTONIX) injection 40 mg   0.9 %  sodium chloride  infusion   metroNIDAZOLE (FLAGYL) IVPB 500 mg    Antibiotic Indication::   Intra-abdominal Infection   aspirin  EC tablet 81 mg   desmopressin  (DDAVP ) tablet 0.1 mg   hydrocortisone  (CORTEF ) tablet 20 mg    Take  2 tablets (20mg ) by mouth in the morning, and 1 tablet (10mg ) in the evening.     DISCONTD: levothyroxine  (SYNTHROID ) tablet 100 mcg   clopidogrel  (PLAVIX ) tablet 75 mg   cholecalciferol  (VITAMIN D3) 25 MCG (1000 UNIT) tablet 2,000 Units   B-complex with vitamin C tablet 1 tablet   latanoprost  (XALATAN ) 0.005 % ophthalmic solution 1 drop   DISCONTD: brimonidine -timolol  (COMBIGAN ) 0.2-0.5 % ophthalmic solution 1 drop   brimonidine  (ALPHAGAN ) 0.15 % ophthalmic solution 1 drop   albuterol  (PROVENTIL ) (2.5 MG/3ML) 0.083% nebulizer solution 3 mL   hydrocortisone  (CORTEF ) tablet 10 mg   brimonidine  (ALPHAGAN ) 0.2 % ophthalmic solution 1 drop   timolol  (TIMOPTIC ) 0.5 % ophthalmic solution 1 drop   levothyroxine  (SYNTHROID ) tablet 112 mcg     Orders Placed This Encounter  Procedures   Blood culture (routine x 2)   C Difficile Quick Screen w PCR reflex   Resp panel by RT-PCR (RSV, Flu A&B, Covid) Anterior Nasal Swab   Gastrointestinal Panel by PCR , Stool   CT ABDOMEN PELVIS W CONTRAST   CBC with Differential   Urinalysis, Routine w reflex microscopic -Urine, Clean Catch   Comprehensive metabolic panel with GFR   Lipase, blood   Gamma GT   Ethanol   Comprehensive metabolic panel   CBC    T4, free   TSH   Cortisol   Glucose, capillary   Occult blood card to lab, stool   D-dimer, quantitative   Magnesium   Magnesium   PSA   Diet clear liquid Room service appropriate? Yes; Fluid consistency: Thin   Orthostatic vital signs   Maintain IV access   Vital signs   Notify physician (specify)   Mobility Protocol: No Restrictions RN to initiate protocols based on patient's level of care   Refer to Sidebar Report Refer to ICU, Med-Surg, Progressive, and Step-Down Mobility Protocol Sidebars   Initiate Adult Central Line Maintenance and Catheter Protocol for patients with central line (CVC, PICC, Port, Hemodialysis, Trialysis)   Daily weights   Intake and Output   Do not place and if present remove PureWick   Initiate Oral Care Protocol   Initiate Carrier Fluid Protocol   RN may order General Admission PRN Orders utilizing General Admission PRN medications (through manage orders) for the following patient needs: allergy symptoms (Claritin), cold sores (Carmex), cough (Robitussin DM), eye irritation (Liquifilm Tears), hemorrhoids (Tucks), indigestion (Maalox), minor skin irritation (Hydrocortisone  Cream), muscle pain (Ben Gay), nose irritation (saline nasal spray) and sore throat (Chloraseptic spray).   Cardiac Monitoring - Continuous Indefinite   Strict intake and output   Full code   Consult to hospitalist   Enteric precautions (UV disinfection)   Pulse oximetry check with vital signs   Oxygen  therapy Mode or (Route): Nasal cannula; Liters Per Minute: 2; Keep O2 saturation between: greater than  92 %   I-Stat CG4 Lactic Acid   I-Stat CG4 Lactic Acid   Insert peripheral IV   Place in observation (patient's expected length of stay will be less than 2 midnights)    Author: Mario LULLA Blanch, MD 12 pm- 8 pm. [facility]. 05/13/2024 8:23 PM Please note for any communication after hours contact [facility] Assigned provider on call on Amion.

## 2024-05-13 NOTE — ED Notes (Signed)
 Brief changed and fresh bed linens.

## 2024-05-13 NOTE — ED Triage Notes (Signed)
 Pt arrived via [facility] from Home for N/V/D x4 days. Pt lowered himself to the floor in bathroom last night, no fall, did not hit head. Family called when found on floor. 95 O2 RA 84 HR SBP 100s en route. Endorses chronic dehydration and poor PO intake. CBG 83. Pt reports hx of adrenal insufficiency.

## 2024-05-13 NOTE — ED Notes (Signed)
 Awaiting verification for 1700 meds

## 2024-05-14 DIAGNOSIS — R112 Nausea with vomiting, unspecified: Secondary | ICD-10-CM | POA: Diagnosis present

## 2024-05-14 DIAGNOSIS — Z1152 Encounter for screening for COVID-19: Secondary | ICD-10-CM | POA: Diagnosis not present

## 2024-05-14 DIAGNOSIS — R197 Diarrhea, unspecified: Secondary | ICD-10-CM | POA: Diagnosis not present

## 2024-05-14 DIAGNOSIS — Z91010 Allergy to peanuts: Secondary | ICD-10-CM | POA: Diagnosis not present

## 2024-05-14 DIAGNOSIS — E663 Overweight: Secondary | ICD-10-CM | POA: Diagnosis present

## 2024-05-14 DIAGNOSIS — Z79899 Other long term (current) drug therapy: Secondary | ICD-10-CM | POA: Diagnosis not present

## 2024-05-14 DIAGNOSIS — I2489 Other forms of acute ischemic heart disease: Secondary | ICD-10-CM | POA: Diagnosis present

## 2024-05-14 DIAGNOSIS — I5032 Chronic diastolic (congestive) heart failure: Secondary | ICD-10-CM | POA: Diagnosis present

## 2024-05-14 DIAGNOSIS — Z79890 Hormone replacement therapy (postmenopausal): Secondary | ICD-10-CM | POA: Diagnosis not present

## 2024-05-14 DIAGNOSIS — E2740 Unspecified adrenocortical insufficiency: Secondary | ICD-10-CM | POA: Diagnosis present

## 2024-05-14 DIAGNOSIS — E785 Hyperlipidemia, unspecified: Secondary | ICD-10-CM | POA: Diagnosis present

## 2024-05-14 DIAGNOSIS — N179 Acute kidney failure, unspecified: Secondary | ICD-10-CM | POA: Diagnosis present

## 2024-05-14 DIAGNOSIS — Z6826 Body mass index (BMI) 26.0-26.9, adult: Secondary | ICD-10-CM | POA: Diagnosis not present

## 2024-05-14 DIAGNOSIS — E039 Hypothyroidism, unspecified: Secondary | ICD-10-CM | POA: Diagnosis present

## 2024-05-14 DIAGNOSIS — E876 Hypokalemia: Secondary | ICD-10-CM | POA: Diagnosis present

## 2024-05-14 DIAGNOSIS — Z880 Allergy status to penicillin: Secondary | ICD-10-CM | POA: Diagnosis not present

## 2024-05-14 DIAGNOSIS — D751 Secondary polycythemia: Secondary | ICD-10-CM | POA: Diagnosis present

## 2024-05-14 DIAGNOSIS — A0811 Acute gastroenteropathy due to Norwalk agent: Secondary | ICD-10-CM | POA: Diagnosis present

## 2024-05-14 DIAGNOSIS — E872 Acidosis, unspecified: Secondary | ICD-10-CM | POA: Diagnosis present

## 2024-05-14 DIAGNOSIS — H409 Unspecified glaucoma: Secondary | ICD-10-CM | POA: Diagnosis present

## 2024-05-14 DIAGNOSIS — E871 Hypo-osmolality and hyponatremia: Secondary | ICD-10-CM | POA: Diagnosis present

## 2024-05-14 DIAGNOSIS — Z888 Allergy status to other drugs, medicaments and biological substances status: Secondary | ICD-10-CM | POA: Diagnosis not present

## 2024-05-14 DIAGNOSIS — Z8673 Personal history of transient ischemic attack (TIA), and cerebral infarction without residual deficits: Secondary | ICD-10-CM | POA: Diagnosis not present

## 2024-05-14 DIAGNOSIS — E271 Primary adrenocortical insufficiency: Secondary | ICD-10-CM | POA: Diagnosis not present

## 2024-05-14 DIAGNOSIS — E860 Dehydration: Secondary | ICD-10-CM | POA: Diagnosis present

## 2024-05-14 DIAGNOSIS — E8809 Other disorders of plasma-protein metabolism, not elsewhere classified: Secondary | ICD-10-CM | POA: Diagnosis present

## 2024-05-14 LAB — GASTROINTESTINAL PANEL BY PCR, STOOL (REPLACES STOOL CULTURE)

## 2024-05-14 LAB — COMPREHENSIVE METABOLIC PANEL WITH GFR
ALT: 31 U/L (ref 0–44)
AST: 41 U/L (ref 15–41)
Albumin: 3.3 g/dL — ABNORMAL LOW (ref 3.5–5.0)
Alkaline Phosphatase: 51 U/L (ref 38–126)
Anion gap: 11 (ref 5–15)
BUN: 19 mg/dL (ref 8–23)
CO2: 17 mmol/L — ABNORMAL LOW (ref 22–32)
Calcium: 8 mg/dL — ABNORMAL LOW (ref 8.9–10.3)
Chloride: 105 mmol/L (ref 98–111)
Creatinine, Ser: 1.5 mg/dL — ABNORMAL HIGH (ref 0.61–1.24)
GFR, Estimated: 44 mL/min — ABNORMAL LOW (ref >60)
Glucose, Bld: 117 mg/dL — ABNORMAL HIGH (ref 70–99)
Potassium: 4 mmol/L (ref 3.5–5.1)
Sodium: 133 mmol/L — ABNORMAL LOW (ref 135–145)
Total Bilirubin: 0.9 mg/dL (ref 0.0–1.2)
Total Protein: 6.1 g/dL — ABNORMAL LOW (ref 6.5–8.1)

## 2024-05-14 LAB — CBC
HCT: 53.6 % — ABNORMAL HIGH (ref 39.0–52.0)
Hemoglobin: 18.4 g/dL — ABNORMAL HIGH (ref 13.0–17.0)
MCH: 28.3 pg (ref 26.0–34.0)
MCHC: 34.3 g/dL (ref 30.0–36.0)
MCV: 82.5 fL (ref 80.0–100.0)
Platelets: 223 10*3/uL (ref 150–400)
RBC: 6.5 MIL/uL — ABNORMAL HIGH (ref 4.22–5.81)
RDW: 14.5 % (ref 11.5–15.5)
WBC: 7.5 10*3/uL (ref 4.0–10.5)
nRBC: 0 % (ref 0.0–0.2)

## 2024-05-14 LAB — GLUCOSE, CAPILLARY
Glucose-Capillary: 118 mg/dL — ABNORMAL HIGH (ref 70–99)
Glucose-Capillary: 121 mg/dL — ABNORMAL HIGH (ref 70–99)
Glucose-Capillary: 132 mg/dL — ABNORMAL HIGH (ref 70–99)
Glucose-Capillary: 128 mg/dL — ABNORMAL HIGH (ref 70–99)
Glucose-Capillary: 117 mg/dL — ABNORMAL HIGH (ref 70–99)

## 2024-05-14 LAB — LACTIC ACID, PLASMA
Lactic Acid, Venous: 2.2 mmol/L (ref 0.5–1.9)
Lactic Acid, Venous: 2.3 mmol/L (ref 0.5–1.9)
Lactic Acid, Venous: 1.6 mmol/L (ref 0.5–1.9)

## 2024-05-14 LAB — TROPONIN I (HIGH SENSITIVITY)
Troponin I (High Sensitivity): 112 ng/L (ref <18)
Troponin I (High Sensitivity): 75 ng/L — ABNORMAL HIGH (ref <18)

## 2024-05-14 LAB — D-DIMER, QUANTITATIVE: D-Dimer, Quant: 1.57 ug{FEU}/mL — ABNORMAL HIGH (ref 0.00–0.50)

## 2024-05-14 LAB — MAGNESIUM
Magnesium: 1.8 mg/dL (ref 1.7–2.4)
Magnesium: 1.8 mg/dL (ref 1.7–2.4)

## 2024-05-14 MED ORDER — SODIUM CHLORIDE 0.9 % IV SOLN: INTRAVENOUS | Status: AC

## 2024-05-14 MED ORDER — ORAL CARE MOUTH RINSE: 15.0000 mL | OROMUCOSAL | Status: DC | PRN

## 2024-05-14 MED ORDER — MELATONIN 5 MG PO TABS
5.0000 mg | ORAL_TABLET | Freq: Every evening | ORAL | Status: DC | PRN
  Administered 2024-05-14 – 2024-05-16 (×2): 5 mg via ORAL
  Filled 2024-05-14 (×2): qty 1

## 2024-05-14 MED ORDER — LACTATED RINGERS IV BOLUS
1000.0000 mL | Freq: Once | INTRAVENOUS | Status: AC
  Administered 2024-05-14: 1000 mL via INTRAVENOUS

## 2024-05-14 NOTE — TOC CM/SW Note (Signed)
 Transition of Care [facility]) - Inpatient Brief Assessment   Patient Details  Name: Alexander Yoder ON MRN: [id number] Date of Birth: January 12, 1930  [facility] CM/SW Contact:    Tom-Johnson, Prudy Candy Daphne, RN Phone Number: 05/14/2024, 4:03 PM   Clinical Narrative:  Patient presented to the ED with N/V/D with Abdominal Discomfort. Patient too weak and lowered self to the bathroom floor at home. Family found him and called EMS. Patient states he had eaten tainted Tomato, Mayonnaise and Bread. Currently on IV abx.  Has hx of Adrenal Insufficiency, Pituitary Tumor with Panhypothyroidism, Prostate Cancer, Diabetes Insipidus and HLD.     From home with wife, has two supportive children. Retired, modified independent, has a cane and walker at home.  PCP is Charlott Dorn LABOR, MD and uses [facility] on [address]   No TOC needs or recommendations noted at this time.  Patient not Medically ready for discharge.  CM will continue to follow as patient progresses with care towards discharge.                  Transition of Care Asessment: Insurance and Status: Insurance coverage has been reviewed Patient has primary care physician: Yes Home environment has been reviewed: Yes Prior level of function:: Modified Independent Prior/Current Home Services: No current home services Social Drivers of Health Review: SDOH reviewed no interventions necessary Readmission risk has been reviewed: Yes Transition of care needs: no transition of care needs at this time

## 2024-05-14 NOTE — Hospital Course (Addendum)
 Alexander Yoder is a 89 y.o. 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  There is concern that he may have had some adrenal insufficiency so he was given a dose of hydrocortisone  100 mg.  Further workup reveals that he has norovirus and symptoms were improving yesterday however he was nauseous and felt a little worse today so we will continue IV fluid hydration for another 12 hours and give him another dose of IV hydrocortisone  100 mg x 1.  Will continue supportive care and replete electrolyte abnormalities.  He improved significantly and was deemed medically stable for discharge and will need to follow-up with PCP and endocrinology in outpatient setting.  Assessment and Plan:  N/V/Diarrhea: In the setting of Norovirus. Will D/C Abx. D-Dimer and CRP likely elevated in the setting of Infection from Noroviurs. Repeat in the AM. C/w Supprotive Care and IV fluids to be reduced to 75 mL/h for another 12 more hours.  Continue with antiemetics given the patient was nauseous today but has not today.  He is improved and significantly improved by the time of discharge.  Abdominal discomfort likely in setting of norovirus gastroenteritis.  Will go to clear liquid diet to full liquid diet to SOFT Diet.  Tolerating soft diet without issues and is stable for discharge  Lactic Acidosis: Lactic Acid Trend: Recent Labs  Lab 05/13/24 1103 05/13/24 1222 05/13/24 1717 05/14/24 1010 05/14/24 1300 05/14/24 2114 05/15/24 0016  LATICACIDVEN 2.5* 6.8* 2.1* 2.2* 2.3* 1.6 1.1  -Bolused yesterday afternoon and getting mIVF as above. CTM and Trend and repeaing LA and this is now improved.  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 -Continue levothyroxine , DDAVP , hydrocortisone  -Patient's presentation was concerning for mild to moderate adrenal insufficiency suspect he may not have taken stress dose during his illness and therefore we will start patient on Hydrocortisone  100 mg IV x 1 followed by his p.o. doses.  Will give him another 100 mg hydrocortisone  injection today given his nausea vomiting and drop in sodium and low potassium -TSH was 0.036 and free T4 was 0.91   Chronic Diastolic CHF: Last echocardiogram from 2018 noted EF to be 60 to 65% with grade 1 diastolic dysfunction at that time.  Patient appears to be dehydrated so will c/w IVF as above for another 12 hours.  Strict I's and O's and daily weights and will need to continue to monitor for signs and symptoms of volume overload  Hyponatremia/ Hypokalemia / Hypophosphatemia: Repelte w/ IV NaPhos 30 mmol and po KCL 40 mEQ BID. CTM and Replete Electrolytes as Necessary. Repeat CMP, Mag, Phos in the AM.     Polycythemia:  Chronic. Would recommend patient to discontinue his testosterone  injections as it puts him at risk for hypercoagulability. Hgb/Hct Trend:  Recent Labs  Lab 05/13/24 1053 05/14/24 0541 05/15/24 0641 05/16/24 0723  HGB 19.9* 18.4* 16.6 17.1*  HCT 57.4* 53.6* 48.2 49.9  MCV 81.9 82.5 83.1 81.9  -CTM and Trend and repeat CMP within 1 week    AKI / Metabolic Acidosis: In setting of dehydration and prerenal causes from his GI losses BUN/Cr Trend: Recent  Labs  Lab 05/13/24 1215 05/14/24 0541 05/15/24 0641 05/16/24 0723  BUN 17 19 10 8   CREATININE 1.31* 1.50* 0.97 0.81  -IVF as above.  Has slight metabolic acidosis with a CO2 of 19, anion gap of 10, chloride level 100 -Avoid Nephrotoxic Medications, Contrast Dyes, Hypotension and Dehydration to Ensure Adequate Renal  Perfusion and will need to Renally Adjust Meds. CTM and Trend Renal Function carefully and repeat within 1 week  Hypophosphatemia: Replete with IV sodium Phos 30 mmol prior to discharge.  To monitor replete and repeat CMP within 1 week  Evaded Troponin: Likely demand ischemia in the setting of dehydration from his norovirus  H/O basilar artery / carotid artery stenosis/ H/O TIA/MCA stenosis: C/w Clopidogrel .    Hypogonadism: D/c Testosterone  for now.  PSA 0.27   Glaucoma: Cont eye drops. Cont alphagan  combigan  and xalatan .   Hypoalbuminemia: Patient's Albumin Level went from 3.2 -> 3.3 -> 3.1. CTM and Trend and repeat CMP in the AM  Overweight: Complicates overall prognosis and care. Estimated body mass index is 26.67 kg/m as calculated from the following:   Height as of this encounter: 5' 9.5 (1.765 m).   Weight as of this encounter: 83.1 kg. Weight Loss and Dietary Counseling given

## 2024-05-14 NOTE — Progress Notes (Signed)
 PROGRESS NOTE    Alexander Yoder  FMW:[id number] DOB: [birth year] DOA: 05/13/2024 PCP: Charlott Dorn LABOR, MD   Brief Narrative:  Alexander Yoder is a 89 y.o. 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  There is concern that he may have had some adrenal insufficiency so he was given a dose of hydrocortisone  100 mg.  Further workup reveals that he has norovirus and his symptoms are improving.  Assessment and Plan:  N/V/Diarrhea: In the setting of Norovirus. Will D/C Abx. D-Dimer and CRP likely elevated in the setting of Infection from Noroviurs. Repeat in the AM. C/w Supprotive Care and IV fluids to be reduced to 75 mL/h for another 12 more hours.  Continue with antiemetics  Abdominal discomfort likely in setting of norovirus gastroenteritis.  Will go to clear liquid diet to full liquid diet to SOFT Diet.  Continue to monitor  Lactic Acidosis: Lactic Acid Trend: Recent Labs  Lab 05/13/24 1103 05/13/24 1222 05/13/24 1717 05/14/24 1010 05/14/24 1300  LATICACIDVEN 2.5* 6.8* 2.1* 2.2* 2.3*  -Bolused this Afternoon and getting mIVF as above. 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 -Continue levothyroxine , DDAVP , hydrocortisone  -Patient's presentation was concerning for mild to moderate adrenal insufficiency suspect he may not have taken stress dose during his illness and therefore we will start patient on hydrocortisone  100 mg IV x 1 followed by his p.o. doses. -TSH was 0.036 and free T4  was 0.91   Chronic Diastolic CHF: Last echocardiogram from 2018 noted EF to be 60 to 65% with grade 1 diastolic dysfunction at that time.  Patient appears to be dehydrated so will c/w IVF as above.  Strict I's and O's and daily weights and will need to continue to monitor for signs and symptoms of volume overload    Polycythemia:  Chronic. Would recommend patient to discontinue his testosterone  injections as it puts him at risk for hypercoagulability. Hgb/Hct Trend:  Recent Labs  Lab 05/13/24 1053 05/14/24 0541  HGB 19.9* 18.4*  HCT 57.4* 53.6*  MCV 81.9 82.5  -CTM and Trend and repeat CMP in the AM    AKI / Metabolic Acidosis: In setting of dehydration and prerenal causes from his GI losses BUN/Cr Trend: Recent Labs  Lab 05/13/24 1215 05/14/24 0541  BUN 17 19  CREATININE 1.31* 1.50*  -IVF as above.  Is a slight metabolic acidosis with a CO2 of 17, anion gap of 11, chloride level 105 -Avoid Nephrotoxic Medications, Contrast Dyes, Hypotension and Dehydration to Ensure Adequate Renal Perfusion and will need to Renally Adjust Meds. CTM and Trend Renal Function carefully and repeat CMP in the AM   Evaded Troponin: Likely demand ischemia in the setting of dehydration from his norovirus  H/O basilar artery / carotid artery stenosis/ H/O TIA/MCA stenosis: C/w Clopidogrel .    Hypogonadism: D/c testosterone .  PSA 0.27   Glaucoma: Cont eye drops. Cont alphagan  combigan  and xalatan .   Hypoalbuminemia: Patient's Albumin Level went from 3.2 -> 3.3. CTM and Trend and repeat CMP in the AM   DVT prophylaxis: heparin injection 5,000 Units Start: 05/13/24  1700    Code Status: Full Code Family Communication: No family currently at bedside  Disposition Plan:  Level of care: Telemetry Medical Status is: Inpatient Remains inpatient appropriate because: His further clinical improvement in his symptoms  Consultants:  None  Procedures:  As delineated as above  Antimicrobials:   Anti-infectives (From admission, onward)    Start     Dose/Rate Route Frequency Ordered Stop   05/13/24 2200  metroNIDAZOLE (FLAGYL) IVPB 500 mg  Status:  Discontinued        500 mg 100 mL/hr over 60 Minutes Intravenous Every 12 hours 05/13/24 1647 05/14/24 2014   05/13/24 1345  cefTRIAXone  (ROCEPHIN ) 2 g in sodium chloride  0.9 % 100 mL IVPB  Status:  Discontinued        2 g 200 mL/hr over 30 Minutes Intravenous Every 24 hours 05/13/24 1344 05/14/24 2014   05/13/24 1345  metroNIDAZOLE (FLAGYL) IVPB 500 mg        500 mg 100 mL/hr over 60 Minutes Intravenous  Once 05/13/24 1344 05/13/24 1531       Subjective: Seen and examined at bedside and patient states that he is feeling better today.  Getting closer to his baseline.  Hoping to eat something more substantial than clears today.  No nausea or vomiting.  Denies any lightheadedness or dizziness.  Still having diarrhea though  Objective: Vitals:   05/14/24 0429 05/14/24 0733 05/14/24 1659 05/14/24 2010  BP: 109/67 129/81 138/82 (!) 158/87  Pulse: 79 80 86 84  Resp: 15 19 19 20   Temp: 98.4 F (36.9 C) 98.4 F (36.9 C) 98.1 F (36.7 C) 98.4 F (36.9 C)  TempSrc: Oral   Oral  SpO2: 98% 95% 97% 96%  Weight:      Height:        Intake/Output Summary (Last 24 hours) at 05/14/2024 2029 Last data filed at 05/14/2024 1830 Gross per 24 hour  Intake 2785.78 ml  Output 1100 ml  Net 1685.78 ml   Filed Weights   05/13/24 0958 05/13/24 2030  Weight: 79.8 kg 80.9 kg   Examination: Physical Exam:  Constitutional: WN/WD elderly slightly overweight Caucasian male in no acute distress Respiratory: Diminished to auscultation bilaterally with some coarse breath sounds, no wheezing, rales, rhonchi or crackles. Normal respiratory effort and patient is not tachypenic. No accessory muscle use.  Unlabored breathing Cardiovascular: RRR, no murmurs / rubs / gallops. S1 and S2 auscultated. No extremity edema. Abdomen: Soft, very mildly-tender  slightly distended secondary body habitus bowel sounds positive.  GU: Deferred.  Flexi-Seal in place Musculoskeletal: No clubbing / cyanosis of digits/nails. No joint deformity upper and lower extremities.  Skin: No rashes, lesions, ulcers. No induration; Warm and dry.  Neurologic: CN 2-12 grossly intact with no focal deficits. Romberg sign and cerebellar reflexes not assessed.  Psychiatric: Normal judgment and insight. Alert and oriented x 3. Normal mood and appropriate affect.   Data Reviewed: I have personally reviewed following labs and imaging studies  CBC: Recent Labs  Lab 05/13/24 1053 05/14/24 0541  WBC 9.4 7.5  NEUTROABS 7.3  --   HGB 19.9* 18.4*  HCT 57.4* 53.6*  MCV 81.9 82.5  PLT 283 223   Basic Metabolic Panel: Recent Labs  Lab 05/13/24 1215 05/13/24 2110 05/14/24 0541  NA 135  --  133*  K 4.2  --  4.0  CL 101  --  105  CO2 20*  --  17*  GLUCOSE 68*  --  117*  BUN 17  --  19  CREATININE 1.31*  --  1.50*  CALCIUM  8.2*  --  8.0*  MG  --  1.8 1.8   GFR: Estimated Creatinine Clearance: 32.6 mL/min (A) (by C-G formula based on SCr of 1.5 mg/dL (H)). Liver Function Tests: Recent Labs  Lab 05/13/24 1215 05/14/24 0541  AST 36 41  ALT 31 31  ALKPHOS 47 51  BILITOT 0.7 0.9  PROT 5.7* 6.1*  ALBUMIN 3.2* 3.3*   Recent Labs  Lab 05/13/24 1215  LIPASE 29   No results for input(s): AMMONIA in the last 168 hours. Coagulation Profile: No results for input(s): INR, PROTIME in the last 168 hours. Cardiac Enzymes: No results for input(s): CKTOTAL, CKMB, CKMBINDEX, TROPONINI in the last 168 hours. BNP (last 3 results) No results for input(s): PROBNP in the last 8760 hours. HbA1C: No results for input(s): HGBA1C in the last 72 hours. CBG: Recent Labs  Lab 05/14/24 0427 05/14/24 0736 05/14/24 1132 05/14/24 1702 05/14/24 2007  GLUCAP 118* 121* 132* 128* 117*   Lipid Profile: No results for input(s): CHOL, HDL, LDLCALC, TRIG,  CHOLHDL, LDLDIRECT in the last 72 hours. Thyroid  Function Tests: Recent Labs    05/13/24 1708  TSH 0.036*  FREET4 0.91   Anemia Panel: No results for input(s): VITAMINB12, FOLATE, FERRITIN, TIBC, IRON, RETICCTPCT in the last 72 hours. Sepsis Labs: Recent Labs  Lab 05/13/24 1222 05/13/24 1717 05/14/24 1010 05/14/24 1300  LATICACIDVEN 6.8* 2.1* 2.2* 2.3*   Recent Results (from the past 240 hours)  Blood culture (routine x 2)     Status: None (Preliminary result)   Collection Time: 05/13/24 10:15 AM   Specimen: BLOOD  Result Value Ref Range Status   Specimen Description BLOOD SITE NOT SPECIFIED  Final   Special Requests   Final    BOTTLES DRAWN AEROBIC ONLY Blood Culture results may not be optimal due to an inadequate volume of blood received in culture bottles   Culture   Final    NO GROWTH < 24 HOURS Performed at [facility], [address], [place], [place] [address]    Report Status PENDING  Incomplete  C Difficile Quick Screen w PCR reflex     Status: None   Collection Time: 05/13/24 10:15 AM   Specimen: STOOL  Result Value Ref Range Status   C Diff antigen NEGATIVE NEGATIVE Final   C Diff toxin NEGATIVE NEGATIVE Final   C Diff interpretation No C. difficile detected.  Final    Comment: Performed at [facility], [address], [place], [place] [address]  Resp panel by RT-PCR (RSV, Flu A&B, Covid) Anterior Nasal Swab     Status: None   Collection Time: 05/13/24 10:15 AM   Specimen: Anterior Nasal Swab  Result Value Ref Range Status   SARS Coronavirus 2 by RT PCR NEGATIVE NEGATIVE Final   Influenza A by PCR NEGATIVE NEGATIVE Final   Influenza B by PCR NEGATIVE NEGATIVE Final    Comment: (NOTE) The Xpert Xpress SARS-CoV-2/FLU/RSV plus assay is intended as an aid in the diagnosis of influenza from Nasopharyngeal swab specimens and should not be used as a sole basis for treatment. Nasal washings and aspirates are unacceptable for Xpert  Xpress SARS-CoV-2/FLU/RSV testing.  Fact Sheet for Patients: BloggerCourse.com  Fact Sheet for Healthcare Providers: SeriousBroker.it  This test is not yet approved or cleared by the [place]  FDA and has been authorized for detection and/or diagnosis of SARS-CoV-2 by FDA under an Emergency Use Authorization (EUA).  This EUA will remain in effect (meaning this test can be used) for the duration of the COVID-19 declaration under Section 564(b)(1) of the Act, 21 U.S.C. section 360bbb-3(b)(1), unless the authorization is terminated or revoked.     Resp Syncytial Virus by PCR NEGATIVE NEGATIVE Final    Comment: (NOTE) Fact Sheet for Patients: BloggerCourse.com  Fact Sheet for Healthcare Providers: SeriousBroker.it  This test is not yet approved or cleared by the [place]  FDA and has been authorized for detection and/or diagnosis of SARS-CoV-2 by FDA under an Emergency Use Authorization (EUA). This EUA will remain in effect (meaning this test can be used) for the duration of the COVID-19 declaration under Section 564(b)(1) of the Act, 21 U.S.C. section 360bbb-3(b)(1), unless the authorization is terminated or revoked.  Performed at [facility], [address], [place], [place] [address]   Blood culture (routine x 2)     Status: None (Preliminary result)   Collection Time: 05/13/24  5:05 PM   Specimen: BLOOD RIGHT HAND  Result Value Ref Range Status   Specimen Description BLOOD RIGHT HAND  Final   Special Requests   Final    BOTTLES DRAWN AEROBIC ONLY Blood Culture results may not be optimal due to an inadequate volume of blood received in culture bottles   Culture   Final    NO GROWTH < 24 HOURS Performed at [facility], [address] [place], [place] [address]    Report Status PENDING  Incomplete  Gastrointestinal Panel by PCR , Stool     Status:  Abnormal   Collection Time: 05/14/24  9:56 AM   Specimen: Stool  Result Value Ref Range Status   Campylobacter species NOT DETECTED NOT DETECTED Final   Plesimonas shigelloides NOT DETECTED NOT DETECTED Final   Salmonella species NOT DETECTED NOT DETECTED Final   Yersinia enterocolitica NOT DETECTED NOT DETECTED Final   Vibrio species NOT DETECTED NOT DETECTED Final   Vibrio cholerae NOT DETECTED NOT DETECTED Final   Enteroaggregative E coli (EAEC) NOT DETECTED NOT DETECTED Final   Enteropathogenic E coli (EPEC) NOT DETECTED NOT DETECTED Final   Enterotoxigenic E coli (ETEC) NOT DETECTED NOT DETECTED Final   Shiga like toxin producing E coli (STEC) NOT DETECTED NOT DETECTED Final   Shigella/Enteroinvasive E coli (EIEC) NOT DETECTED NOT DETECTED Final   Cryptosporidium NOT DETECTED NOT DETECTED Final   Cyclospora cayetanensis NOT DETECTED NOT DETECTED Final   Entamoeba histolytica NOT DETECTED NOT DETECTED Final   Giardia lamblia NOT DETECTED NOT DETECTED Final   Adenovirus F40/41 NOT DETECTED NOT DETECTED Final   Astrovirus NOT DETECTED NOT DETECTED Final   Norovirus GI/GII DETECTED (A) NOT DETECTED Final    Comment: RCRV  AMBER L., RN AT 1343 05/14/24    Rotavirus A NOT DETECTED NOT DETECTED Final   Sapovirus (I, II, IV, and V) NOT DETECTED NOT DETECTED Final    Comment: Performed at [facility], [address] [place], [place] [address]    Radiology Studies: CT ABDOMEN PELVIS W CONTRAST Result Date: 05/13/2024 CLINICAL DATA:  Abdominal pain. EXAM: CT ABDOMEN AND PELVIS WITH CONTRAST TECHNIQUE: Multidetector CT imaging of the abdomen and pelvis was performed using the standard protocol following bolus administration of intravenous contrast. RADIATION DOSE REDUCTION: This exam was performed according to the departmental dose-optimization program which includes automated exposure control, adjustment of the mA and/or kV according to patient size and/or use of iterative  reconstruction technique. CONTRAST:  75mL OMNIPAQUE IOHEXOL  350 MG/ML SOLN COMPARISON:  CT dated 02/13/2020. FINDINGS: Lower chest: The visualized lung bases are clear. No intra-abdominal free air or free fluid. Hepatobiliary: The liver is unremarkable. No dilatation. Multiple gallstones. No pericholecystic fluid or evidence of acute cholecystitis by CT. Pancreas: Unremarkable. No pancreatic ductal dilatation or surrounding inflammatory changes. Spleen: Normal in size without focal abnormality. Adrenals/Urinary Tract: The adrenal glands are unremarkable. There is no hydronephrosis on either side. There is symmetric enhancement and excretion of contrast by both kidneys. The visualized ureters and urinary bladder appear unremarkable. Stomach/Bowel: There is loose stool throughout the colon consistent with diarrheal state. There is no bowel obstruction or active inflammation. The appendix is normal. Vascular/Lymphatic: Mild aortoiliac atherosclerotic disease. The IVC is unremarkable. No portal venous gas. There is no adenopathy. Improved nonspecific upper mesentery stranding. Reproductive: Prostatectomy. Other: Midline vertical anterior pelvic wall incisional scar. Musculoskeletal: Osteopenia with degenerative changes. No acute osseous pathology. IMPRESSION: 1. Diarrheal state. Mesh in correlation with clinical exam and stool cultures recommended. No bowel obstruction. Normal appendix. 2. Cholelithiasis. 3.  Aortic Atherosclerosis (ICD10-I70.0). Electronically Signed   By: Vanetta Chou M.D.   On: 05/13/2024 14:54   Scheduled Meds:  aspirin  EC  81 mg Oral Daily   B-complex with vitamin C  1 tablet Oral Daily   brimonidine   1 drop Right Eye BID   brimonidine   1 drop Left Eye BID   cholecalciferol   2,000 Units Oral Daily   clopidogrel   75 mg Oral Daily   desmopressin   0.1 mg Oral BID   heparin  5,000 Units Subcutaneous Q8H   hydrocortisone   10 mg Oral q1800   hydrocortisone   20 mg Oral q AM   latanoprost    1 drop Both Eyes QHS   levothyroxine   112 mcg Oral QAC breakfast   pantoprazole (PROTONIX) IV  40 mg Intravenous Q12H   sodium chloride  flush  3 mL Intravenous Q12H   sodium chloride  flush  3-10 mL Intravenous Q12H   timolol   1 drop Left Eye BID   Continuous Infusions:  sodium chloride       LOS: 0 days   Alejandro Marker, DO [facility] Available via Epic secure chat 7am-7pm After these hours, please refer to coverage provider listed on [email or web address] 05/14/2024, 8:29 PM

## 2024-05-14 NOTE — Care Management Obs Status (Signed)
 MEDICARE OBSERVATION STATUS NOTIFICATION   Patient Details  Name: Alexander Yoder MRN: [id number] Date of Birth: 1930-08-03   Medicare Observation Status Notification Given:  Yes Spoke with patient and made him aware of the Observation Status patient acknowledge understanding   Claretta Deed 05/14/2024, 9:37 AM

## 2024-05-15 DIAGNOSIS — R197 Diarrhea, unspecified: Secondary | ICD-10-CM | POA: Diagnosis not present

## 2024-05-15 DIAGNOSIS — R112 Nausea with vomiting, unspecified: Secondary | ICD-10-CM | POA: Diagnosis not present

## 2024-05-15 DIAGNOSIS — E271 Primary adrenocortical insufficiency: Secondary | ICD-10-CM

## 2024-05-15 DIAGNOSIS — A0811 Acute gastroenteropathy due to Norwalk agent: Secondary | ICD-10-CM | POA: Diagnosis not present

## 2024-05-15 LAB — CBC WITH DIFFERENTIAL/PLATELET
Abs Immature Granulocytes: 0.03 10*3/uL (ref 0.00–0.07)
Basophils Absolute: 0 10*3/uL (ref 0.0–0.1)
Basophils Relative: 0 %
Eosinophils Absolute: 0.1 10*3/uL (ref 0.0–0.5)
Eosinophils Relative: 2 %
HCT: 48.2 % (ref 39.0–52.0)
Hemoglobin: 16.6 g/dL (ref 13.0–17.0)
Immature Granulocytes: 0 %
Lymphocytes Relative: 18 %
Lymphs Abs: 1.5 10*3/uL (ref 0.7–4.0)
MCH: 28.6 pg (ref 26.0–34.0)
MCHC: 34.4 g/dL (ref 30.0–36.0)
MCV: 83.1 fL (ref 80.0–100.0)
Monocytes Absolute: 0.8 10*3/uL (ref 0.1–1.0)
Monocytes Relative: 10 %
Neutro Abs: 6 10*3/uL (ref 1.7–7.7)
Neutrophils Relative %: 70 %
Platelets: 196 10*3/uL (ref 150–400)
RBC: 5.8 MIL/uL (ref 4.22–5.81)
RDW: 14.2 % (ref 11.5–15.5)
WBC: 8.5 10*3/uL (ref 4.0–10.5)
nRBC: 0 % (ref 0.0–0.2)

## 2024-05-15 LAB — GLUCOSE, CAPILLARY
Glucose-Capillary: 103 mg/dL — ABNORMAL HIGH (ref 70–99)
Glucose-Capillary: 105 mg/dL — ABNORMAL HIGH (ref 70–99)
Glucose-Capillary: 114 mg/dL — ABNORMAL HIGH (ref 70–99)
Glucose-Capillary: 136 mg/dL — ABNORMAL HIGH (ref 70–99)
Glucose-Capillary: 173 mg/dL — ABNORMAL HIGH (ref 70–99)
Glucose-Capillary: 93 mg/dL (ref 70–99)

## 2024-05-15 LAB — COMPREHENSIVE METABOLIC PANEL WITH GFR
ALT: 30 U/L (ref 0–44)
AST: 54 U/L — ABNORMAL HIGH (ref 15–41)
Albumin: 3.1 g/dL — ABNORMAL LOW (ref 3.5–5.0)
Alkaline Phosphatase: 44 U/L (ref 38–126)
Anion gap: 9 (ref 5–15)
BUN: 10 mg/dL (ref 8–23)
CO2: 22 mmol/L (ref 22–32)
Calcium: 7.7 mg/dL — ABNORMAL LOW (ref 8.9–10.3)
Chloride: 97 mmol/L — ABNORMAL LOW (ref 98–111)
Creatinine, Ser: 0.97 mg/dL (ref 0.61–1.24)
GFR, Estimated: >60 mL/min (ref >60)
Glucose, Bld: 87 mg/dL (ref 70–99)
Potassium: 3.3 mmol/L — ABNORMAL LOW (ref 3.5–5.1)
Sodium: 128 mmol/L — ABNORMAL LOW (ref 135–145)
Total Bilirubin: 0.5 mg/dL (ref 0.0–1.2)
Total Protein: 5.7 g/dL — ABNORMAL LOW (ref 6.5–8.1)

## 2024-05-15 LAB — MAGNESIUM: Magnesium: 1.9 mg/dL (ref 1.7–2.4)

## 2024-05-15 LAB — D-DIMER, QUANTITATIVE: D-Dimer, Quant: 1.21 ug{FEU}/mL — ABNORMAL HIGH (ref 0.00–0.50)

## 2024-05-15 LAB — PHOSPHORUS: Phosphorus: 1.7 mg/dL — ABNORMAL LOW (ref 2.5–4.6)

## 2024-05-15 LAB — LACTIC ACID, PLASMA: Lactic Acid, Venous: 1.1 mmol/L (ref 0.5–1.9)

## 2024-05-15 MED ORDER — POTASSIUM CHLORIDE CRYS ER 20 MEQ PO TBCR
40.0000 meq | EXTENDED_RELEASE_TABLET | Freq: Two times a day (BID) | ORAL | Status: AC
  Administered 2024-05-15 (×2): 40 meq via ORAL
  Filled 2024-05-15 (×2): qty 2

## 2024-05-15 MED ORDER — ALUM & MAG HYDROXIDE-SIMETH 200-200-20 MG/5ML PO SUSP
30.0000 mL | ORAL | Status: DC | PRN
  Administered 2024-05-15 – 2024-05-16 (×3): 30 mL via ORAL
  Filled 2024-05-15 (×3): qty 30

## 2024-05-15 MED ORDER — HYDROCORTISONE SOD SUC (PF) 100 MG IJ SOLR
100.0000 mg | Freq: Once | INTRAMUSCULAR | Status: AC
  Administered 2024-05-15: 100 mg via INTRAVENOUS
  Filled 2024-05-15: qty 2

## 2024-05-15 MED ORDER — POLYETHYLENE GLYCOL 3350 17 G PO PACK: 17.0000 g | PACK | Freq: Every day | ORAL | Status: DC | PRN

## 2024-05-15 MED ORDER — PANTOPRAZOLE SODIUM 40 MG PO TBEC
40.0000 mg | DELAYED_RELEASE_TABLET | Freq: Two times a day (BID) | ORAL | Status: DC
  Administered 2024-05-15 – 2024-05-16 (×2): 40 mg via ORAL
  Filled 2024-05-15 (×2): qty 1

## 2024-05-15 MED ORDER — PROCHLORPERAZINE EDISYLATE 10 MG/2ML IJ SOLN
5.0000 mg | Freq: Four times a day (QID) | INTRAMUSCULAR | Status: DC | PRN
  Administered 2024-05-15: 5 mg via INTRAVENOUS
  Filled 2024-05-15 (×2): qty 2

## 2024-05-15 MED ORDER — SODIUM PHOSPHATES 45 MMOLE/15ML IV SOLN
30.0000 mmol | Freq: Once | INTRAVENOUS | Status: AC
  Administered 2024-05-15: 30 mmol via INTRAVENOUS
  Filled 2024-05-15: qty 10

## 2024-05-15 MED ORDER — SODIUM CHLORIDE 0.9 % IV SOLN: INTRAVENOUS | Status: AC

## 2024-05-15 NOTE — Evaluation (Signed)
 Physical Therapy Evaluation Patient Details Name: Alexander Yoder MRN: [id number] DOB: 02/11/1930 Today's Date: 05/15/2024  History of Present Illness  Pt is a 89 y/o M admitted on 05/13/24 after presenting with c/o N&V, diarrhea & abdominal discomfort. PT is being treated for norovirus. PMH: HLD, pituitary tumor with pan hypothyroidism, diabetes insipidus, prostate CA  Clinical Impression  Pt seen for PT evaluation with pt reporting nausea, dry heaving. Nurse made aware & administered anti nausea meds. Pt reports prior to admission he was living with his wife in 1 level home, caring for her as she's in a w/c (he does have an aide assist with caregiving for her 5 hours/day). On this date, pt is able to complete supine>sit with supervision, sit>stand & ambulate around bed without AD with CGA. Pt reports feeling better by end of session. Will continue to follow pt acutely to progress mobility as able.        If plan is discharge home, recommend the following: A little help with walking and/or transfers;A little help with bathing/dressing/bathroom;Assistance with cooking/housework;Assist for transportation;Help with stairs or ramp for entrance   Can travel by private vehicle        Equipment Recommendations None recommended by PT  Recommendations for Other Services       Functional Status Assessment Patient has had a recent decline in their functional status and demonstrates the ability to make significant improvements in function in a reasonable and predictable amount of time.     Precautions / Restrictions Precautions Precautions: Fall Precaution/Restrictions Comments: rectal tube Restrictions Weight Bearing Restrictions Per Provider Order: No      Mobility  Bed Mobility Overal bed mobility: Needs Assistance Bed Mobility: Supine to Sit     Supine to sit: Supervision, HOB elevated, Used rails (exit R side of the bed)          Transfers Overall transfer level: Needs  assistance Equipment used: None Transfers: Sit to/from Stand Sit to Stand: Min assist, Contact guard assist           General transfer comment: sit>stand from EOB    Ambulation/Gait Ambulation/Gait assistance: Contact guard assist Gait Distance (Feet): 10 Feet Assistive device: None Gait Pattern/deviations: Decreased step length - right, Decreased step length - left, Decreased dorsiflexion - right, Decreased dorsiflexion - left, Decreased stride length Gait velocity: decreased     General Gait Details: decreased heel strike  Stairs            Wheelchair Mobility     Tilt Bed    Modified Rankin (Stroke Patients Only)       Balance Overall balance assessment: Needs assistance Sitting-balance support: Feet supported Sitting balance-Leahy Scale: Good Sitting balance - Comments: supervision sitting   Standing balance support: During functional activity, No upper extremity supported Standing balance-Leahy Scale: Fair                               Pertinent Vitals/Pain Pain Assessment Pain Assessment: No/denies pain    Home Living Family/patient expects to be discharged to:: Private residence Living Arrangements: Spouse/significant other Available Help at Discharge: Family;Available PRN/intermittently Type of Home: House Home Access: Ramped entrance       Home Layout: One level Home Equipment: Shower seat      Prior Function Prior Level of Function : Independent/Modified Independent;Working/employed;Driving             Mobility Comments: Independent without AD, still mows his own  lawn, drives, cares for his 36 y/o wife who's in a w/c (does have assistance with her 5 hours/day). ADLs Comments: Independent with cooking, cleaning, bathing & dressing.     Extremity/Trunk Assessment   Upper Extremity Assessment Upper Extremity Assessment: Overall WFL for tasks assessed    Lower Extremity Assessment Lower Extremity Assessment:  Generalized weakness       Communication   Communication Communication: Impaired Factors Affecting Communication: Hearing impaired    Cognition Arousal: Alert Behavior During Therapy: WFL for tasks assessed/performed   PT - Cognitive impairments: No apparent impairments                         Following commands: Intact       Cueing Cueing Techniques: Verbal cues     General Comments General comments (skin integrity, edema, etc.): c/o dizziness upon sitting EOB, notes symptoms decrease with prolonged sitting    Exercises     Assessment/Plan    PT Assessment Patient needs continued PT services  PT Problem List Decreased strength;Decreased range of motion;Decreased activity tolerance;Decreased balance;Decreased mobility;Decreased knowledge of use of DME       PT Treatment Interventions Gait training;Neuromuscular re-education;DME instruction;Balance training;Stair training;Functional mobility training;Therapeutic exercise;Therapeutic activities;Patient/family education;Modalities    PT Goals (Current goals can be found in the Care Plan section)  Acute Rehab PT Goals Patient Stated Goal: feel better PT Goal Formulation: With patient Time For Goal Achievement: 05/29/24 Potential to Achieve Goals: Good    Frequency Min 3X/week     Co-evaluation PT/OT/SLP Co-Evaluation/Treatment: Yes Reason for Co-Treatment:  (unsure if pt will tolerate 2 sessions 2/2 N&V)           AM-PAC PT 6 Clicks Mobility  Outcome Measure Help needed turning from your back to your side while in a flat bed without using bedrails?: None Help needed moving from lying on your back to sitting on the side of a flat bed without using bedrails?: A Little Help needed moving to and from a bed to a chair (including a wheelchair)?: A Little Help needed standing up from a chair using your arms (e.g., wheelchair or bedside chair)?: A Little Help needed to walk in hospital room?: A  Little Help needed climbing 3-5 steps with a railing? : A Little 6 Click Score: 19    End of Session   Activity Tolerance:  (limited 2/2 N&V) Patient left: in chair;with chair alarm set;with call bell/phone within reach Nurse Communication: Mobility status PT Visit Diagnosis: Unsteadiness on feet (R26.81);Muscle weakness (generalized) (M62.81)    Time: 0935-1010 PT Time Calculation (min) (ACUTE ONLY): 35 min   Charges:   PT Evaluation $PT Eval Low Complexity: 1 Low   PT General Charges $$ ACUTE PT VISIT: 1 Visit         Richerd Pinal, PT, DPT 05/15/24, 10:30 AM   Richerd CHRISTELLA Pinal 05/15/2024, 10:29 AM

## 2024-05-15 NOTE — Progress Notes (Signed)
 PROGRESS NOTE    Alexander Yoder  FMW:[id number] DOB: 01-17-1930 DOA: 05/13/2024 PCP: Charlott Dorn LABOR, MD   Brief Narrative:  Alexander Yoder is a 89 y.o. 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  There is concern that he may have had some adrenal insufficiency so he was given a dose of hydrocortisone  100 mg.  Further workup reveals that he has norovirus and symptoms were improving yesterday however he was nauseous and felt a little worse today so we will continue IV fluid hydration for another 12 hours and give him another dose of IV hydrocortisone  100 mg x 1.  Will continue supportive care and replete electrolyte abnormalities  Assessment and Plan:  N/V/Diarrhea: In the setting of Norovirus. Will D/C Abx. D-Dimer and CRP likely elevated in the setting of Infection from Noroviurs. Repeat in the AM. C/w Supprotive Care and IV fluids to be reduced to 75 mL/h for another 12 more hours.  Continue with antiemetics given the patient was nauseous today and vomited.  Abdominal discomfort likely in setting of norovirus gastroenteritis.  Will go to clear liquid diet to full liquid diet to SOFT Diet.  Continue to monitor  Lactic Acidosis: Lactic Acid Trend: Recent Labs  Lab 05/13/24 1103 05/13/24 1222 05/13/24 1717 05/14/24 1010 05/14/24 1300 05/14/24 2114 05/15/24 0016  LATICACIDVEN 2.5* 6.8* 2.1* 2.2* 2.3* 1.6 1.1  -Bolused yesterday afternoon and getting mIVF as above. CTM and Trend and repeaing LA and this is now improved.  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 -Continue levothyroxine , DDAVP , hydrocortisone  -Patient's presentation was concerning for mild to moderate adrenal insufficiency suspect he may not have taken stress dose during his illness and therefore we will start patient on Hydrocortisone  100 mg IV x 1 followed by his p.o. doses.  Will give him another 100 mg hydrocortisone  injection today given his nausea vomiting and drop in sodium and low potassium -TSH was 0.036 and free T4 was 0.91   Chronic Diastolic CHF: Last echocardiogram from 2018 noted EF to be 60 to 65% with grade 1 diastolic dysfunction at that time.  Patient appears to be dehydrated so will c/w IVF as above for another 12 hours.  Strict I's and O's and daily weights and will need to continue to monitor for signs and symptoms of volume overload  Hyponatremia/ Hypokalemia / Hypophosphatemia: Repelte w/ IV NaPhos 30 mmol and po KCL 40 mEQ BID. CTM and Replete Electrolytes as Necessary. Repeat CMP, Mag, Phos in the AM.     Polycythemia:  Chronic. Would recommend patient to discontinue his testosterone  injections as it puts him at risk for hypercoagulability. Hgb/Hct Trend:  Recent Labs  Lab 05/13/24 1053 05/14/24 0541 05/15/24 0641  HGB 19.9* 18.4* 16.6  HCT 57.4* 53.6* 48.2  MCV 81.9 82.5 83.1  -CTM and Trend and repeat CMP in the AM    AKI / Metabolic Acidosis: In setting of dehydration and prerenal causes from his GI losses BUN/Cr Trend: Recent Labs  Lab 05/13/24 1215 05/14/24 0541 05/15/24 0641  BUN 17 19 10   CREATININE 1.31* 1.50* 0.97  -IVF as above. MA improved  as patient has a CO2 of 22, anion gap of 9, chloride level of 97 -Avoid Nephrotoxic Medications, Contrast Dyes, Hypotension and Dehydration to Ensure Adequate Renal Perfusion and will need to Renally Adjust Meds. CTM and Trend Renal Function carefully and repeat CMP in the AM   Evaded Troponin: Likely demand ischemia in the setting of dehydration from  his norovirus  H/O basilar artery / carotid artery stenosis/ H/O TIA/MCA stenosis: C/w Clopidogrel .    Hypogonadism: D/c Testosterone  for now.  PSA 0.27   Glaucoma: Cont eye drops. Cont alphagan  combigan  and xalatan .   Hypoalbuminemia: Patient's Albumin Level went from 3.2 -> 3.3 -> 3.1. CTM and Trend and repeat CMP in the AM  Overweight: Complicates overall prognosis and care. Estimated body mass index is 26.67 kg/m as calculated from the following:   Height as of this encounter: 5' 9.5 (1.765 m).   Weight as of this encounter: 83.1 kg. Weight Loss and Dietary Counseling given   DVT prophylaxis: heparin  injection 5,000 Units Start: 05/13/24 1700    Code Status: Full Code Family Communication: No family present @ bedside  Disposition Plan:  Level of care: Telemetry Medical Status is: Inpatient Remains inpatient appropriate because: Needs further clinical improvement and have symptoms resolved before D/C   Consultants:  None  Procedures:  As delineated as above  Antimicrobials:  Anti-infectives (From admission, onward)    Start     Dose/Rate Route Frequency Ordered Stop   05/13/24 2200  metroNIDAZOLE  (FLAGYL ) IVPB 500 mg  Status:  Discontinued        500 mg 100 mL/hr over 60 Minutes Intravenous Every 12 hours 05/13/24 1647 05/14/24 2014   05/13/24 1345  cefTRIAXone  (ROCEPHIN ) 2 g in sodium chloride  0.9 % 100 mL IVPB  Status:  Discontinued        2 g 200 mL/hr over 30 Minutes Intravenous Every 24 hours 05/13/24 1344 05/14/24 2014   05/13/24 1345  metroNIDAZOLE  (FLAGYL ) IVPB 500 mg        500 mg 100 mL/hr over 60 Minutes Intravenous  Once 05/13/24 1344 05/13/24 1531       Subjective: Seen and examined at bedside and he states that he is not feeling well at all today and had vomited earlier.  Wanting to rest.  Still having some diarrhea.  No other concerns or complaints at this time.  Objective: Vitals:   05/14/24 2010 05/15/24 0407 05/15/24 0800 05/15/24 1730  BP:  (!) 158/87 139/72 (!) 149/79 (!) 157/69  Pulse: 84 66 70 73  Resp: 20 15  19   Temp: 98.4 F (36.9 C) 97.9 F (36.6 C) 97.9 F (36.6 C) 98 F (36.7 C)  TempSrc: Oral Oral Oral   SpO2: 96% 98% 98% 100%  Weight:  83.1 kg    Height:        Intake/Output Summary (Last 24 hours) at 05/15/2024 1854 Last data filed at 05/15/2024 1707 Gross per 24 hour  Intake 934.45 ml  Output 1175 ml  Net -240.55 ml   Filed Weights   05/13/24 0958 05/13/24 2030 05/15/24 0407  Weight: 79.8 kg 80.9 kg 83.1 kg   Examination: Physical Exam:  Constitutional: WN/WD elderly slightly overweight Caucasian male in no acute distress Respiratory: Diminished to auscultation bilaterally with some coarse breath sounds, no wheezing, rales, rhonchi or crackles. Normal respiratory effort and patient is not tachypenic. No accessory muscle use.  Unlabored breathing Cardiovascular: RRR, no murmurs / rubs / gallops. S1 and S2 auscultated. No extremity edema.  Abdomen:  Soft, a little-tender, non-distended. Bowel sounds positive x4.  GU: Deferred. Musculoskeletal: No clubbing / cyanosis of digits/nails. No joint deformity upper and lower extremities. Skin: No rashes, lesions, ulcers on limited skin evaluation. No induration; Warm and dry.  Neurologic: CN 2-12 grossly intact with no focal deficits. Romberg sign and cerebellar reflexes not assessed.  Psychiatric: Normal judgment and insight. Alert and oriented x 3. Normal mood and appropriate affect.   Data Reviewed: I have personally reviewed following labs and imaging studies  CBC: Recent Labs  Lab 05/13/24 1053 05/14/24 0541 05/15/24 0641  WBC 9.4 7.5 8.5  NEUTROABS 7.3  --  6.0  HGB 19.9* 18.4* 16.6  HCT 57.4* 53.6* 48.2  MCV 81.9 82.5 83.1  PLT 283 223 196   Basic Metabolic Panel: Recent Labs  Lab 05/13/24 1215 05/13/24 2110 05/14/24 0541 05/15/24 0641  NA 135  --  133* 128*  K 4.2  --  4.0 3.3*  CL 101  --  105 97*  CO2 20*  --  17* 22  GLUCOSE 68*   --  117* 87  BUN 17  --  19 10  CREATININE 1.31*  --  1.50* 0.97  CALCIUM  8.2*  --  8.0* 7.7*  MG  --  1.8 1.8 1.9  PHOS  --   --   --  1.7*   GFR: Estimated Creatinine Clearance: 50.4 mL/min (by C-G formula based on SCr of 0.97 mg/dL). Liver Function Tests: Recent Labs  Lab 05/13/24 1215 05/14/24 0541 05/15/24 0641  AST 36 41 54*  ALT 31 31 30   ALKPHOS 47 51 44  BILITOT 0.7 0.9 0.5  PROT 5.7* 6.1* 5.7*  ALBUMIN 3.2* 3.3* 3.1*   Recent Labs  Lab 05/13/24 1215  LIPASE 29   No results for input(s): AMMONIA in the last 168 hours. Coagulation Profile: No results for input(s): INR, PROTIME in the last 168 hours. Cardiac Enzymes: No results for input(s): CKTOTAL, CKMB, CKMBINDEX, TROPONINI in the last 168 hours. BNP (last 3 results) No results for input(s): PROBNP in the last 8760 hours. HbA1C: No results for input(s): HGBA1C in the last 72 hours. CBG: Recent Labs  Lab 05/15/24 0013 05/15/24 0404 05/15/24 0852 05/15/24 1136 05/15/24 1731  GLUCAP 114* 93 103* 105* 173*   Lipid Profile: No results for input(s): CHOL, HDL, LDLCALC, TRIG, CHOLHDL, LDLDIRECT in the last 72 hours. Thyroid  Function Tests: Recent Labs    05/13/24 1708  TSH 0.036*  FREET4 0.91   Anemia Panel: No results for input(s): VITAMINB12, FOLATE, FERRITIN, TIBC, IRON, RETICCTPCT in the last 72 hours. Sepsis Labs: Recent Labs  Lab 05/14/24 1010 05/14/24 1300 05/14/24 2114 05/15/24 0016  LATICACIDVEN 2.2* 2.3* 1.6 1.1   Recent Results (from the past 240 hours)  Blood culture (routine x 2)     Status: None (Preliminary result)   Collection Time: 05/13/24 10:15 AM   Specimen: BLOOD  Result Value Ref Range Status   Specimen Description BLOOD SITE NOT SPECIFIED  Final   Special Requests   Final    BOTTLES DRAWN AEROBIC ONLY Blood Culture results may not be optimal due to an inadequate volume of blood received in culture bottles   Culture   Final     NO GROWTH 2 DAYS Performed at [facility], [address], [place], [place] [address]    Report Status PENDING  Incomplete  C Difficile Quick Screen w PCR reflex     Status: None   Collection Time: 05/13/24 10:15  AM   Specimen: STOOL  Result Value Ref Range Status   C Diff antigen NEGATIVE NEGATIVE Final   C Diff toxin NEGATIVE NEGATIVE Final   C Diff interpretation No C. difficile detected.  Final    Comment: Performed at [facility], [address] [place], [place] [address]  Resp panel by RT-PCR (RSV, Flu A&B, Covid) Anterior Nasal Swab     Status: None   Collection Time: 05/13/24 10:15 AM   Specimen: Anterior Nasal Swab  Result Value Ref Range Status   SARS Coronavirus 2 by RT PCR NEGATIVE NEGATIVE Final   Influenza A by PCR NEGATIVE NEGATIVE Final   Influenza B by PCR NEGATIVE NEGATIVE Final    Comment: (NOTE) The Xpert Xpress SARS-CoV-2/FLU/RSV plus assay is intended as an aid in the diagnosis of influenza from Nasopharyngeal swab specimens and should not be used as a sole basis for treatment. Nasal washings and aspirates are unacceptable for Xpert Xpress SARS-CoV-2/FLU/RSV testing.  Fact Sheet for Patients: BloggerCourse.com  Fact Sheet for Healthcare Providers: SeriousBroker.it  This test is not yet approved or cleared by the [place]  FDA and has been authorized for detection and/or diagnosis of SARS-CoV-2 by FDA under an Emergency Use Authorization (EUA). This EUA will remain in effect (meaning this test can be used) for the duration of the COVID-19 declaration under Section 564(b)(1) of the Act, 21 U.S.C. section 360bbb-3(b)(1), unless the authorization is terminated or revoked.     Resp Syncytial Virus by PCR NEGATIVE NEGATIVE Final    Comment: (NOTE) Fact Sheet for Patients: BloggerCourse.com  Fact Sheet for Healthcare  Providers: SeriousBroker.it  This test is not yet approved or cleared by the [place]  FDA and has been authorized for detection and/or diagnosis of SARS-CoV-2 by FDA under an Emergency Use Authorization (EUA). This EUA will remain in effect (meaning this test can be used) for the duration of the COVID-19 declaration under Section 564(b)(1) of the Act, 21 U.S.C. section 360bbb-3(b)(1), unless the authorization is terminated or revoked.  Performed at [facility], [address] [place], [place] [address]   Blood culture (routine x 2)     Status: None (Preliminary result)   Collection Time: 05/13/24  5:05 PM   Specimen: BLOOD RIGHT HAND  Result Value Ref Range Status   Specimen Description BLOOD RIGHT HAND  Final   Special Requests   Final    BOTTLES DRAWN AEROBIC ONLY Blood Culture results may not be optimal due to an inadequate volume of blood received in culture bottles   Culture   Final    NO GROWTH 2 DAYS Performed at [facility], [address], [place], [place] [address]    Report Status PENDING  Incomplete  Gastrointestinal Panel by PCR , Stool     Status: Abnormal   Collection Time: 05/14/24  9:56 AM   Specimen: Stool  Result Value Ref Range Status   Campylobacter species NOT DETECTED NOT DETECTED Final   Plesimonas shigelloides NOT DETECTED NOT DETECTED Final   Salmonella species NOT DETECTED NOT DETECTED Final   Yersinia enterocolitica NOT DETECTED NOT DETECTED Final   Vibrio species NOT DETECTED NOT DETECTED Final   Vibrio cholerae NOT DETECTED NOT DETECTED Final   Enteroaggregative E coli (EAEC) NOT DETECTED NOT DETECTED Final   Enteropathogenic E coli (EPEC) NOT DETECTED NOT DETECTED Final   Enterotoxigenic E coli (ETEC) NOT DETECTED NOT DETECTED Final   Shiga like toxin producing E coli (STEC) NOT DETECTED  NOT DETECTED Final   Shigella/Enteroinvasive E coli (EIEC) NOT DETECTED NOT DETECTED Final   Cryptosporidium NOT  DETECTED NOT DETECTED Final   Cyclospora cayetanensis NOT DETECTED NOT DETECTED Final   Entamoeba histolytica NOT DETECTED NOT DETECTED Final   Giardia lamblia NOT DETECTED NOT DETECTED Final   Adenovirus F40/41 NOT DETECTED NOT DETECTED Final   Astrovirus NOT DETECTED NOT DETECTED Final   Norovirus GI/GII DETECTED (A) NOT DETECTED Final    Comment: RCRV  AMBER L., RN AT 1343 05/14/24    Rotavirus A NOT DETECTED NOT DETECTED Final   Sapovirus (I, II, IV, and V) NOT DETECTED NOT DETECTED Final    Comment: Performed at [facility], [address], [place], [place] [address]    Radiology Studies: No results found.  Scheduled Meds:  aspirin  EC  81 mg Oral Daily   B-complex with vitamin C  1 tablet Oral Daily   brimonidine   1 drop Right Eye BID   brimonidine   1 drop Left Eye BID   cholecalciferol   2,000 Units Oral Daily   clopidogrel   75 mg Oral Daily   desmopressin   0.1 mg Oral BID   heparin   5,000 Units Subcutaneous Q8H   hydrocortisone   10 mg Oral q1800   hydrocortisone   20 mg Oral q AM   latanoprost   1 drop Both Eyes QHS   levothyroxine   112 mcg Oral QAC breakfast   pantoprazole   40 mg Oral BID   potassium chloride  40 mEq Oral BID   sodium chloride  flush  3 mL Intravenous Q12H   sodium chloride  flush  3-10 mL Intravenous Q12H   timolol   1 drop Left Eye BID   Continuous Infusions:  sodium chloride  75 mL/hr at 05/15/24 1615    LOS: 1 day   Alejandro Marker, DO [facility] Available via Epic secure chat 7am-7pm After these hours, please refer to coverage provider listed on [email or web address] 05/15/2024, 6:54 PM

## 2024-05-15 NOTE — Evaluation (Signed)
 Occupational Therapy Evaluation Patient Details Name: Alexander Yoder MRN: [id number] DOB: [birth year] Today's Date: 05/15/2024   History of Present Illness   Pt is a 89 y/o M admitted on 05/13/24 after presenting with c/o N&V, diarrhea & abdominal discomfort. PT is being treated for norovirus. PMH: HLD, pituitary tumor with pan hypothyroidism, diabetes insipidus, prostate CA     Clinical Impressions Pt was independent prior to admission, caring for his wife who is w/c bound and driving. Pt presents with nausea and dry heaves, RN provided medication. Pt with generalized weakness and mild unsteadiness. Pt requires set up to CGA for ADLs and CGA for short distance ambulation in room. He is likely to progress well as medical condition improves. Recommending HHOT, but not require by discharge.      If plan is discharge home, recommend the following:   A little help with walking and/or transfers;A little help with bathing/dressing/bathroom;Assistance with cooking/housework;Assist for transportation;Help with stairs or ramp for entrance     Functional Status Assessment   Patient has had a recent decline in their functional status and demonstrates the ability to make significant improvements in function in a reasonable and predictable amount of time.     Equipment Recommendations   None recommended by OT     Recommendations for Other Services         Precautions/Restrictions   Precautions Precautions: Fall Precaution/Restrictions Comments: rectal tube Restrictions Weight Bearing Restrictions Per Provider Order: No     Mobility Bed Mobility Overal bed mobility: Needs Assistance Bed Mobility: Supine to Sit     Supine to sit: Supervision, HOB elevated, Used rails          Transfers Overall transfer level: Needs assistance Equipment used: None Transfers: Sit to/from Stand Sit to Stand: Min assist, Contact guard assist           General transfer comment:  sit>stand from EOB      Balance Overall balance assessment: Needs assistance Sitting-balance support: Feet supported Sitting balance-Leahy Scale: Good     Standing balance support: During functional activity, No upper extremity supported Standing balance-Leahy Scale: Fair                             ADL either performed or assessed with clinical judgement   ADL Overall ADL's : Needs assistance/impaired Eating/Feeding: Independent;Sitting   Grooming: Set up;Sitting   Upper Body Bathing: Set up;Sitting   Lower Body Bathing: Contact guard assist;Sit to/from stand   Upper Body Dressing : Set up;Sitting   Lower Body Dressing: Contact guard assist;Sit to/from stand   Toilet Transfer: Contact guard assist   Toileting- Clothing Manipulation and Hygiene: Contact guard assist       Functional mobility during ADLs: Contact guard assist       Vision Baseline Vision/History: 1 Wears glasses Ability to See in Adequate Light: 0 Adequate Patient Visual Report: No change from baseline       Perception         Praxis         Pertinent Vitals/Pain Pain Assessment Pain Assessment: No/denies pain     Extremity/Trunk Assessment Upper Extremity Assessment Upper Extremity Assessment: Overall WFL for tasks assessed   Lower Extremity Assessment Lower Extremity Assessment: Defer to PT evaluation   Cervical / Trunk Assessment Cervical / Trunk Assessment: Normal   Communication Communication Communication: Impaired Factors Affecting Communication: Hearing impaired   Cognition Arousal: Alert Behavior During Therapy: [facility] for tasks  assessed/performed Cognition: No apparent impairments                               Following commands: Intact       Cueing  General Comments   Cueing Techniques: Verbal cues  c/o dizziness upon sitting EOB, notes symptoms decrease with prolonged sitting   Exercises     Shoulder Instructions      Home  Living Family/patient expects to be discharged to:: Private residence Living Arrangements: Spouse/significant other Available Help at Discharge: Family;Available PRN/intermittently Type of Home: House Home Access: Ramped entrance     Home Layout: One level     Bathroom Shower/Tub: Walk-in shower         Home Equipment: Shower seat          Prior Functioning/Environment Prior Level of Function : Independent/Modified Independent;Working/employed;Driving             Mobility Comments: Independent without AD, still mows his own lawn, drives, cares for his 4 y/o wife who's in a w/c (does have assistance with her 5 hours/day). ADLs Comments: Independent with cooking, cleaning, bathing & dressing.    OT Problem List: Decreased strength;Impaired balance (sitting and/or standing)   OT Treatment/Interventions: Self-care/ADL training;Therapeutic activities;Patient/family education;Balance training      OT Goals(Current goals can be found in the care plan section)   Acute Rehab OT Goals OT Goal Formulation: With patient Time For Goal Achievement: 05/29/24 Potential to Achieve Goals: Good ADL Goals Pt Will Perform Grooming: Independently;standing Pt Will Perform Lower Body Bathing: Independently;sit to/from stand Pt Will Perform Lower Body Dressing: Independently;sit to/from stand Pt Will Transfer to Toilet: Independently;ambulating Pt Will Perform Toileting - Clothing Manipulation and hygiene: Independently;sit to/from stand Pt Will Perform Tub/Shower Transfer: Shower transfer;with modified independence;ambulating   OT Frequency:  Min 2X/week    Co-evaluation   Reason for Co-Treatment:  (unsure if pt will tolerate 2 sessions 2/2 N&V)          AM-PAC OT 6 Clicks Daily Activity     Outcome Measure Help from another person eating meals?: None Help from another person taking care of personal grooming?: A Little Help from another person toileting, which includes  using toliet, bedpan, or urinal?: A Little Help from another person bathing (including washing, rinsing, drying)?: A Little Help from another person to put on and taking off regular upper body clothing?: A Little Help from another person to put on and taking off regular lower body clothing?: A Little 6 Click Score: 19   End of Session Nurse Communication: Other (comment) (provided nausea meds)  Activity Tolerance: Patient tolerated treatment well Patient left: in chair;with call bell/phone within reach;with chair alarm set  OT Visit Diagnosis: Unsteadiness on feet (R26.81);Other abnormalities of gait and mobility (R26.89);Muscle weakness (generalized) (M62.81)                Time: 9064-8988 OT Time Calculation (min): 36 min Charges:  OT General Charges $OT Visit: 1 Visit OT Evaluation $OT Eval Low Complexity: 1 Low OT Treatments $Self Care/Home Management : 8-22 mins  Mliss HERO, OTR/L Acute Rehabilitation Services Office: [phone]   Kennth Mliss Helling 05/15/2024, 10:40 AM

## 2024-05-15 NOTE — Progress Notes (Signed)
 PHARMACIST - PHYSICIAN COMMUNICATION  DR:   Sherrill  CONCERNING: IV to Oral Route Change Policy  RECOMMENDATION: This patient is receiving Protonix  by the intravenous route.  Based on criteria approved by the Pharmacy and Therapeutics Committee, the intravenous medication(s) is/are being converted to the equivalent oral dose form(s).   DESCRIPTION: These criteria include: The patient is eating (either orally or via tube) and/or has been taking other orally administered medications for a least 24 hours The patient has no evidence of active gastrointestinal bleeding or impaired GI absorption (gastrectomy, short bowel, patient on TNA or NPO).  If you have questions about this conversion, please contact the Pharmacy Department  []   [phone] )  [facility] []   [phone] )  [facility] [x]   [phone] )  [facility] []   [phone] )  [facility] []   [phone] )  [facility]

## 2024-05-15 NOTE — TOC Progression Note (Signed)
 Transition of Care [facility]) - Progression Note    Patient Details  Name: Alexander Yoder MRN: [id number] Date of Birth: 01/29/1930  Transition of Care [facility]) CM/SW Contact  Tom-Johnson, Mahari Vankirk Daphne, RN Phone Number: 05/15/2024, 11:22 AM  Clinical Narrative:     CM spoke with patient at bedside and daughter, Leita via phone about [facility] recommendations. Both has no preference. CM sent referral to [facility] and [facility] noted acceptance, info on AVS.   CM will continue to follow as patient progresses with care towards discharge.        Expected Discharge Plan: Home w Home Health Services    Expected Discharge Plan and Services   Discharge Planning Services: CM Consult                               HH Arranged: PT, OT [facility] Agency: [facility] Date [facility] Agency Contacted: 05/15/24 Time HH Agency Contacted: 1110 Representative spoke with at [facility] Agency: [facility]   Social Determinants of Health (SDOH) Interventions SDOH Screenings   Food Insecurity: No Food Insecurity (05/13/2024)  Housing: Low Risk  (05/13/2024)  Transportation Needs: No Transportation Needs (05/13/2024)  Utilities: Not At Risk (05/13/2024)  Tobacco Use: Low Risk  (05/13/2024)    Readmission Risk Interventions    05/14/2024    4:02 PM  Readmission Risk Prevention Plan  Post Dischage Appt Complete  Medication Screening Complete  Transportation Screening Complete

## 2024-05-16 DIAGNOSIS — R197 Diarrhea, unspecified: Secondary | ICD-10-CM | POA: Diagnosis not present

## 2024-05-16 DIAGNOSIS — E871 Hypo-osmolality and hyponatremia: Secondary | ICD-10-CM | POA: Diagnosis not present

## 2024-05-16 DIAGNOSIS — R112 Nausea with vomiting, unspecified: Secondary | ICD-10-CM | POA: Diagnosis not present

## 2024-05-16 DIAGNOSIS — A0811 Acute gastroenteropathy due to Norwalk agent: Secondary | ICD-10-CM | POA: Diagnosis not present

## 2024-05-16 LAB — GLUCOSE, CAPILLARY
Glucose-Capillary: 103 mg/dL — ABNORMAL HIGH (ref 70–99)
Glucose-Capillary: 111 mg/dL — ABNORMAL HIGH (ref 70–99)
Glucose-Capillary: 112 mg/dL — ABNORMAL HIGH (ref 70–99)
Glucose-Capillary: 116 mg/dL — ABNORMAL HIGH (ref 70–99)
Glucose-Capillary: 94 mg/dL (ref 70–99)

## 2024-05-16 LAB — COMPREHENSIVE METABOLIC PANEL WITH GFR
ALT: 32 U/L (ref 0–44)
AST: 63 U/L — ABNORMAL HIGH (ref 15–41)
Albumin: 3.4 g/dL — ABNORMAL LOW (ref 3.5–5.0)
Alkaline Phosphatase: 51 U/L (ref 38–126)
Anion gap: 10 (ref 5–15)
BUN: 8 mg/dL (ref 8–23)
CO2: 19 mmol/L — ABNORMAL LOW (ref 22–32)
Calcium: 8.2 mg/dL — ABNORMAL LOW (ref 8.9–10.3)
Chloride: 100 mmol/L (ref 98–111)
Creatinine, Ser: 0.81 mg/dL (ref 0.61–1.24)
GFR, Estimated: >60 mL/min (ref >60)
Glucose, Bld: 84 mg/dL (ref 70–99)
Potassium: 4 mmol/L (ref 3.5–5.1)
Sodium: 129 mmol/L — ABNORMAL LOW (ref 135–145)
Total Bilirubin: 0.8 mg/dL (ref 0.0–1.2)
Total Protein: 6.4 g/dL — ABNORMAL LOW (ref 6.5–8.1)

## 2024-05-16 LAB — CBC WITH DIFFERENTIAL/PLATELET
Abs Immature Granulocytes: 0.04 10*3/uL (ref 0.00–0.07)
Basophils Absolute: 0 10*3/uL (ref 0.0–0.1)
Basophils Relative: 0 %
Eosinophils Absolute: 0.1 10*3/uL (ref 0.0–0.5)
Eosinophils Relative: 1 %
HCT: 49.9 % (ref 39.0–52.0)
Hemoglobin: 17.1 g/dL — ABNORMAL HIGH (ref 13.0–17.0)
Immature Granulocytes: 1 %
Lymphocytes Relative: 17 %
Lymphs Abs: 1.5 10*3/uL (ref 0.7–4.0)
MCH: 28.1 pg (ref 26.0–34.0)
MCHC: 34.3 g/dL (ref 30.0–36.0)
MCV: 81.9 fL (ref 80.0–100.0)
Monocytes Absolute: 0.8 10*3/uL (ref 0.1–1.0)
Monocytes Relative: 9 %
Neutro Abs: 6.1 10*3/uL (ref 1.7–7.7)
Neutrophils Relative %: 72 %
Platelets: 218 10*3/uL (ref 150–400)
RBC: 6.09 MIL/uL — ABNORMAL HIGH (ref 4.22–5.81)
RDW: 13.9 % (ref 11.5–15.5)
WBC: 8.5 10*3/uL (ref 4.0–10.5)
nRBC: 0 % (ref 0.0–0.2)

## 2024-05-16 LAB — MAGNESIUM: Magnesium: 2 mg/dL (ref 1.7–2.4)

## 2024-05-16 LAB — PHOSPHORUS: Phosphorus: 1.5 mg/dL — ABNORMAL LOW (ref 2.5–4.6)

## 2024-05-16 MED ORDER — SACCHAROMYCES BOULARDII 250 MG PO CAPS
250.0000 mg | ORAL_CAPSULE | Freq: Two times a day (BID) | ORAL | 0 refills | Status: AC
Start: 2024-05-16 — End: ?

## 2024-05-16 MED ORDER — LOPERAMIDE HCL 2 MG PO CAPS: 4.0000 mg | ORAL_CAPSULE | ORAL | 0 refills | Status: AC | PRN

## 2024-05-16 MED ORDER — SODIUM PHOSPHATES 45 MMOLE/15ML IV SOLN
30.0000 mmol | Freq: Once | INTRAVENOUS | Status: AC
  Administered 2024-05-16: 30 mmol via INTRAVENOUS
  Filled 2024-05-16: qty 10

## 2024-05-16 MED ORDER — LOPERAMIDE HCL 2 MG PO CAPS: 4.0000 mg | ORAL_CAPSULE | ORAL | Status: DC | PRN

## 2024-05-16 MED ORDER — SACCHAROMYCES BOULARDII 250 MG PO CAPS: 250.0000 mg | ORAL_CAPSULE | Freq: Two times a day (BID) | ORAL | Status: DC

## 2024-05-16 MED ORDER — ONDANSETRON 4 MG PO TBDP
4.0000 mg | ORAL_TABLET | Freq: Three times a day (TID) | ORAL | 0 refills | Status: AC | PRN
Start: 2024-05-16 — End: ?

## 2024-05-16 MED ORDER — SIMETHICONE 80 MG PO CHEW
80.0000 mg | CHEWABLE_TABLET | Freq: Four times a day (QID) | ORAL | Status: DC | PRN
  Administered 2024-05-16: 80 mg via ORAL
  Filled 2024-05-16: qty 1

## 2024-05-16 MED ORDER — SIMETHICONE 80 MG PO CHEW: 80.0000 mg | CHEWABLE_TABLET | Freq: Four times a day (QID) | ORAL | 0 refills | Status: AC | PRN

## 2024-05-16 NOTE — Progress Notes (Signed)
 Occupational Therapy Treatment Patient Details Name: Alexander Yoder MRN: [id number] DOB: Jun 02, 1930 Today's Date: 05/16/2024   History of present illness Pt is a 89 y/o M admitted on 05/13/24 after presenting with c/o N&V, diarrhea & abdominal discomfort. PT is being treated for norovirus. PMH: HLD, pituitary tumor with pan hypothyroidism, diabetes insipidus, prostate CA   OT comments  Pt. Seen for skilled OT treatment session.  Bed mobility with S.  In room ambulation with CGA, pt. Requesting use of RW today for balance due to reported weakness from being sick.  Transfer to recliner with CGA.  Able to demo LB dressing with set up.  Progressing well with goals.  Cont. With acute OT POC.        If plan is discharge home, recommend the following:  A little help with walking and/or transfers;A little help with bathing/dressing/bathroom;Assistance with cooking/housework;Assist for transportation;Help with stairs or ramp for entrance   Equipment Recommendations  None recommended by OT    Recommendations for Other Services      Precautions / Restrictions Precautions Precautions: Fall Precaution/Restrictions Comments: rectal tube       Mobility Bed Mobility Overal bed mobility: Needs Assistance Bed Mobility: Supine to Sit     Supine to sit: Supervision, HOB elevated, Used rails     General bed mobility comments: pt. reports he has an adjustable  bed at home    Transfers Overall transfer level: Needs assistance Equipment used: Rolling walker (2 wheels) Transfers: Sit to/from Stand, Bed to chair/wheelchair/BSC Sit to Stand: Min assist, Contact guard assist           General transfer comment: sit>stand from EOB, then transer around the room to the recliner     Balance                                           ADL either performed or assessed with clinical judgement   ADL Overall ADL's : Needs assistance/impaired                     Lower  Body Dressing: Set up;Sitting/lateral leans Lower Body Dressing Details (indicate cue type and reason): able to bring BLEs towards chest to reach feet.  reviewed this is preferred vs. seated/bending forward Toilet Transfer: Contact guard assist;Rolling walker (2 wheels) Toilet Transfer Details (indicate cue type and reason): simulated with in room ambulation and transfer to recliner (pt. declined need for use for urine, has rectal tube)         Functional mobility during ADLs: Contact guard assist;Rolling walker (2 wheels)      Extremity/Trunk Assessment              Vision       Perception     Praxis     Communication Communication Communication: Impaired Factors Affecting Communication: Hearing impaired   Cognition Arousal: Alert Behavior During Therapy: WFL for tasks assessed/performed Cognition: No apparent impairments                               Following commands: Intact        Cueing   Cueing Techniques: Verbal cues  Exercises      Shoulder Instructions       General Comments      Pertinent Vitals/ Pain  Pain Assessment Pain Assessment: Faces Faces Pain Scale: Hurts even more Pain Location: stomach Pain Descriptors / Indicators: Pressure, Sharp Pain Intervention(s): Repositioned, Monitored during session, Limited activity within patient's tolerance, Patient requesting pain meds-RN notified, Other (comment) (requesting gas-x)  Home Living                                          Prior Functioning/Environment              Frequency  Min 2X/week        Progress Toward Goals  OT Goals(current goals can now be found in the care plan section)  Progress towards OT goals: Progressing toward goals     Plan      Co-evaluation                 AM-PAC OT 6 Clicks Daily Activity     Outcome Measure   Help from another person eating meals?: None Help from another person taking care of  personal grooming?: A Little Help from another person toileting, which includes using toliet, bedpan, or urinal?: A Little Help from another person bathing (including washing, rinsing, drying)?: A Little Help from another person to put on and taking off regular upper body clothing?: A Little Help from another person to put on and taking off regular lower body clothing?: A Little 6 Click Score: 19    End of Session Equipment Utilized During Treatment: Rolling walker (2 wheels)  OT Visit Diagnosis: Unsteadiness on feet (R26.81);Other abnormalities of gait and mobility (R26.89);Muscle weakness (generalized) (M62.81)   Activity Tolerance Patient tolerated treatment well   Patient Left in chair;with call bell/phone within reach;with chair alarm set   Nurse Communication Other (comment);Mobility status (updated rn with session details and pt. request for gas-x)        Time: 1043-1055 OT Time Calculation (min): 12 min  Charges: OT General Charges $OT Visit: 1 Visit OT Treatments $Self Care/Home Management : 8-22 mins  Randall, COTA/L Acute Rehabilitation [phone]   CHRISTELLA Nest Lorraine-COTA/L  05/16/2024, 11:35 AM

## 2024-05-16 NOTE — TOC Transition Note (Signed)
 Transition of Care [facility]) - Discharge Note   Patient Details  Name: SILER MAVIS MRN: [id number] Date of Birth: [birth year]  [facility]) CM/SW Contact:  Robynn Eileen Hoose, RN Phone Number: 05/16/2024, 4:16 PM   Clinical Narrative:   Patient is being discharged today. Katina with [facility] made aware.    Final next level of care: Home w Home Health Services Barriers to Discharge: No Barriers Identified   Patient Goals and CMS Choice            Discharge Placement                       Discharge Plan and Services Additional resources added to the After Visit Summary for     Discharge Planning Services: CM Consult                      HH Arranged: PT, OT HH Agency: [facility] Date [facility] Agency Contacted: 05/15/24 Time HH Agency Contacted: 1110 Representative spoke with at [facility] Agency: [facility]  Social Drivers of Health (SDOH) Interventions SDOH Screenings   Food Insecurity: No Food Insecurity (05/13/2024)  Housing: Low Risk  (05/13/2024)  Transportation Needs: No Transportation Needs (05/13/2024)  Utilities: Not At Risk (05/13/2024)  Tobacco Use: Low Risk  (05/13/2024)     Readmission Risk Interventions    05/14/2024    4:02 PM  Readmission Risk Prevention Plan  Post Dischage Appt Complete  Medication Screening Complete  Transportation Screening Complete

## 2024-05-17 DIAGNOSIS — N179 Acute kidney failure, unspecified: Secondary | ICD-10-CM | POA: Diagnosis not present

## 2024-05-17 DIAGNOSIS — E871 Hypo-osmolality and hyponatremia: Secondary | ICD-10-CM | POA: Diagnosis not present

## 2024-05-17 DIAGNOSIS — E872 Acidosis, unspecified: Secondary | ICD-10-CM | POA: Diagnosis not present

## 2024-05-17 DIAGNOSIS — Z6824 Body mass index (BMI) 24.0-24.9, adult: Secondary | ICD-10-CM | POA: Diagnosis not present

## 2024-05-17 DIAGNOSIS — A0811 Acute gastroenteropathy due to Norwalk agent: Secondary | ICD-10-CM | POA: Diagnosis not present

## 2024-05-17 DIAGNOSIS — I6529 Occlusion and stenosis of unspecified carotid artery: Secondary | ICD-10-CM | POA: Diagnosis not present

## 2024-05-17 DIAGNOSIS — Z8546 Personal history of malignant neoplasm of prostate: Secondary | ICD-10-CM | POA: Diagnosis not present

## 2024-05-17 DIAGNOSIS — H409 Unspecified glaucoma: Secondary | ICD-10-CM | POA: Diagnosis not present

## 2024-05-17 DIAGNOSIS — E663 Overweight: Secondary | ICD-10-CM | POA: Diagnosis not present

## 2024-05-17 DIAGNOSIS — E039 Hypothyroidism, unspecified: Secondary | ICD-10-CM | POA: Diagnosis not present

## 2024-05-17 DIAGNOSIS — Z8616 Personal history of COVID-19: Secondary | ICD-10-CM | POA: Diagnosis not present

## 2024-05-17 DIAGNOSIS — E785 Hyperlipidemia, unspecified: Secondary | ICD-10-CM | POA: Diagnosis not present

## 2024-05-17 DIAGNOSIS — Z7902 Long term (current) use of antithrombotics/antiplatelets: Secondary | ICD-10-CM | POA: Diagnosis not present

## 2024-05-17 DIAGNOSIS — I5032 Chronic diastolic (congestive) heart failure: Secondary | ICD-10-CM | POA: Diagnosis not present

## 2024-05-17 DIAGNOSIS — Z9089 Acquired absence of other organs: Secondary | ICD-10-CM | POA: Diagnosis not present

## 2024-05-17 DIAGNOSIS — Z8673 Personal history of transient ischemic attack (TIA), and cerebral infarction without residual deficits: Secondary | ICD-10-CM | POA: Diagnosis not present

## 2024-05-17 DIAGNOSIS — D751 Secondary polycythemia: Secondary | ICD-10-CM | POA: Diagnosis not present

## 2024-05-17 DIAGNOSIS — F419 Anxiety disorder, unspecified: Secondary | ICD-10-CM | POA: Diagnosis not present

## 2024-05-17 DIAGNOSIS — R32 Unspecified urinary incontinence: Secondary | ICD-10-CM | POA: Diagnosis not present

## 2024-05-17 DIAGNOSIS — E2740 Unspecified adrenocortical insufficiency: Secondary | ICD-10-CM | POA: Diagnosis not present

## 2024-05-17 DIAGNOSIS — Z86018 Personal history of other benign neoplasm: Secondary | ICD-10-CM | POA: Diagnosis not present

## 2024-05-17 DIAGNOSIS — Z7982 Long term (current) use of aspirin: Secondary | ICD-10-CM | POA: Diagnosis not present

## 2024-05-17 DIAGNOSIS — Z9079 Acquired absence of other genital organ(s): Secondary | ICD-10-CM | POA: Diagnosis not present

## 2024-05-17 DIAGNOSIS — E8809 Other disorders of plasma-protein metabolism, not elsewhere classified: Secondary | ICD-10-CM | POA: Diagnosis not present

## 2024-05-18 LAB — CULTURE, BLOOD (ROUTINE X 2)
Culture: NO GROWTH
Culture: NO GROWTH

## 2024-05-18 NOTE — Discharge Summary (Signed)
 Physician Discharge Summary   Patient: Alexander Yoder MRN: [id number] DOB: [birth year]  Admit date:     05/13/2024  Discharge date: 05/16/2024  Discharge Physician: Alejandro Lazarus Marker   PCP: Charlott Dorn LABOR, MD   Recommendations at discharge:   Follow-up with PCP within 1 to 2 weeks repeat CBC, CMP, mag, Phos within 1 week Follow-up with endocrinology in outpatient setting  Discharge Diagnoses: Principal Problem:   Nausea, vomiting, and diarrhea Active Problems:   Nausea vomiting and diarrhea  Resolved Problems:   * No resolved hospital problems. *  Hospital Course: Alexander Yoder is a 89 y.o. 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  There is concern that he may have had some adrenal insufficiency so he was given a dose of hydrocortisone  100 mg.  Further workup reveals that he has norovirus and symptoms were improving yesterday however he was nauseous and felt a little worse today so we will continue IV fluid hydration for another 12 hours and give him another dose of IV hydrocortisone  100 mg x 1.  Will continue supportive care and replete electrolyte abnormalities.  He improved significantly and was deemed medically stable for discharge and will need to follow-up with PCP and endocrinology in outpatient setting.  Assessment and Plan:  N/V/Diarrhea: In the setting of Norovirus. Will D/C Abx. D-Dimer and CRP likely elevated in the setting of Infection from Noroviurs. Repeat in the AM. C/w Supprotive Care and IV fluids to be reduced to 75 mL/h for another 12 more hours.  Continue with antiemetics given the patient was nauseous today but has not today.  He is improved and significantly improved by the time of discharge.  Abdominal discomfort likely in setting of norovirus gastroenteritis.   Will go to clear liquid diet to full liquid diet to SOFT Diet.  Tolerating soft diet without issues and is stable for discharge  Lactic Acidosis: Lactic Acid Trend: Recent Labs  Lab 05/13/24 1103 05/13/24 1222 05/13/24 1717 05/14/24 1010 05/14/24 1300 05/14/24 2114 05/15/24 0016  LATICACIDVEN 2.5* 6.8* 2.1* 2.2* 2.3* 1.6 1.1  -Bolused yesterday afternoon and getting mIVF as above. CTM and Trend and repeaing LA and this is now improved.  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 -Continue levothyroxine , DDAVP , hydrocortisone  -Patient's presentation was concerning for mild to moderate adrenal insufficiency suspect he may not have taken stress dose during his illness and therefore we will start patient on Hydrocortisone  100 mg IV x 1 followed by his p.o. doses.  Will give him another 100 mg hydrocortisone  injection today given his nausea vomiting and drop in sodium and low potassium -TSH was 0.036 and free T4 was 0.91   Chronic Diastolic CHF: Last echocardiogram from 2018 noted EF to be 60 to 65% with grade 1 diastolic dysfunction at that time.  Patient appears to be dehydrated so will c/w IVF as above for another 12 hours.  Strict I's and O's and daily weights and will need to continue to monitor for signs and symptoms of volume overload  Hyponatremia/ Hypokalemia / Hypophosphatemia: Repelte w/ IV NaPhos 30 mmol and po KCL 40 mEQ BID.  CTM and Replete Electrolytes as Necessary. Repeat CMP, Mag, Phos in the AM.     Polycythemia:  Chronic. Would recommend patient to discontinue his testosterone  injections as it puts him at risk for hypercoagulability. Hgb/Hct Trend:  Recent Labs  Lab 05/13/24 1053  05/14/24 0541 05/15/24 0641 05/16/24 0723  HGB 19.9* 18.4* 16.6 17.1*  HCT 57.4* 53.6* 48.2 49.9  MCV 81.9 82.5 83.1 81.9  -CTM and Trend and repeat CMP within 1 week    AKI / Metabolic Acidosis: In setting of dehydration and prerenal causes from his GI losses BUN/Cr Trend: Recent Labs  Lab 05/13/24 1215 05/14/24 0541 05/15/24 0641 05/16/24 0723  BUN 17 19 10 8   CREATININE 1.31* 1.50* 0.97 0.81  -IVF as above.  Has slight metabolic acidosis with a CO2 of 19, anion gap of 10, chloride level 100 -Avoid Nephrotoxic Medications, Contrast Dyes, Hypotension and Dehydration to Ensure Adequate Renal Perfusion and will need to Renally Adjust Meds. CTM and Trend Renal Function carefully and repeat within 1 week  Hypophosphatemia: Replete with IV sodium Phos 30 mmol prior to discharge.  To monitor replete and repeat CMP within 1 week  Evaded Troponin: Likely demand ischemia in the setting of dehydration from his norovirus  H/O basilar artery / carotid artery stenosis/ H/O TIA/MCA stenosis: C/w Clopidogrel .    Hypogonadism: D/c Testosterone  for now.  PSA 0.27   Glaucoma: Cont eye drops. Cont alphagan  combigan  and xalatan .   Hypoalbuminemia: Patient's Albumin Level went from 3.2 -> 3.3 -> 3.1. CTM and Trend and repeat CMP in the AM  Overweight: Complicates overall prognosis and care. Estimated body mass index is 26.67 kg/m as calculated from the following:   Height as of this encounter: 5' 9.5 (1.765 m).   Weight as of this encounter: 83.1 kg. Weight Loss and Dietary Counseling given  Consultants: None Procedures performed: As delineated as above   Disposition: Home health Diet recommendation:  Discharge Diet Orders (From admission, onward)     Start     Ordered   05/16/24 0000  Diet - low sodium heart healthy       Comments: SOFT Diet   05/16/24 1608           Cardiac diet DISCHARGE MEDICATION: Allergies as of 05/16/2024       Reactions   Peanut-containing Drug  Products Anaphylaxis   Penicillins Other (See Comments)   Did it involve swelling of the face/tongue/throat, SOB, or low BP? Yes Did it involve sudden or severe rash/hives, skin peeling, or any reaction on the inside of your mouth or nose? Yes Did you need to seek medical attention at a hospital or doctor's office? Yes When did it last happen?     2010  If all above answers are "NO", may proceed with cephalosporin use.   Rosuvastatin Calcium  Other (See Comments)   Myalgia, leg pain        Medication List     STOP taking these medications    clindamycin  300 MG capsule Commonly known as: CLEOCIN        TAKE these medications    acetaminophen  325 MG tablet Commonly known as: TYLENOL  Take 2 tablets (650 mg total) by mouth every 6 (six) hours as needed for mild pain (or Fever >/= 101).   albuterol  108 (90 Base) MCG/ACT inhaler Commonly known as: VENTOLIN  HFA Inhale 2 puffs into the lungs every 6 (six) hours as needed for wheezing or shortness of breath.   aspirin  EC 81 MG tablet Take  81 mg by mouth daily.   B-complex with vitamin C tablet Take 1 tablet by mouth daily.   brimonidine  0.15 % ophthalmic solution Commonly known as: ALPHAGAN  Place 1 drop into the right eye 2 (two) times daily.   clopidogrel  75 MG tablet Commonly known as: PLAVIX  Take 75 mg by mouth daily.   Combigan  0.2-0.5 % ophthalmic solution Generic drug: brimonidine -timolol  Place 1 drop into the left eye 2 (two) times daily.   desmopressin  0.1 MG tablet Commonly known as: DDAVP  Take 1 tablet (0.1 mg total) by mouth 2 (two) times daily.   Fish Oil 1000 MG Caps Take 1,000 mg by mouth daily.   HYDROcodone -acetaminophen  7.5-325 MG tablet Commonly known as: NORCO Take 1 tablet by mouth every 6 (six) hours as needed for moderate pain (pain score 4-6) or severe pain (pain score 7-10).   hydrocortisone  10 MG tablet Commonly known as: CORTEF  Take  2 tablets (20mg ) by mouth in the morning, and 1 tablet  (10mg ) in the evening. What changed:  how much to take how to take this when to take this   latanoprost  0.005 % ophthalmic solution Commonly known as: XALATAN  Place 1 drop into both eyes at bedtime.   levothyroxine  112 MCG tablet Commonly known as: SYNTHROID  Take 112 mcg by mouth daily before breakfast. What changed: Another medication with the same name was removed. Continue taking this medication, and follow the directions you see here.   loperamide  2 MG capsule Commonly known as: IMODIUM  Take 2 capsules (4 mg total) by mouth as needed for diarrhea or loose stools.   ondansetron  4 MG disintegrating tablet Commonly known as: ZOFRAN -ODT Take 1 tablet (4 mg total) by mouth every 8 (eight) hours as needed for nausea or vomiting.   QC TUMERIC COMPLEX PO Take 1 capsule by mouth daily.   saccharomyces boulardii 250 MG capsule Commonly known as: FLORASTOR Take 1 capsule (250 mg total) by mouth 2 (two) times daily.   simethicone 80 MG chewable tablet Commonly known as: MYLICON Chew 1 tablet (80 mg total) by mouth every 6 (six) hours as needed for flatulence.   testosterone  cypionate 200 MG/ML injection Commonly known as: DEPOTESTOSTERONE CYPIONATE Inject 200 mg into the muscle every 14 (fourteen) days.   Vitamin D  50 MCG (2000 UT) Caps Take 2,000 Units by mouth daily.        Follow-up Information     [facility].   Specialty: Home Health Services Why: Someone will call you to schedule first home visit. Contact information: [address] [place] [address] [phone]                Discharge Exam: Filed Weights   05/13/24 0958 05/13/24 2030 05/15/24 0407  Weight: 79.8 kg 80.9 kg 83.1 kg   Vitals:   05/16/24 0758 05/16/24 1635  BP: (!) 160/86 (!) 159/83  Pulse: 64 (!) 57  Resp: 18   Temp: 98.3 F (36.8 C) 98.3 F (36.8 C)  SpO2: 99% 99%   Examination: Physical Exam:  Constitutional: Elderly chronically  ill-appearing Caucasian male in no acute distress Respiratory: Diminished to auscultation bilaterally, no wheezing, rales, rhonchi or crackles. Normal respiratory effort and patient is not tachypenic. No accessory muscle use.  Unlabored breathing Cardiovascular: RRR, no murmurs / rubs / gallops. S1 and S2 auscultated. No extremity edema.  Abdomen: Soft, non-tender, mildly distended secondary to body habitus. Bowel sounds positive.  GU: Deferred. Musculoskeletal: No clubbing / cyanosis of digits/nails. No  joint deformity upper and lower extremities.  Skin: No rashes, lesions, ulcers on limited skin evaluation. No induration; Warm and dry.  Neurologic: CN 2-12 grossly intact with no focal deficits. Romberg sign and cerebellar reflexes not assessed.  Psychiatric: Normal judgment and insight. Alert and oriented x 3. Normal mood and appropriate affect.   Condition at discharge: stable  The results of significant diagnostics from this hospitalization (including imaging, microbiology, ancillary and laboratory) are listed below for reference.   Imaging Studies: CT ABDOMEN PELVIS W CONTRAST Result Date: 05/13/2024 CLINICAL DATA:  Abdominal pain. EXAM: CT ABDOMEN AND PELVIS WITH CONTRAST TECHNIQUE: Multidetector CT imaging of the abdomen and pelvis was performed using the standard protocol following bolus administration of intravenous contrast. RADIATION DOSE REDUCTION: This exam was performed according to the departmental dose-optimization program which includes automated exposure control, adjustment of the mA and/or kV according to patient size and/or use of iterative reconstruction technique. CONTRAST:  75mL OMNIPAQUE  IOHEXOL  350 MG/ML SOLN COMPARISON:  CT dated 02/13/2020. FINDINGS: Lower chest: The visualized lung bases are clear. No intra-abdominal free air or free fluid. Hepatobiliary: The liver is unremarkable. No dilatation. Multiple gallstones. No pericholecystic fluid or evidence of acute  cholecystitis by CT. Pancreas: Unremarkable. No pancreatic ductal dilatation or surrounding inflammatory changes. Spleen: Normal in size without focal abnormality. Adrenals/Urinary Tract: The adrenal glands are unremarkable. There is no hydronephrosis on either side. There is symmetric enhancement and excretion of contrast by both kidneys. The visualized ureters and urinary bladder appear unremarkable. Stomach/Bowel: There is loose stool throughout the colon consistent with diarrheal state. There is no bowel obstruction or active inflammation. The appendix is normal. Vascular/Lymphatic: Mild aortoiliac atherosclerotic disease. The IVC is unremarkable. No portal venous gas. There is no adenopathy. Improved nonspecific upper mesentery stranding. Reproductive: Prostatectomy. Other: Midline vertical anterior pelvic wall incisional scar. Musculoskeletal: Osteopenia with degenerative changes. No acute osseous pathology. IMPRESSION: 1. Diarrheal state. Mesh in correlation with clinical exam and stool cultures recommended. No bowel obstruction. Normal appendix. 2. Cholelithiasis. 3.  Aortic Atherosclerosis (ICD10-I70.0). Electronically Signed   By: Vanetta Chou M.D.   On: 05/13/2024 14:54   Microbiology: Results for orders placed or performed during the hospital encounter of 05/13/24  Blood culture (routine x 2)     Status: None   Collection Time: 05/13/24 10:15 AM   Specimen: BLOOD  Result Value Ref Range Status   Specimen Description BLOOD SITE NOT SPECIFIED  Final   Special Requests   Final    BOTTLES DRAWN AEROBIC ONLY Blood Culture results may not be optimal due to an inadequate volume of blood received in culture bottles   Culture   Final    NO GROWTH 5 DAYS Performed at [facility], [address], [place], [place] [address]    Report Status 05/18/2024 FINAL  Final  C Difficile Quick Screen w PCR reflex     Status: None   Collection Time: 05/13/24 10:15 AM   Specimen: STOOL  Result Value  Ref Range Status   C Diff antigen NEGATIVE NEGATIVE Final   C Diff toxin NEGATIVE NEGATIVE Final   C Diff interpretation No C. difficile detected.  Final    Comment: Performed at [facility], [address], [place], [place] [address]  Resp panel by RT-PCR (RSV, Flu A&B, Covid) Anterior Nasal Swab     Status: None   Collection Time: 05/13/24 10:15 AM   Specimen: Anterior Nasal Swab  Result Value Ref Range Status   SARS  Coronavirus 2 by RT PCR NEGATIVE NEGATIVE Final   Influenza A by PCR NEGATIVE NEGATIVE Final   Influenza B by PCR NEGATIVE NEGATIVE Final    Comment: (NOTE) The Xpert Xpress SARS-CoV-2/FLU/RSV plus assay is intended as an aid in the diagnosis of influenza from Nasopharyngeal swab specimens and should not be used as a sole basis for treatment. Nasal washings and aspirates are unacceptable for Xpert Xpress SARS-CoV-2/FLU/RSV testing.  Fact Sheet for Patients: BloggerCourse.com  Fact Sheet for Healthcare Providers: SeriousBroker.it  This test is not yet approved or cleared by the [place]  FDA and has been authorized for detection and/or diagnosis of SARS-CoV-2 by FDA under an Emergency Use Authorization (EUA). This EUA will remain in effect (meaning this test can be used) for the duration of the COVID-19 declaration under Section 564(b)(1) of the Act, 21 U.S.C. section 360bbb-3(b)(1), unless the authorization is terminated or revoked.     Resp Syncytial Virus by PCR NEGATIVE NEGATIVE Final    Comment: (NOTE) Fact Sheet for Patients: BloggerCourse.com  Fact Sheet for Healthcare Providers: SeriousBroker.it  This test is not yet approved or cleared by the [place]  FDA and has been authorized for detection and/or diagnosis of SARS-CoV-2 by FDA under an Emergency Use Authorization (EUA). This EUA will remain in effect (meaning this test can be used)  for the duration of the COVID-19 declaration under Section 564(b)(1) of the Act, 21 U.S.C. section 360bbb-3(b)(1), unless the authorization is terminated or revoked.  Performed at [facility], [address] [place], [place] [address]   Blood culture (routine x 2)     Status: None   Collection Time: 05/13/24  5:05 PM   Specimen: BLOOD RIGHT HAND  Result Value Ref Range Status   Specimen Description BLOOD RIGHT HAND  Final   Special Requests   Final    BOTTLES DRAWN AEROBIC ONLY Blood Culture results may not be optimal due to an inadequate volume of blood received in culture bottles   Culture   Final    NO GROWTH 5 DAYS Performed at [facility], [address], [place], [place] [address]    Report Status 05/18/2024 FINAL  Final  Gastrointestinal Panel by PCR , Stool     Status: Abnormal   Collection Time: 05/14/24  9:56 AM   Specimen: Stool  Result Value Ref Range Status   Campylobacter species NOT DETECTED NOT DETECTED Final   Plesimonas shigelloides NOT DETECTED NOT DETECTED Final   Salmonella species NOT DETECTED NOT DETECTED Final   Yersinia enterocolitica NOT DETECTED NOT DETECTED Final   Vibrio species NOT DETECTED NOT DETECTED Final   Vibrio cholerae NOT DETECTED NOT DETECTED Final   Enteroaggregative E coli (EAEC) NOT DETECTED NOT DETECTED Final   Enteropathogenic E coli (EPEC) NOT DETECTED NOT DETECTED Final   Enterotoxigenic E coli (ETEC) NOT DETECTED NOT DETECTED Final   Shiga like toxin producing E coli (STEC) NOT DETECTED NOT DETECTED Final   Shigella/Enteroinvasive E coli (EIEC) NOT DETECTED NOT DETECTED Final   Cryptosporidium NOT DETECTED NOT DETECTED Final   Cyclospora cayetanensis NOT DETECTED NOT DETECTED Final   Entamoeba histolytica NOT DETECTED NOT DETECTED Final   Giardia lamblia NOT DETECTED NOT DETECTED Final   Adenovirus F40/41 NOT DETECTED NOT DETECTED Final   Astrovirus NOT DETECTED NOT DETECTED Final   Norovirus GI/GII DETECTED (A)  NOT DETECTED Final    Comment: RCRV  AMBER L., RN AT 1343 05/14/24    Rotavirus A NOT DETECTED NOT  DETECTED Final   Sapovirus (I, II, IV, and V) NOT DETECTED NOT DETECTED Final    Comment: Performed at [facility], [address] [place], [place] [address]   Labs: CBC: Recent Labs  Lab 05/13/24 1053 05/14/24 0541 05/15/24 0641 05/16/24 0723  WBC 9.4 7.5 8.5 8.5  NEUTROABS 7.3  --  6.0 6.1  HGB 19.9* 18.4* 16.6 17.1*  HCT 57.4* 53.6* 48.2 49.9  MCV 81.9 82.5 83.1 81.9  PLT 283 223 196 218   Basic Metabolic Panel: Recent Labs  Lab 05/13/24 1215 05/13/24 2110 05/14/24 0541 05/15/24 0641 05/16/24 0723  NA 135  --  133* 128* 129*  K 4.2  --  4.0 3.3* 4.0  CL 101  --  105 97* 100  CO2 20*  --  17* 22 19*  GLUCOSE 68*  --  117* 87 84  BUN 17  --  19 10 8   CREATININE 1.31*  --  1.50* 0.97 0.81  CALCIUM  8.2*  --  8.0* 7.7* 8.2*  MG  --  1.8 1.8 1.9 2.0  PHOS  --   --   --  1.7* 1.5*   Liver Function Tests: Recent Labs  Lab 05/13/24 1215 05/14/24 0541 05/15/24 0641 05/16/24 0723  AST 36 41 54* 63*  ALT 31 31 30  32  ALKPHOS 47 51 44 51  BILITOT 0.7 0.9 0.5 0.8  PROT 5.7* 6.1* 5.7* 6.4*  ALBUMIN 3.2* 3.3* 3.1* 3.4*   CBG: Recent Labs  Lab 05/15/24 2359 05/16/24 0550 05/16/24 0757 05/16/24 1143 05/16/24 1632  GLUCAP 112* 94 111* 103* 116*   Discharge time spent: greater than 30 minutes.  Signed: Alejandro Marker, DO [facility] 05/19/2024

## 2024-05-19 ENCOUNTER — Encounter

## 2024-05-25 DIAGNOSIS — I5032 Chronic diastolic (congestive) heart failure: Secondary | ICD-10-CM | POA: Diagnosis not present

## 2024-05-25 DIAGNOSIS — E039 Hypothyroidism, unspecified: Secondary | ICD-10-CM | POA: Diagnosis not present

## 2024-05-25 DIAGNOSIS — A0811 Acute gastroenteropathy due to Norwalk agent: Secondary | ICD-10-CM | POA: Diagnosis not present

## 2024-05-25 DIAGNOSIS — N179 Acute kidney failure, unspecified: Secondary | ICD-10-CM | POA: Diagnosis not present

## 2024-05-25 DIAGNOSIS — I6529 Occlusion and stenosis of unspecified carotid artery: Secondary | ICD-10-CM | POA: Diagnosis not present

## 2024-05-25 DIAGNOSIS — E2740 Unspecified adrenocortical insufficiency: Secondary | ICD-10-CM | POA: Diagnosis not present

## 2024-05-26 ENCOUNTER — Encounter

## 2024-05-27 DIAGNOSIS — N179 Acute kidney failure, unspecified: Secondary | ICD-10-CM | POA: Diagnosis not present

## 2024-05-27 DIAGNOSIS — A0811 Acute gastroenteropathy due to Norwalk agent: Secondary | ICD-10-CM | POA: Diagnosis not present

## 2024-05-27 DIAGNOSIS — E2740 Unspecified adrenocortical insufficiency: Secondary | ICD-10-CM | POA: Diagnosis not present

## 2024-05-27 DIAGNOSIS — I6529 Occlusion and stenosis of unspecified carotid artery: Secondary | ICD-10-CM | POA: Diagnosis not present

## 2024-05-27 DIAGNOSIS — I5032 Chronic diastolic (congestive) heart failure: Secondary | ICD-10-CM | POA: Diagnosis not present

## 2024-05-27 DIAGNOSIS — E039 Hypothyroidism, unspecified: Secondary | ICD-10-CM | POA: Diagnosis not present

## 2024-06-17 DIAGNOSIS — Z961 Presence of intraocular lens: Secondary | ICD-10-CM | POA: Diagnosis not present

## 2024-06-18 ENCOUNTER — Ambulatory Visit (INDEPENDENT_AMBULATORY_CARE_PROVIDER_SITE_OTHER): Admitting: Podiatry

## 2024-06-18 ENCOUNTER — Encounter: Payer: Self-pay | Admitting: Podiatry

## 2024-06-18 DIAGNOSIS — M79676 Pain in unspecified toe(s): Secondary | ICD-10-CM | POA: Diagnosis not present

## 2024-06-18 DIAGNOSIS — B351 Tinea unguium: Secondary | ICD-10-CM | POA: Diagnosis not present

## 2024-06-18 NOTE — Progress Notes (Signed)
 He presents today chief complaint of painful elongated toenails hallux nails really been bothering him for the past 6 to 8 weeks.  He says they do seem to be gradually getting worse.  Objective: Vital signs are stable he is alert oriented x 3.  Pulses are palpable.  Toenails 1 through 5 bilateral are thick yellow dystrophic like mycotic painful palpation as well as debridement hallux nails are particularly worse with subungual debris and malodor.  They are significantly darker.  Assessment: Pain in limb secondary to onychomycosis bilateral.  Plan: Debridement of toenails 1 through 5 bilateral covered service secondary to pain follow-up with him in about 6 months to help maintain thickness.

## 2024-07-08 DIAGNOSIS — R42 Dizziness and giddiness: Secondary | ICD-10-CM | POA: Diagnosis not present

## 2024-07-08 DIAGNOSIS — H6122 Impacted cerumen, left ear: Secondary | ICD-10-CM | POA: Diagnosis not present

## 2024-07-29 ENCOUNTER — Encounter (HOSPITAL_COMMUNITY): Payer: Self-pay

## 2024-07-29 ENCOUNTER — Emergency Department (HOSPITAL_COMMUNITY)

## 2024-07-29 ENCOUNTER — Emergency Department (HOSPITAL_COMMUNITY)
Admission: EM | Admit: 2024-07-29 | Discharge: 2024-07-29 | Disposition: A | Discharge status: Home / Self Care | Attending: Emergency Medicine | Admitting: Emergency Medicine

## 2024-07-29 DIAGNOSIS — M25572 Pain in left ankle and joints of left foot: Secondary | ICD-10-CM | POA: Diagnosis not present

## 2024-07-29 DIAGNOSIS — W19XXXA Unspecified fall, initial encounter: Secondary | ICD-10-CM | POA: Diagnosis not present

## 2024-07-29 DIAGNOSIS — W11XXXA Fall on and from ladder, initial encounter: Secondary | ICD-10-CM | POA: Diagnosis not present

## 2024-07-29 DIAGNOSIS — R600 Localized edema: Secondary | ICD-10-CM | POA: Diagnosis not present

## 2024-07-29 DIAGNOSIS — Z79899 Other long term (current) drug therapy: Secondary | ICD-10-CM | POA: Insufficient documentation

## 2024-07-29 DIAGNOSIS — M1611 Unilateral primary osteoarthritis, right hip: Secondary | ICD-10-CM | POA: Diagnosis not present

## 2024-07-29 DIAGNOSIS — T148XXA Other injury of unspecified body region, initial encounter: Secondary | ICD-10-CM

## 2024-07-29 DIAGNOSIS — Z7902 Long term (current) use of antithrombotics/antiplatelets: Secondary | ICD-10-CM | POA: Insufficient documentation

## 2024-07-29 DIAGNOSIS — M25551 Pain in right hip: Secondary | ICD-10-CM | POA: Diagnosis not present

## 2024-07-29 DIAGNOSIS — Z8546 Personal history of malignant neoplasm of prostate: Secondary | ICD-10-CM | POA: Insufficient documentation

## 2024-07-29 DIAGNOSIS — Z91010 Allergy to peanuts: Secondary | ICD-10-CM | POA: Insufficient documentation

## 2024-07-29 DIAGNOSIS — M160 Bilateral primary osteoarthritis of hip: Secondary | ICD-10-CM | POA: Diagnosis not present

## 2024-07-29 DIAGNOSIS — S7001XA Contusion of right hip, initial encounter: Secondary | ICD-10-CM | POA: Diagnosis not present

## 2024-07-29 DIAGNOSIS — S79911A Unspecified injury of right hip, initial encounter: Secondary | ICD-10-CM | POA: Diagnosis present

## 2024-07-29 DIAGNOSIS — E039 Hypothyroidism, unspecified: Secondary | ICD-10-CM | POA: Insufficient documentation

## 2024-07-29 MED ORDER — ACETAMINOPHEN 325 MG PO TABS
650.0000 mg | ORAL_TABLET | Freq: Once | ORAL | Status: AC
  Administered 2024-07-29: 650 mg via ORAL
  Filled 2024-07-29: qty 2

## 2024-07-29 NOTE — ED Notes (Signed)
 Patient transported to X-ray

## 2024-07-29 NOTE — ED Notes (Signed)
 PT verbalized discharge instructions and was able to repeat it back to. Wheelchair used to get him to the lobby.

## 2024-07-29 NOTE — ED Triage Notes (Addendum)
 Per EMS, Pt, from home, c/o R hip pain r/t a mechanical fall this morning.  Pt reports he was on a 2 step ladder, went to step down thinking he was on the bottom step, and he was actually on the top step.  Pt has been able to ambulate since the fall.

## 2024-07-29 NOTE — ED Provider Notes (Signed)
 [place] EMERGENCY DEPARTMENT AT [facility] Provider Note   CSN: [id number] Arrival date & time: 07/29/24  1608     Patient presents with: Fall and Hip Pain   Alexander Yoder is a 89 y.o. male.   Pt is a 89 yo male with pmhx significant for glaucoma, prostate cancer, hx pituitary tumor, hld, hypothyroidism, and anxiety.  Pt said he was up on a 2 step step-ladder and thought he was on the 1st step and was on the top step.  Around 1100, he stepped down thinking it was just a short way, but stepped down from the upper step.  He fell on his right hip.  He denies any other injuries.  He was able to walk initially, but now the pain has worsened.  He did not hit his head.  No loc.       Prior to Admission medications   Medication Sig Start Date End Date Taking? Authorizing Provider  acetaminophen  (TYLENOL ) 325 MG tablet Take 2 tablets (650 mg total) by mouth every 6 (six) hours as needed for mild pain (or Fever >/= 101). 09/10/22   [name], Brayton RAMAN, MD  albuterol  (VENTOLIN  HFA) 108 (90 Base) MCG/ACT inhaler Inhale 2 puffs into the lungs every 6 (six) hours as needed for wheezing or shortness of breath. 01/21/23   Dennise Lavada POUR, MD  aspirin  EC 81 MG tablet Take 81 mg by mouth daily.    [provider]  B Complex-C (B-COMPLEX WITH VITAMIN C) tablet Take 1 tablet by mouth daily.    [provider]  brimonidine  (ALPHAGAN ) 0.15 % ophthalmic solution Place 1 drop into the right eye 2 (two) times daily.    [provider]  Cholecalciferol  (VITAMIN D ) 50 MCG (2000 UT) CAPS Take 2,000 Units by mouth daily.    [provider]  clopidogrel  (PLAVIX ) 75 MG tablet Take 75 mg by mouth daily. 03/29/21   [provider]  COMBIGAN  0.2-0.5 % ophthalmic solution Place 1 drop into the left eye 2 (two) times daily. 08/20/22   [provider]  desmopressin  (DDAVP ) 0.1 MG tablet Take 1 tablet (0.1 mg total) by mouth 2 (two) times daily. 09/10/22    [name], Brayton RAMAN, MD  HYDROcodone -acetaminophen  (NORCO) 7.5-325 MG tablet Take 1 tablet by mouth every 6 (six) hours as needed for moderate pain (pain score 4-6) or severe pain (pain score 7-10). 05/11/24   [provider]  hydrocortisone  (CORTEF ) 10 MG tablet Take  2 tablets (20mg ) by mouth in the morning, and 1 tablet (10mg ) in the evening. Patient taking differently: Take 10-20 mg by mouth daily. Take  2 tablets (20mg ) by mouth in the morning, and 1 tablet (10mg ) in the evening. 01/27/23   Singh, Prashant K, MD  latanoprost  (XALATAN ) 0.005 % ophthalmic solution Place 1 drop into both eyes at bedtime.    [provider]  levothyroxine  (SYNTHROID ) 112 MCG tablet Take 112 mcg by mouth daily before breakfast.    [provider]  loperamide  (IMODIUM ) 2 MG capsule Take 2 capsules (4 mg total) by mouth as needed for diarrhea or loose stools. 05/16/24   Sheikh, Omair Latif, DO  Omega-3 Fatty Acids (FISH OIL) 1000 MG CAPS Take 1,000 mg by mouth daily.    [provider]  ondansetron  (ZOFRAN -ODT) 4 MG disintegrating tablet Take 1 tablet (4 mg total) by mouth every 8 (eight) hours as needed for nausea or vomiting. 05/16/24   Sheikh, Omair Latif, DO  saccharomyces boulardii (  FLORASTOR) 250 MG capsule Take 1 capsule (250 mg total) by mouth 2 (two) times daily. 05/16/24   Sherrill Cable Latif, DO  simethicone  (MYLICON) 80 MG chewable tablet Chew 1 tablet (80 mg total) by mouth every 6 (six) hours as needed for flatulence. 05/16/24   Sherrill Cable Latif, DO  testosterone  cypionate (DEPOTESTOSTERONE CYPIONATE) 200 MG/ML injection Inject 200 mg into the muscle every 14 (fourteen) days.    [provider]  Turmeric (QC TUMERIC COMPLEX PO) Take 1 capsule by mouth daily.    [provider]    Allergies: Peanut-containing drug products, Penicillins, and Rosuvastatin calcium     Review of Systems  Musculoskeletal:        Right hip pain  All other systems reviewed and  are negative.   Updated Vital Signs BP (!) 172/78   Pulse 66   Temp 97.7 F (36.5 C) (Oral)   Resp (!) 23   Ht 5' 9 (1.753 m)   Wt 83 kg   SpO2 100%   BMI 27.02 kg/m   Physical Exam Vitals and nursing note reviewed.  Constitutional:      Appearance: Normal appearance.  HENT:     Head: Normocephalic and atraumatic.     Right Ear: External ear normal.     Left Ear: External ear normal.     Nose: Nose normal.     Mouth/Throat:     Mouth: Mucous membranes are moist.     Pharynx: Oropharynx is clear.  Eyes:     Extraocular Movements: Extraocular movements intact.     Conjunctiva/sclera: Conjunctivae normal.     Pupils: Pupils are equal, round, and reactive to light.  Cardiovascular:     Rate and Rhythm: Normal rate and regular rhythm.     Pulses: Normal pulses.     Heart sounds: Normal heart sounds.  Pulmonary:     Effort: Pulmonary effort is normal.     Breath sounds: Normal breath sounds.  Abdominal:     General: Abdomen is flat. Bowel sounds are normal.     Palpations: Abdomen is soft.  Musculoskeletal:        General: Normal range of motion.     Cervical back: Normal range of motion and neck supple.       Legs:  Skin:    General: Skin is warm.     Capillary Refill: Capillary refill takes less than 2 seconds.  Neurological:     General: No focal deficit present.     Mental Status: He is alert and oriented to person, place, and time.  Psychiatric:        Mood and Affect: Mood normal.        Behavior: Behavior normal.     (all labs ordered are listed, but only abnormal results are displayed) Labs Reviewed - No data to display  EKG: None  Radiology: CT Hip Right Wo Contrast Result Date: 07/29/2024 CLINICAL DATA:  Clemens, right hip pain EXAM: CT OF THE RIGHT HIP WITHOUT CONTRAST TECHNIQUE: Multidetector CT imaging of the right hip was performed according to the standard protocol. Multiplanar CT image reconstructions were also generated. RADIATION DOSE  REDUCTION: This exam was performed according to the departmental dose-optimization program which includes automated exposure control, adjustment of the mA and/or kV according to patient size and/or use of iterative reconstruction technique. COMPARISON:  07/29/2024 FINDINGS: Bones/Joint/Cartilage No acute displaced fracture. Mild right hip joint space narrowing and osteophyte formation compatible with osteoarthritis. No evidence of joint effusion. Ligaments Suboptimally  assessed by CT. Muscles and Tendons There is a rounded area of increased density within the right gluteus maximus muscle medially posterior to the proximal right femur, measuring 7.5 x 5.1 cm in transverse direction and extending proximally 7.9 cm in craniocaudal extent, consistent with intramuscular hematoma. Reference image 52/5 and 61/7. Soft tissues There is subcutaneous edema within the right gluteal region overlying the intramuscular hematoma. This likely reflects result of contusion and direct trauma. Remaining soft tissues are unremarkable. Reconstructed images demonstrate no additional findings. IMPRESSION: 1. Large intramuscular hematoma within the right gluteus maximus muscle, with overlying subcutaneous edema consistent with contusion. 2. No acute displaced fracture. 3. Mild right hip osteoarthritis. Electronically Signed   By: Ozell Daring M.D.   On: 07/29/2024 18:12   DG Hip Unilat W or Wo Pelvis 2-3 Views Right Result Date: 07/29/2024 CLINICAL DATA:  Clemens, hip pain EXAM: DG HIP (WITH OR WITHOUT PELVIS) 2-3V RIGHT COMPARISON:  05/31/2021 FINDINGS: Frontal view of the pelvis as well as frontal and frogleg lateral views of the right hip are obtained no acute fracture, subluxation, or dislocation. Mild symmetrical bilateral hip osteoarthritis. Sacroiliac joints are unremarkable. Multiple surgical clips from prior prostatectomy. IMPRESSION: 1. Symmetrical bilateral hip osteoarthritis. No acute displaced fracture. Electronically Signed    By: Ozell Daring M.D.   On: 07/29/2024 17:18     Procedures   Medications Ordered in the ED  acetaminophen  (TYLENOL ) tablet 650 mg (650 mg Oral Given 07/29/24 1813)                                    Medical Decision Making Amount and/or Complexity of Data Reviewed Radiology: ordered.  Risk OTC drugs.   This patient presents to the ED for concern of fall, this involves an extensive number of treatment options, and is a complaint that carries with it a high risk of complications and morbidity.  The differential diagnosis includes hip fx, hip contusion, pelvic fx   Co morbidities that complicate the patient evaluation   glaucoma, prostate cancer, hx pituitary tumor, hld, hypothyroidism, and anxiety   Additional history obtained:  Additional history obtained from epic chart review External records from outside source obtained and reviewed including EMS report  Imaging Studies ordered:  I ordered imaging studies including xray hip, ct hip  I independently visualized and interpreted imaging which showed  R hip xr: Symmetrical bilateral hip osteoarthritis. No acute displaced  fracture.  CT hip: Large intramuscular hematoma within the right gluteus maximus  muscle, with overlying subcutaneous edema consistent with contusion.  2. No acute displaced fracture.  3. Mild right hip osteoarthritis.   I agree with the radiologist interpretation   Medicines ordered and prescription drug management:  I ordered medication including tylenol   for pain  Reevaluation of the patient after these medicines showed that the patient improved I have reviewed the patients home medicines and have made adjustments as needed   Test Considered:  ct   Problem List / ED Course:  Right intramuscular hematoma within right gluteus maximus muscle.  Pt is able to ambulate.  He is to hold the plavix  and asa tomorrow.  He is to return if worse.    Reevaluation:  After the interventions  noted above, I reevaluated the patient and found that they have :improved   Social Determinants of Health:  Lives at home   Dispostion:  After consideration of the diagnostic results and the  patients response to treatment, I feel that the patent would benefit from discharge with outpatient f/u.       Final diagnoses:  Fall, initial encounter  Intramuscular hematoma    ED Discharge Orders     None          Dean Clarity, MD 07/29/24 1820

## 2024-07-29 NOTE — Discharge Instructions (Signed)
 Do not take your Plavix  or Aspirin  tomorrow mornign.

## 2024-08-02 NOTE — Progress Notes (Signed)
 Ophthalmology Department Clinical Visit Note     CHIEF COMPLAINT Patient presents for Follow Up Exam (Vision is about the same. He is happy to be able to read and generally see okay driving both with and without glasses. He can see long distance the same with and without his glasses, but he definitely needs them still to read up close. /Taking: /Brimonidine  x2 OD/Brimonidine -timolol  x2 OS/Latanoprost  x1 OU/)   HISTORY OF PRESENT ILLNESS: Alexander Thau Sr. is a 89 y.o. old male who presents to the clinic today for:   HPI     Follow Up Exam   Follow-Up For:: Primary open angle glaucoma (POAG).  In both eyes. Additional comments: Vision is about the same. He is happy to be able to read and generally see okay driving both with and without glasses. He can see long distance the same with and without his glasses, but he definitely needs them still to read up close.  Taking:  Brimonidine  x2 OD Brimonidine -timolol  x2 OS Latanoprost  x1 OU       Last edited by Chiquita JINNY Severe, OA on 08/03/2024  1:29 PM.     First seen by bb 08/28/2023 @Dr . Camillo on 02/04/2023; nerve 1.0, 0.9 Pituitary removal 1998 Strabismus surgery Dr. Dyke 2021 2010 recent MRI clean IOP usually running mid to low teens over past 4 years  Humphrey visual field interpretation  08/03/2024: 30-2 Good reliability OS OS: Inferior altitudinal defect and superior temporal defect-VFI 44; MD -18.6  OCT RNFL -  08/03/2024 Reliability:  Good, QI  27 OD, QI  28 OS OD:  Loss of double-hump pattern on profile view; sup avg 64, inf avg 52 OS:  Loss of double-hump pattern on profile view; sup avg 17, inf avg 83  CURRENT MEDICATIONS: Current Outpatient Medications on File Prior to Visit (Ophthalmic Drugs)  Medication Sig  . brimonidine  (ALPHAGAN ) 0.15 % ophthalmic solution Administer 1 drop into the right eye 2 (two) times a day.  . brimonidine -timoloL  (COMBIGAN ) 0.2-0.5 % ophthalmic solution Administer 1 drop into left  eye 2 (two) times a day.  . latanoprost  (XALATAN ) 0.005 % ophthalmic solution Administer 1 drop into each eyes at bedtime.   Current Outpatient Medications on File Prior to Visit (Other)  Medication Sig  . acetaminophen  (TYLENOL ) 500 mg tablet Take 500 mg by mouth.  . aspirin  81 mg EC tablet Take 81 mg by mouth Once Daily.  . B-complex with vitamin C tablet Take 1 tablet by mouth Once Daily.  . cholecalciferol  (VITAMIN D3) 2,000 unit cap capsule Take 2,000 Units by mouth Once Daily.  . clopidogreL  (PLAVIX ) 75 mg tablet Take 1 tablet by mouth Once Daily.  . DESMopressin  (DDAVP ) 0.1 mg tablet Take 0.1 mg by mouth 2 (two) times a day.  . docosahexaenoic acid-epa (Fish OiL) 120-180 mg cap Take 1,000 mg by mouth Once Daily.  . hydrocortisone  (CORTEF ) 10 mg tablet Take 10 mg by mouth every morning.  . hydrocortisone  (CORTEF ) 10 mg tablet Take 5 mg by mouth every evening.  . levothyroxine  (SYNTHROID ) 112 mcg tablet Take 112 mcg by mouth Once Daily.  . testosterone  cypionate (DEPO-TESTOSTERONE ) 200 mg/mL injection Inject 0.8 mL into the muscle every 14 (fourteen) days.    Referring physician: No referring provider defined for this encounter.  ALLERGIES Allergies  Allergen Reactions  . Penicillins Hives, Other (See Comments) and Itching    Did it involve swelling of the face/tongue/throat, SOB, or low BP? Yes, Did it involve sudden or severe rash/hives,  skin peeling, or any reaction on the inside of your mouth or nose? Yes, Did you need to seek medical attention at a hospital or doctor's office? Yes, When did it last happen?     2010 , If all above answers are "NO", may proceed with cephalosporin use., Other Reaction: hyperthermia  . Rosuvastatin Calcium      Other Reaction(s): leg pains  . Sulfa (Sulfonamide Antibiotics) Itching    PAST MEDICAL HISTORY Past Medical History:  Diagnosis Date  . Glaucoma   . Hyperlipidemia    History reviewed. No pertinent surgical history.  FAMILY  HISTORY No family history on file.  SOCIAL HISTORY Social History   Tobacco Use  . Smoking status: Never  Vaping Use  . Vaping status: Never Used        OPHTHALMIC EXAM:  Base Eye Exam     Visual Acuity (Snellen - Linear)       Right Left   Dist cc 20/150 +1 20/30   Dist ph cc 20/100 -1     Correction: Glasses         Tonometry (Applanation, 1:50 PM)       Right Left   Pressure 12 14         Neuro/Psych     Oriented x3: Yes   Mood/Affect: Normal           Slit Lamp and Fundus Exam     Slit Lamp Exam       Right Left   Lids/Lashes Normal Normal   Conjunctiva/Sclera White and quiet White and quiet   Cornea Clear Clear   Anterior Chamber Deep and quiet Deep and quiet   Iris Round and reactive Round and reactive   Lens PC IOL PC IOL         Fundus Exam       Right Left   Disc some nasal rim remaining, 3+ pallor, no heme PPA some nasal rim remaining, 2+ pallor, PPA   C/D Ratio 0.9 0.9            IMAGING AND PROCEDURES:  @EYRES @     ASSESSMENT:  1. Primary open angle glaucoma (POAG) of both eyes, severe stage  Humphrey Visual Field - OS - Left Eye   OCT RNFL - OU - Both Eyes     Patient is tolerating and taking medication as prescribed.  IOP good OU Continue drops the same Recheck in 5 months   PLAN:  Brimonidine  x2 OD Brimonidine -timolol  x2 OS Latanoprost  x1 OU Reviewed proper drop installation technique Return in about 5 months (around 01/03/2025).  Ophthalmic Meds Ordered this visit:  There were no meds ordered this visit.      Patient Instructions  DAILY DRUG REMINDER  Wait 10 minutes between drops!!! (Keep eyes closed for 2 MINUTES after each drop)   Drug EYE R = Right L = Left B = Both Breakfast Lunch Supper Bedtime Time Frame  Brimonidine  *Alphagan  P* (Purple top)  R X  X    Brimonidine -Timolol  *Combigan * (Dark blue top)  L X  X      Latanoprost  (Teal green top)  B    X           HOW TO  TAKE EYEDROPS 1.   Make sure you understand when, and exactly how often, to take your eyedrop medicine.  Preferably, learn the names of your medicines, or at least be able to identify your eyedrops by the color of the cap of the bottle (  e.g., "I take the purple top 2 times a day in the right eye and the yellow top 1 time a day in both eyes."). 2.  Always wash your hands prior to instilling your eyedrops. 3.  If you are putting drops in both eyes, then sit down or lie down to put in your drops.  This is because it is important to close the eye getting drops for 2 minutes immediately after instilling the drop (this markedly improves absorption).  If you are putting the drops in both eyes, then you will be closing both eyes for at least 2 minutes, therefore you should be sitting or lying down.  If you must stand to put in the drops (need to look in mirror), then have a chair next to you so that you can sit just after instilling the drops and close your eyes for 2 minutes.  If you are only instilling an eyedrop in one eye, then you only have to close that eye after the drop, and therefore you don't necessarily have to sit or lie. 4.  If you are taking more than one drop at a time in the same eye, then you must separate the drops by at least 10 minutes. 5.  To put in the drop, hold the bottle in your dominant hand between your forefinger and thumb and invert the bottle over the targeted eye.  Your head should be tilted back and you should roll your eyes upward. 6.  With your other hand, pull the lower eyelid down and try to instill the drop in the "pouch" made by pulling the lid down, or on the lower part of the eye ball.  Position the bottle close to the eye, but do not touch the eye with the dropper tip.  Squeeze the sides of the bottle and a drop of the medicine will come out. 7.  You will feel the drop when it contacts the eye, and then you should immediately close the eye, or if you are putting the drop in  both eyes, move your hands immediately to the other eye, and place the drop in it, and then close both eyes as recommended in #2 above. 8.  While the eye/s are closed, use a tissue to wipe away any medication that remains on the lid or lashes.  You may do this during the 2 minutes that the eye remains closed, just do not open the eye to wipe it. 9.  Always remember to put the cap back on the bottle and store the bottle away from pets and not in direct sunlight.  The bottle does not have to be refrigerated, but it may be if that is preferred (some people prefer feeling the cooled eyedrop on the eye). 10.  Never stop the drops without conferring with your doctor first, even if you feel that the drops are not helping your eye.  If you believe you are experiencing side-effects from the eyedrops, then discuss that, or any issues or concerns,  with your doctor.      Explained the diagnoses, plan, and follow up with the patient and they expressed understanding.  Patient expressed understanding of the importance of proper follow up care.    This document serves as a record of services personally performed by J. Thresa Bracket. It was created on their behalf by My Rayleen, a trained medical scribe. The creation of this record is the provider's dictation and/or activities during the visit.  I agree the documentation is  accurate and complete.  Electronically signed by: JINNY Thresa Bracket, MD 08/04/2024 8:44 PM

## 2024-08-03 DIAGNOSIS — H401133 Primary open-angle glaucoma, bilateral, severe stage: Secondary | ICD-10-CM | POA: Diagnosis not present

## 2024-08-17 DIAGNOSIS — Z23 Encounter for immunization: Secondary | ICD-10-CM | POA: Diagnosis not present

## 2024-09-17 ENCOUNTER — Emergency Department (HOSPITAL_COMMUNITY)

## 2024-09-17 ENCOUNTER — Encounter (HOSPITAL_COMMUNITY): Payer: Self-pay

## 2024-09-17 ENCOUNTER — Emergency Department (HOSPITAL_COMMUNITY)
Admission: EM | Admit: 2024-09-17 | Discharge: 2024-09-17 | Disposition: A | Discharge status: Home / Self Care | Attending: Emergency Medicine | Admitting: Emergency Medicine

## 2024-09-17 DIAGNOSIS — R110 Nausea: Secondary | ICD-10-CM | POA: Insufficient documentation

## 2024-09-17 DIAGNOSIS — E785 Hyperlipidemia, unspecified: Secondary | ICD-10-CM | POA: Insufficient documentation

## 2024-09-17 DIAGNOSIS — K122 Cellulitis and abscess of mouth: Secondary | ICD-10-CM | POA: Diagnosis not present

## 2024-09-17 DIAGNOSIS — H409 Unspecified glaucoma: Secondary | ICD-10-CM | POA: Insufficient documentation

## 2024-09-17 DIAGNOSIS — Z7982 Long term (current) use of aspirin: Secondary | ICD-10-CM | POA: Diagnosis not present

## 2024-09-17 DIAGNOSIS — E232 Diabetes insipidus: Secondary | ICD-10-CM | POA: Insufficient documentation

## 2024-09-17 DIAGNOSIS — M47812 Spondylosis without myelopathy or radiculopathy, cervical region: Secondary | ICD-10-CM | POA: Diagnosis not present

## 2024-09-17 DIAGNOSIS — Z7902 Long term (current) use of antithrombotics/antiplatelets: Secondary | ICD-10-CM | POA: Insufficient documentation

## 2024-09-17 DIAGNOSIS — R221 Localized swelling, mass and lump, neck: Secondary | ICD-10-CM | POA: Diagnosis not present

## 2024-09-17 DIAGNOSIS — R509 Fever, unspecified: Secondary | ICD-10-CM | POA: Diagnosis not present

## 2024-09-17 DIAGNOSIS — E236 Other disorders of pituitary gland: Secondary | ICD-10-CM | POA: Diagnosis not present

## 2024-09-17 DIAGNOSIS — Z91010 Allergy to peanuts: Secondary | ICD-10-CM | POA: Insufficient documentation

## 2024-09-17 DIAGNOSIS — J029 Acute pharyngitis, unspecified: Secondary | ICD-10-CM | POA: Diagnosis not present

## 2024-09-17 DIAGNOSIS — E119 Type 2 diabetes mellitus without complications: Secondary | ICD-10-CM | POA: Insufficient documentation

## 2024-09-17 DIAGNOSIS — R070 Pain in throat: Secondary | ICD-10-CM | POA: Diagnosis not present

## 2024-09-17 DIAGNOSIS — R059 Cough, unspecified: Secondary | ICD-10-CM | POA: Diagnosis not present

## 2024-09-17 LAB — URINALYSIS, ROUTINE W REFLEX MICROSCOPIC
Bilirubin Urine: NEGATIVE
Glucose, UA: NEGATIVE mg/dL
Hgb urine dipstick: NEGATIVE
Ketones, ur: NEGATIVE mg/dL
Leukocytes,Ua: NEGATIVE
Nitrite: NEGATIVE
Protein, ur: NEGATIVE mg/dL
Specific Gravity, Urine: 1.033 — ABNORMAL HIGH (ref 1.005–1.030)
pH: 8 (ref 5.0–8.0)

## 2024-09-17 LAB — CBC WITH DIFFERENTIAL/PLATELET
Abs Immature Granulocytes: 0.04 K/uL (ref 0.00–0.07)
Basophils Absolute: 0.1 K/uL (ref 0.0–0.1)
Basophils Relative: 1 %
Eosinophils Absolute: 0.3 K/uL (ref 0.0–0.5)
Eosinophils Relative: 3 %
HCT: 51.1 % (ref 39.0–52.0)
Hemoglobin: 17.3 g/dL — ABNORMAL HIGH (ref 13.0–17.0)
Immature Granulocytes: 0 %
Lymphocytes Relative: 14 %
Lymphs Abs: 1.3 K/uL (ref 0.7–4.0)
MCH: 28.8 pg (ref 26.0–34.0)
MCHC: 33.9 g/dL (ref 30.0–36.0)
MCV: 85 fL (ref 80.0–100.0)
Monocytes Absolute: 1 K/uL (ref 0.1–1.0)
Monocytes Relative: 10 %
Neutro Abs: 6.9 K/uL (ref 1.7–7.7)
Neutrophils Relative %: 72 %
Platelets: 221 K/uL (ref 150–400)
RBC: 6.01 MIL/uL — ABNORMAL HIGH (ref 4.22–5.81)
RDW: 14.1 % (ref 11.5–15.5)
WBC: 9.6 K/uL (ref 4.0–10.5)
nRBC: 0 % (ref 0.0–0.2)

## 2024-09-17 LAB — COMPREHENSIVE METABOLIC PANEL WITH GFR
ALT: 20 U/L (ref 0–44)
AST: 22 U/L (ref 15–41)
Albumin: 3.8 g/dL (ref 3.5–5.0)
Alkaline Phosphatase: 71 U/L (ref 38–126)
Anion gap: 14 (ref 5–15)
BUN: 10 mg/dL (ref 8–23)
CO2: 23 mmol/L (ref 22–32)
Calcium: 8.7 mg/dL — ABNORMAL LOW (ref 8.9–10.3)
Chloride: 98 mmol/L (ref 98–111)
Creatinine, Ser: 1.11 mg/dL (ref 0.61–1.24)
GFR, Estimated: >60 mL/min (ref >60)
Glucose, Bld: 79 mg/dL (ref 70–99)
Potassium: 3.8 mmol/L (ref 3.5–5.1)
Sodium: 135 mmol/L (ref 135–145)
Total Bilirubin: 1.1 mg/dL (ref 0.0–1.2)
Total Protein: 6.8 g/dL (ref 6.5–8.1)

## 2024-09-17 LAB — GROUP A STREP BY PCR: Group A Strep by PCR: NOT DETECTED

## 2024-09-17 LAB — RESP PANEL BY RT-PCR (RSV, FLU A&B, COVID)  RVPGX2
Influenza A by PCR: NEGATIVE
Influenza B by PCR: NEGATIVE
Resp Syncytial Virus by PCR: NEGATIVE
SARS Coronavirus 2 by RT PCR: NEGATIVE

## 2024-09-17 LAB — TROPONIN I (HIGH SENSITIVITY)
Troponin I (High Sensitivity): 9 ng/L (ref <18)
Troponin I (High Sensitivity): 9 ng/L (ref <18)

## 2024-09-17 LAB — LACTIC ACID, PLASMA
Lactic Acid, Venous: 1.2 mmol/L (ref 0.5–1.9)
Lactic Acid, Venous: 1.5 mmol/L (ref 0.5–1.9)

## 2024-09-17 MED ORDER — CLINDAMYCIN PHOSPHATE 600 MG/50ML IV SOLN
600.0000 mg | Freq: Once | INTRAVENOUS | Status: AC
  Administered 2024-09-17: 600 mg via INTRAVENOUS
  Filled 2024-09-17: qty 50

## 2024-09-17 MED ORDER — IOHEXOL 350 MG/ML SOLN
75.0000 mL | Freq: Once | INTRAVENOUS | Status: AC | PRN
Start: 2024-09-17 — End: 2024-09-17
  Administered 2024-09-17: 75 mL via INTRAVENOUS

## 2024-09-17 MED ORDER — CLINDAMYCIN HCL 300 MG PO CAPS: 300.0000 mg | ORAL_CAPSULE | Freq: Three times a day (TID) | ORAL | 0 refills | Status: AC

## 2024-09-17 MED ORDER — LACTATED RINGERS IV BOLUS
1000.0000 mL | Freq: Once | INTRAVENOUS | Status: AC
  Administered 2024-09-17: 1000 mL via INTRAVENOUS

## 2024-09-17 MED ORDER — HYDROCORTISONE SOD SUC (PF) 100 MG IJ SOLR
100.0000 mg | Freq: Once | INTRAMUSCULAR | Status: DC
  Filled 2024-09-17: qty 2

## 2024-09-17 MED ORDER — DEXAMETHASONE SOD PHOSPHATE PF 10 MG/ML IJ SOLN
10.0000 mg | Freq: Once | INTRAMUSCULAR | Status: AC
  Administered 2024-09-17: 10 mg via INTRAVENOUS

## 2024-09-17 NOTE — ED Notes (Signed)
 Pt. Verbalized that he felt ready to go home; EDP at [facility] whenever I was getting VS, EDP talked with daughter and updated her; Gisele is coming to get him.

## 2024-09-17 NOTE — Discharge Instructions (Addendum)
 Take your normal medications and steroids as previously directed. Use Tylenol  every 4 hours as needed for pain or fever. If you feel worse you may need to take an extra dose of your hydrocortisone  call your doctor. For further concerns with your throat call your physician or ear nose and throat doctor above. Return for breathing difficulty or new concerns.

## 2024-09-17 NOTE — ED Notes (Signed)
 Patient transported to CT; No changes

## 2024-09-17 NOTE — ED Provider Notes (Signed)
 Patient care signed out to follow-up neck imaging and reassess.  Patient present feeling malaise and sore throat.  No breathing difficulty low-grade temperature 100.9 degrees.  Patient given IV fluids.  On reassessment patient says he feels improved and comfortable going back to assisted living.  CT scan results independently reviewed known pituitary abnormality, mild edema without abscess.  Strep test negative.  Blood work reassuring.  Patient received dose of steroids IV, history of adrenal insufficiency and takes daily medications.  Discussed with patient's daughter in detail they are assisting in working with nursing for ride back to assisted living.  Discussed reasons to return.   Tonia Chew, MD 09/17/24 [phone]

## 2024-09-17 NOTE — ED Provider Notes (Signed)
 [employer] EMERGENCY DEPARTMENT AT [facility] Provider Note   CSN: [id number] Arrival date & time: 09/17/24  9452     Patient presents with: Sore Throat   Alexander Yoder is a 89 y.o. male.   Patient with a history of glaucoma, diabetes insipidus, hyperlipidemia presents with feeling uneasy.  States he feels sick and is having difficulty elaborating.  Has been having a sore throat for the past 2 days and his wife has had a sore throat as well.  Having some nausea and feeling uneasy in his stomach but no vomiting.  No diarrhea.  Denies chest pain or shortness of breath.  No difficulty breathing or difficulty swallowing.  No known fever.  EMS reports temperature of 100.9.  Denies any body aches or chills.  No pain with urination or blood in the urine. No significant difficulty breathing or difficulty swallowing.  No significant headache.  Complains of feeling something stuck in his throat unable to clear it0   Sore Throat Pertinent negatives include no chest pain, no abdominal pain and no shortness of breath.       Prior to Admission medications   Medication Sig Start Date End Date Taking? Authorizing Provider  acetaminophen  (TYLENOL ) 325 MG tablet Take 2 tablets (650 mg total) by mouth every 6 (six) hours as needed for mild pain (or Fever >/= 101). 09/10/22   [name], Brayton RAMAN, MD  albuterol  (VENTOLIN  HFA) 108 (90 Base) MCG/ACT inhaler Inhale 2 puffs into the lungs every 6 (six) hours as needed for wheezing or shortness of breath. 01/21/23   Singh, Prashant K, MD  aspirin  EC 81 MG tablet Take 81 mg by mouth daily.    [provider]  B Complex-C (B-COMPLEX WITH VITAMIN C) tablet Take 1 tablet by mouth daily.    [provider]  brimonidine  (ALPHAGAN ) 0.15 % ophthalmic solution Place 1 drop into the right eye 2 (two) times daily.    [provider]  Cholecalciferol  (VITAMIN D ) 50 MCG (2000 UT) CAPS Take 2,000 Units by mouth daily.    [provider]  clopidogrel  (PLAVIX ) 75 MG tablet Take 75 mg by mouth daily. 03/29/21   [provider]  COMBIGAN  0.2-0.5 % ophthalmic solution Place 1 drop into the left eye 2 (two) times daily. 08/20/22   [provider]  desmopressin  (DDAVP ) 0.1 MG tablet Take 1 tablet (0.1 mg total) by mouth 2 (two) times daily. 09/10/22   [name], Brayton RAMAN, MD  HYDROcodone -acetaminophen  (NORCO) 7.5-325 MG tablet Take 1 tablet by mouth every 6 (six) hours as needed for moderate pain (pain score 4-6) or severe pain (pain score 7-10). 05/11/24   [provider]  hydrocortisone  (CORTEF ) 10 MG tablet Take  2 tablets (20mg ) by mouth in the morning, and 1 tablet (10mg ) in the evening. Patient taking differently: Take 10-20 mg by mouth daily. Take  2 tablets (20mg ) by mouth in the morning, and 1 tablet (10mg ) in the evening. 01/27/23   Singh, Prashant K, MD  latanoprost  (XALATAN ) 0.005 % ophthalmic solution Place 1 drop into both eyes at bedtime.    [provider]  levothyroxine  (SYNTHROID ) 112 MCG tablet Take 112 mcg by mouth daily before breakfast.    [provider]  loperamide  (IMODIUM ) 2 MG capsule Take 2 capsules (4 mg total) by mouth as needed for diarrhea or loose stools. 05/16/24   Sheikh, Omair Latif, DO  Omega-3 Fatty Acids (FISH OIL) 1000 MG CAPS Take 1,000 mg by mouth  daily.    [provider]  ondansetron  (ZOFRAN -ODT) 4 MG disintegrating tablet Take 1 tablet (4 mg total) by mouth every 8 (eight) hours as needed for nausea or vomiting. 05/16/24   Sherrill Cable Latif, DO  saccharomyces boulardii (FLORASTOR) 250 MG capsule Take 1 capsule (250 mg total) by mouth 2 (two) times daily. 05/16/24   Sherrill Cable Latif, DO  simethicone  (MYLICON) 80 MG chewable tablet Chew 1 tablet (80 mg total) by mouth every 6 (six) hours as needed for flatulence. 05/16/24   Sherrill Cable Latif, DO  testosterone  cypionate (DEPOTESTOSTERONE CYPIONATE) 200 MG/ML injection Inject 200 mg  into the muscle every 14 (fourteen) days.    [provider]  Turmeric (QC TUMERIC COMPLEX PO) Take 1 capsule by mouth daily.    [provider]    Allergies: Peanut-containing drug products, Penicillins, and Rosuvastatin calcium     Review of Systems  Constitutional:  Positive for fever. Negative for activity change and appetite change.  HENT:  Positive for congestion, sore throat and trouble swallowing.   Respiratory:  Negative for cough, chest tightness and shortness of breath.   Cardiovascular:  Negative for chest pain.  Gastrointestinal:  Negative for abdominal pain, nausea and vomiting.  Genitourinary:  Negative for dysuria and hematuria.  Musculoskeletal:  Negative for arthralgias and myalgias.  Skin:  Negative for rash.  Neurological:  Negative for dizziness, weakness and light-headedness.    all other systems are negative except as noted in the HPI and PMH.   Updated Vital Signs BP (!) 140/81 (BP Location: Left Arm)   Pulse 80   Temp 98.6 F (37 C) (Oral)   Resp 16   Ht 5' 9 (1.753 m)   Wt 83 kg   SpO2 100%   BMI 27.02 kg/m   Physical Exam Vitals and nursing note reviewed.  Constitutional:      General: He is not in acute distress.    Appearance: He is well-developed.     Comments: Uncomfortable  HENT:     Head: Normocephalic and atraumatic.     Mouth/Throat:     Pharynx: Oropharyngeal exudate and posterior oropharyngeal erythema present.     Comments: Controlling secretions, no stridor, enlarged uvula appears midline. Eyes:     Conjunctiva/sclera: Conjunctivae normal.     Pupils: Pupils are equal, round, and reactive to light.  Neck:     Comments: No meningismus. Cardiovascular:     Rate and Rhythm: Normal rate and regular rhythm.     Heart sounds: Normal heart sounds. No murmur heard. Pulmonary:     Effort: Pulmonary effort is normal. No respiratory distress.     Breath sounds: Normal breath sounds.  Abdominal:     Palpations: Abdomen  is soft.     Tenderness: There is no abdominal tenderness. There is no guarding or rebound.  Musculoskeletal:        General: No tenderness. Normal range of motion.     Cervical back: Normal range of motion and neck supple.  Skin:    General: Skin is warm.  Neurological:     Mental Status: He is alert and oriented to person, place, and time.     Cranial Nerves: No cranial nerve deficit.     Motor: No abnormal muscle tone.     Coordination: Coordination normal.     Comments: No ataxia on finger to nose bilaterally. No pronator drift. 5/5 strength throughout. CN 2-12 intact.Equal grip strength. Sensation intact.   Psychiatric:  Behavior: Behavior normal.     (all labs ordered are listed, but only abnormal results are displayed) Labs Reviewed  CBC WITH DIFFERENTIAL/PLATELET - Abnormal; Notable for the following components:      Result Value   RBC 6.01 (*)    Hemoglobin 17.3 (*)    All other components within normal limits  COMPREHENSIVE METABOLIC PANEL WITH GFR - Abnormal; Notable for the following components:   Calcium  8.7 (*)    All other components within normal limits  GROUP A STREP BY PCR  RESP PANEL BY RT-PCR (RSV, FLU A&B, COVID)  [id number]  CULTURE, BLOOD (ROUTINE X 2)  CULTURE, BLOOD (ROUTINE X 2)  LACTIC ACID, PLASMA  LACTIC ACID, PLASMA  URINALYSIS, ROUTINE W REFLEX MICROSCOPIC  TROPONIN I (HIGH SENSITIVITY)  TROPONIN I (HIGH SENSITIVITY)    EKG: None  Radiology: No results found.   Procedures   Medications Ordered in the ED - No data to display  Clinical Course as of 09/17/24 0721  Thu Sep 17, 2024  0706 S. 89y M, p/w uneasy n stomach nauseous but not vomiting. Uvula is very swollen - likely uvelitis. Febrile with EMS. Airway is not compromised.  Decadron  (on solucortef chronically).  []  pending CT scan []  pending XR  [SA]    Clinical Course User Index [SA] Billy Pal, MD                                 Medical Decision Making Amount  and/or Complexity of Data Reviewed Independent Historian: EMS Labs: ordered. Decision-making details documented in ED Course. Radiology: ordered and independent interpretation performed. Decision-making details documented in ED Course. ECG/medicine tests: ordered and independent interpretation performed. Decision-making details documented in ED Course.  Risk Prescription drug management.   2 days of sore throat, nausea, feeling unwell.  Febrile for EMS but afebrile on arrival.  Stable vital signs.  Controlling secretions.  No stridor.  Will obtain basic labs, chest x-ray, soft tissue neck x-ray.  Empiric Decadron  given.  Concern for uvulitis.  Decadron  given and antibiotics given.   Labs pending at shift change. Will also obtain Chest xray and soft tissue neck xray.  May require CT neck for further assessment  Care to be transferred at shift change to Dr. Tonia.      Final diagnoses:  None    ED Discharge Orders     None          Venora Kautzman, Garnette, MD 09/17/24 [phone]

## 2024-09-17 NOTE — ED Notes (Signed)
 Son, Shaw Dobek (# in chart) called and would like an update as soon as one becomes available.

## 2024-09-17 NOTE — ED Triage Notes (Signed)
 Pt BIB [facility] from [facility] d/t sore throat and nausea for 2 days.    BP 110/80 Temp 100.9 HR 76 RR 16

## 2024-09-22 LAB — CULTURE, BLOOD (ROUTINE X 2)
Culture: NO GROWTH
Culture: NO GROWTH

## 2024-09-25 DIAGNOSIS — R059 Cough, unspecified: Secondary | ICD-10-CM | POA: Diagnosis not present

## 2024-10-08 DIAGNOSIS — E232 Diabetes insipidus: Secondary | ICD-10-CM | POA: Diagnosis not present

## 2024-10-08 DIAGNOSIS — D497 Neoplasm of unspecified behavior of endocrine glands and other parts of nervous system: Secondary | ICD-10-CM | POA: Diagnosis not present

## 2024-10-08 DIAGNOSIS — Z125 Encounter for screening for malignant neoplasm of prostate: Secondary | ICD-10-CM | POA: Diagnosis not present

## 2024-10-08 DIAGNOSIS — E230 Hypopituitarism: Secondary | ICD-10-CM | POA: Diagnosis not present

## 2024-10-22 DIAGNOSIS — I5032 Chronic diastolic (congestive) heart failure: Secondary | ICD-10-CM | POA: Diagnosis not present

## 2024-10-22 DIAGNOSIS — Z1331 Encounter for screening for depression: Secondary | ICD-10-CM | POA: Diagnosis not present

## 2024-10-22 DIAGNOSIS — E230 Hypopituitarism: Secondary | ICD-10-CM | POA: Diagnosis not present

## 2024-10-22 DIAGNOSIS — E2740 Unspecified adrenocortical insufficiency: Secondary | ICD-10-CM | POA: Diagnosis not present

## 2024-10-22 DIAGNOSIS — D751 Secondary polycythemia: Secondary | ICD-10-CM | POA: Diagnosis not present

## 2024-10-22 DIAGNOSIS — I110 Hypertensive heart disease with heart failure: Secondary | ICD-10-CM | POA: Diagnosis not present

## 2024-10-22 DIAGNOSIS — Z7982 Long term (current) use of aspirin: Secondary | ICD-10-CM | POA: Diagnosis not present

## 2024-10-22 DIAGNOSIS — Z7902 Long term (current) use of antithrombotics/antiplatelets: Secondary | ICD-10-CM | POA: Diagnosis not present

## 2024-10-22 DIAGNOSIS — Z0000 Encounter for general adult medical examination without abnormal findings: Secondary | ICD-10-CM | POA: Diagnosis not present

## 2024-10-22 DIAGNOSIS — N1831 Chronic kidney disease, stage 3a: Secondary | ICD-10-CM | POA: Diagnosis not present

## 2024-10-22 DIAGNOSIS — D497 Neoplasm of unspecified behavior of endocrine glands and other parts of nervous system: Secondary | ICD-10-CM | POA: Diagnosis not present

## 2024-10-22 DIAGNOSIS — E232 Diabetes insipidus: Secondary | ICD-10-CM | POA: Diagnosis not present

## 2024-10-22 DIAGNOSIS — Z5181 Encounter for therapeutic drug level monitoring: Secondary | ICD-10-CM | POA: Diagnosis not present

## 2024-10-22 DIAGNOSIS — Z9181 History of falling: Secondary | ICD-10-CM | POA: Diagnosis not present

## 2024-12-17 ENCOUNTER — Ambulatory Visit: Admitting: Podiatry

## 2024-12-22 ENCOUNTER — Ambulatory Visit: Admitting: Podiatry

## 2024-12-22 ENCOUNTER — Encounter: Payer: Self-pay | Admitting: Podiatry

## 2024-12-22 DIAGNOSIS — B351 Tinea unguium: Secondary | ICD-10-CM

## 2024-12-22 NOTE — Progress Notes (Unsigned)
 He presents today chief complaint of painful elongated toenails hallux nails really been bothering him for the past 6 to 8 weeks.  He says they do seem to be gradually getting worse.  Objective: Vital signs are stable he is alert oriented x 3.  Pulses are palpable.  Toenails 1 through 5 bilateral are thick yellow dystrophic like mycotic painful palpation as well as debridement hallux nails are particularly worse with subungual debris and malodor.  They are significantly darker.  Assessment: Pain in limb secondary to onychomycosis bilateral.  Plan: Debridement of toenails 1 through 5 bilateral covered service secondary to pain follow-up with him in about 6 months to help maintain thickness.

## 2025-06-22 ENCOUNTER — Ambulatory Visit: Admitting: Podiatry
